# Patient Record
Sex: Male | Born: 1950 | Hispanic: No | Marital: Married | Smoking: Former smoker
Health system: Southern US, Community
[De-identification: ages and names within clinical notes are randomized; demographics above are authoritative.]

## PROBLEM LIST (undated history)

## (undated) DIAGNOSIS — F419 Anxiety disorder, unspecified: Secondary | ICD-10-CM

## (undated) DIAGNOSIS — I10 Essential (primary) hypertension: Secondary | ICD-10-CM

## (undated) DIAGNOSIS — E785 Hyperlipidemia, unspecified: Secondary | ICD-10-CM

## (undated) DIAGNOSIS — I639 Cerebral infarction, unspecified: Secondary | ICD-10-CM

## (undated) DIAGNOSIS — J449 Chronic obstructive pulmonary disease, unspecified: Secondary | ICD-10-CM

## (undated) DIAGNOSIS — T7840XA Allergy, unspecified, initial encounter: Secondary | ICD-10-CM

## (undated) DIAGNOSIS — M1990 Unspecified osteoarthritis, unspecified site: Secondary | ICD-10-CM

## (undated) HISTORY — DX: Cerebral infarction, unspecified: I63.9

## (undated) HISTORY — DX: Hyperlipidemia, unspecified: E78.5

## (undated) HISTORY — DX: Allergy, unspecified, initial encounter: T78.40XA

## (undated) HISTORY — DX: Unspecified osteoarthritis, unspecified site: M19.90

## (undated) HISTORY — DX: Anxiety disorder, unspecified: F41.9

## (undated) HISTORY — PX: FOOT SURGERY: SHX648

## (undated) HISTORY — DX: Essential (primary) hypertension: I10

## (undated) HISTORY — DX: Chronic obstructive pulmonary disease, unspecified: J44.9

---

## 1999-01-02 ENCOUNTER — Emergency Department (HOSPITAL_COMMUNITY): Admission: EM | Admit: 1999-01-02 | Discharge: 1999-01-02 | Payer: Self-pay | Admitting: Emergency Medicine

## 1999-01-22 ENCOUNTER — Emergency Department (HOSPITAL_COMMUNITY): Admission: EM | Admit: 1999-01-22 | Discharge: 1999-01-22 | Payer: Self-pay | Admitting: Emergency Medicine

## 1999-09-11 ENCOUNTER — Encounter: Payer: Self-pay | Admitting: Pulmonary Disease

## 1999-09-11 ENCOUNTER — Ambulatory Visit (HOSPITAL_COMMUNITY): Admission: RE | Admit: 1999-09-11 | Discharge: 1999-09-11 | Payer: Self-pay | Admitting: Pulmonary Disease

## 1999-10-02 ENCOUNTER — Ambulatory Visit (HOSPITAL_COMMUNITY): Admission: RE | Admit: 1999-10-02 | Discharge: 1999-10-02 | Payer: Self-pay | Admitting: Pulmonary Disease

## 1999-10-02 ENCOUNTER — Encounter: Payer: Self-pay | Admitting: Pulmonary Disease

## 1999-10-20 ENCOUNTER — Encounter (INDEPENDENT_AMBULATORY_CARE_PROVIDER_SITE_OTHER): Payer: Self-pay | Admitting: *Deleted

## 1999-10-20 ENCOUNTER — Ambulatory Visit (HOSPITAL_BASED_OUTPATIENT_CLINIC_OR_DEPARTMENT_OTHER): Admission: RE | Admit: 1999-10-20 | Discharge: 1999-10-20 | Payer: Self-pay | Admitting: Urology

## 2000-05-28 ENCOUNTER — Encounter: Payer: Self-pay | Admitting: *Deleted

## 2000-05-28 ENCOUNTER — Emergency Department (HOSPITAL_COMMUNITY): Admission: EM | Admit: 2000-05-28 | Discharge: 2000-05-29 | Payer: Self-pay | Admitting: *Deleted

## 2000-06-25 ENCOUNTER — Encounter: Payer: Self-pay | Admitting: Pulmonary Disease

## 2000-06-25 ENCOUNTER — Ambulatory Visit (HOSPITAL_COMMUNITY): Admission: RE | Admit: 2000-06-25 | Discharge: 2000-06-25 | Payer: Self-pay | Admitting: Pulmonary Disease

## 2000-10-13 ENCOUNTER — Encounter: Admission: RE | Admit: 2000-10-13 | Discharge: 2000-11-24 | Payer: Self-pay | Admitting: Pulmonary Disease

## 2001-01-11 ENCOUNTER — Encounter: Admission: RE | Admit: 2001-01-11 | Discharge: 2001-01-12 | Payer: Self-pay | Admitting: Pulmonary Disease

## 2001-01-13 ENCOUNTER — Encounter: Admission: RE | Admit: 2001-01-13 | Discharge: 2001-01-28 | Payer: Self-pay | Admitting: Pulmonary Disease

## 2002-06-13 ENCOUNTER — Ambulatory Visit (HOSPITAL_COMMUNITY): Admission: RE | Admit: 2002-06-13 | Discharge: 2002-06-13 | Payer: Self-pay | Admitting: Pulmonary Disease

## 2002-06-13 ENCOUNTER — Encounter: Payer: Self-pay | Admitting: Pulmonary Disease

## 2005-04-22 ENCOUNTER — Ambulatory Visit: Payer: Self-pay

## 2007-01-03 ENCOUNTER — Emergency Department (HOSPITAL_COMMUNITY): Admission: EM | Admit: 2007-01-03 | Discharge: 2007-01-03 | Payer: Self-pay | Admitting: Emergency Medicine

## 2009-07-29 ENCOUNTER — Encounter: Admission: RE | Admit: 2009-07-29 | Discharge: 2009-07-29 | Payer: Self-pay | Admitting: Internal Medicine

## 2009-09-13 ENCOUNTER — Encounter: Admission: RE | Admit: 2009-09-13 | Discharge: 2009-09-13 | Payer: Self-pay | Admitting: Diagnostic Neuroimaging

## 2010-10-31 NOTE — Op Note (Signed)
[place]  Patient:    Travis Barajas                       MRN: [id number] Proc. Date: 10/20/99 Adm. Date:  [id number] Disc. Date: [id number] Attending:  Lindaann Slough                           Operative Report  PREOPERATIVE DIAGNOSIS:  Microhematuria.  POSTOPERATIVE DIAGNOSIS:  Microhematuria.  PROCEDURE PERFORMED:  Cystoscopy.  SURGEON:  Lindaann Slough, M.D.  ANESTHESIA:  General.  INDICATIONS:  The patient is a 60 year old male who was found on urinalysis to have microhematuria.  IVP and CT scan of the kidneys showed a diverticulum in the mid pole of the left kidney, but otherwise normal.  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  The patient is scheduled today for cystoscopy under anesthesia.  DESCRIPTION OF PROCEDURE:  Under general anesthesia, the patient was prepped and draped, and placed in the dorsal lithotomy position.  A #20 Wappler cystoscope was inserted in the bladder.  The urethra is normal.  There is no stone or tumor in the bladder.  The ureteral orifices are in normal position and shape with clear efflux.  There was no evidence of submucosal hemorrhage. The bladder was them emptied and irrigated with normal saline and bladder washings were sent for pathology.  The patient tolerated the procedure well and left the OR in satisfactory condition to the postanesthesia care unit. DD:  10/20/99 TD:  10/21/99 Job: [id number]

## 2011-03-14 ENCOUNTER — Ambulatory Visit (HOSPITAL_COMMUNITY)
Admission: RE | Admit: 2011-03-14 | Discharge: 2011-03-14 | Disposition: A | Discharge status: Home / Self Care | Payer: 59 | Source: Ambulatory Visit | Attending: Emergency Medicine | Admitting: Emergency Medicine

## 2011-03-14 ENCOUNTER — Other Ambulatory Visit: Payer: Self-pay | Admitting: Emergency Medicine

## 2011-03-14 DIAGNOSIS — R519 Headache, unspecified: Secondary | ICD-10-CM

## 2011-03-14 DIAGNOSIS — I672 Cerebral atherosclerosis: Secondary | ICD-10-CM | POA: Insufficient documentation

## 2011-03-14 DIAGNOSIS — R51 Headache: Secondary | ICD-10-CM | POA: Insufficient documentation

## 2011-03-16 ENCOUNTER — Other Ambulatory Visit: Payer: Self-pay | Admitting: Orthopedic Surgery

## 2011-03-16 DIAGNOSIS — M542 Cervicalgia: Secondary | ICD-10-CM

## 2011-03-17 ENCOUNTER — Ambulatory Visit
Admission: RE | Admit: 2011-03-17 | Discharge: 2011-03-17 | Disposition: A | Discharge status: Home / Self Care | Payer: 59 | Source: Ambulatory Visit | Attending: Orthopedic Surgery | Admitting: Orthopedic Surgery

## 2011-03-17 DIAGNOSIS — M542 Cervicalgia: Secondary | ICD-10-CM

## 2011-03-30 LAB — ETHANOL: Alcohol, Ethyl (B): 181 — ABNORMAL HIGH

## 2011-04-10 LAB — PSA: PSA: 1.56

## 2011-04-14 ENCOUNTER — Other Ambulatory Visit (HOSPITAL_COMMUNITY): Payer: Self-pay | Admitting: Gastroenterology

## 2011-05-06 ENCOUNTER — Encounter (HOSPITAL_COMMUNITY)
Admission: RE | Admit: 2011-05-06 | Discharge: 2011-05-06 | Disposition: A | Discharge status: Home / Self Care | Payer: 59 | Source: Ambulatory Visit | Attending: Gastroenterology | Admitting: Gastroenterology

## 2011-05-06 DIAGNOSIS — R109 Unspecified abdominal pain: Secondary | ICD-10-CM | POA: Insufficient documentation

## 2011-05-06 MED ORDER — TECHNETIUM TC 99M SULFUR COLLOID
2.1000 | Freq: Once | INTRAVENOUS | Status: AC | PRN
  Administered 2011-05-06: 2.1 via INTRAVENOUS

## 2011-05-21 ENCOUNTER — Ambulatory Visit (INDEPENDENT_AMBULATORY_CARE_PROVIDER_SITE_OTHER): Payer: 59

## 2011-05-21 DIAGNOSIS — L02519 Cutaneous abscess of unspecified hand: Secondary | ICD-10-CM

## 2011-05-21 DIAGNOSIS — M25449 Effusion, unspecified hand: Secondary | ICD-10-CM

## 2011-05-26 ENCOUNTER — Ambulatory Visit (INDEPENDENT_AMBULATORY_CARE_PROVIDER_SITE_OTHER): Payer: 59 | Admitting: Emergency Medicine

## 2011-05-26 DIAGNOSIS — J159 Unspecified bacterial pneumonia: Secondary | ICD-10-CM

## 2011-05-26 DIAGNOSIS — R1084 Generalized abdominal pain: Secondary | ICD-10-CM

## 2011-06-05 ENCOUNTER — Ambulatory Visit (INDEPENDENT_AMBULATORY_CARE_PROVIDER_SITE_OTHER): Payer: 59

## 2011-06-05 DIAGNOSIS — R05 Cough: Secondary | ICD-10-CM

## 2011-06-05 DIAGNOSIS — J9801 Acute bronchospasm: Secondary | ICD-10-CM

## 2011-06-05 DIAGNOSIS — R059 Cough, unspecified: Secondary | ICD-10-CM

## 2011-06-05 DIAGNOSIS — M546 Pain in thoracic spine: Secondary | ICD-10-CM

## 2011-07-23 ENCOUNTER — Ambulatory Visit (INDEPENDENT_AMBULATORY_CARE_PROVIDER_SITE_OTHER): Payer: 59 | Admitting: Family Medicine

## 2011-07-23 ENCOUNTER — Encounter: Payer: Self-pay | Admitting: Family Medicine

## 2011-07-23 VITALS — BP 174/97 | HR 67 | Temp 97.2°F | Resp 16 | Ht 65.0 in | Wt 149.0 lb

## 2011-07-23 DIAGNOSIS — K219 Gastro-esophageal reflux disease without esophagitis: Secondary | ICD-10-CM

## 2011-07-23 DIAGNOSIS — J309 Allergic rhinitis, unspecified: Secondary | ICD-10-CM | POA: Insufficient documentation

## 2011-07-23 DIAGNOSIS — M2550 Pain in unspecified joint: Secondary | ICD-10-CM

## 2011-07-23 DIAGNOSIS — I10 Essential (primary) hypertension: Secondary | ICD-10-CM

## 2011-07-23 NOTE — Progress Notes (Addendum)
  Subjective:    Patient ID: Travis Barajas, male    DOB: [birth year], 61 y.o.   MRN: [id number]  HPI    This 61 y.o Travis Barajas male has malignant Hypertension and had a recent change of medications  by Dr. Jacinto Halim . The pt now has the correct medications as of yesterday. He was having headaches in frontal   area with a little lightheadedness. He is on several chronic meds (a number of them for GERD).  He notes that he has some work-related stress and   Review of Systems  Constitutional: Negative.   Cardiovascular: Negative.   Neurological: Positive for dizziness, light-headedness and headaches. Negative for weakness.       Objective:   Physical Exam  Vitals reviewed. Constitutional: He is oriented to person, place, and time. He appears well-developed and well-nourished.  HENT:  Head: Normocephalic and atraumatic.  Cardiovascular: Normal rate.   Pulmonary/Chest: Effort normal.  Neurological: He is alert and oriented to person, place, and time. No cranial nerve deficit. Coordination normal.  Psychiatric: He has a normal mood and affect. His behavior is normal. Judgment normal.    Blood Pressure Readings at Work:   07/22/2011    R arm:  192/116     p 98                                                                                   L arm :  180/106 BP last  PM:     146/88    Assessment & Plan:  Hypertension- Malignant : continue current medications. Pt is given an excuse from work , today                                           through Tuesday, 07/28/2011, at which time he will return for BP                                           re-check. He will be compliant with all his meds and reduce stress                                            with adequate rest over the next several days.

## 2011-07-28 ENCOUNTER — Encounter: Payer: Self-pay | Admitting: Family Medicine

## 2011-07-28 ENCOUNTER — Ambulatory Visit (INDEPENDENT_AMBULATORY_CARE_PROVIDER_SITE_OTHER): Payer: 59 | Admitting: Family Medicine

## 2011-07-28 VITALS — BP 161/90 | HR 71 | Temp 97.5°F | Resp 16 | Ht 65.0 in | Wt 149.2 lb

## 2011-07-28 DIAGNOSIS — I10 Essential (primary) hypertension: Secondary | ICD-10-CM

## 2011-07-28 MED ORDER — LISINOPRIL-HYDROCHLOROTHIAZIDE 20-25 MG PO TABS: 1.0000 | ORAL_TABLET | Freq: Every day | ORAL | Status: DC

## 2011-07-28 NOTE — Patient Instructions (Signed)
Today, we discussed some changes in your nutritional habits to help reduce your Blood Pressure; namely, caffeine reduction and tobacco use.   Lisinopril- HCTZ 20-25 mg is your new medication dose. Take one tablet daily along with your other medications.

## 2011-07-28 NOTE — Progress Notes (Signed)
  Subjective:    Patient ID: Travis Barajas, male    DOB: 10/08/1950, 61 y.o.   MRN: [id number]  HPI   This 61 y.o.man returns for BP recheck. He feels better since having a few days off and has been  compliant with medications. He does not observe the healthiest diet and consumes too much caffeine  and continues to smoke 1/2 pack daily. He has smoked for 40 years.    Review of Systems  Constitutional: Negative.   Respiratory: Negative.   Cardiovascular: Negative.   Neurological: Negative.        Objective:   Physical Exam  Constitutional: He is oriented to person, place, and time. He appears well-developed and well-nourished. No distress.  HENT:  Head: Normocephalic and atraumatic.  Cardiovascular: Normal rate.   Pulmonary/Chest: Effort normal.  Neurological: He is alert and oriented to person, place, and time. No cranial nerve deficit.  Psychiatric: He has a normal mood and affect.          Assessment & Plan:   1. HTN (hypertension), malignant  lisinopril-hydrochlorothiazide (PRINZIDE,ZESTORETIC) 20-25 MG per tablet - this is your new medication dose. Continune all other meds . RTC on 07/30/2010 for BP recheck.

## 2011-07-31 ENCOUNTER — Encounter: Payer: Self-pay | Admitting: Family Medicine

## 2011-07-31 ENCOUNTER — Ambulatory Visit (INDEPENDENT_AMBULATORY_CARE_PROVIDER_SITE_OTHER): Payer: 59 | Admitting: Family Medicine

## 2011-07-31 VITALS — BP 185/103 | HR 92 | Temp 98.5°F | Resp 20 | Ht 65.0 in | Wt 149.6 lb

## 2011-07-31 DIAGNOSIS — I10 Essential (primary) hypertension: Secondary | ICD-10-CM

## 2011-07-31 DIAGNOSIS — Z720 Tobacco use: Secondary | ICD-10-CM

## 2011-07-31 NOTE — Progress Notes (Signed)
  Subjective:    Patient ID: Travis Barajas, male    DOB: September 17, 1950, 61 y.o.   MRN: [id number]  HPI   The pt returns for BP check. He is taking his medication but has chronic neck pain for which  he takes HC/APAP prescribed by Dr. Buford Dresser, a Spine Specialist. He has been receiving injections  and is due to return to him on Monday- 08/03/11. His took 2 Lisinopril/HCTZ 20-25 tabs this morning  at his wife's insistence. He admits that he snacks on foods that may contain salt; he will try to get  some fruit for snacking instead. He is a smoker but will work on reduction.    Review of Systems   Persistent neck pain; otherwise negative.     Objective:   Physical Exam  Vitals reviewed. Constitutional: He is oriented to person, place, and time. He appears well-developed and well-nourished. No distress.  HENT:  Head: Normocephalic and atraumatic.  Cardiovascular: Normal rate.   Pulmonary/Chest: Effort normal.  Neurological: He is alert and oriented to person, place, and time. No cranial nerve deficit.  Skin: Skin is warm and dry.  Psychiatric: He has a normal mood and affect. His behavior is normal.          Assessment & Plan:  HTN- Increase Metoprolol 50 mg to 1 1/2 tablets twice a day.            RTC 10 days for re-check.  Continue to monitor BP at home.           Continue all other meds as prescribed.

## 2011-07-31 NOTE — Patient Instructions (Addendum)
Your BP medication - Metoprolol 50 mg has been increased to  1 1/2 tablets twice a day. Continue all your other medications. RTC in 10 days for re-check.  Continue to reduce tobacco use. Try E-cigarettes.

## 2011-08-05 ENCOUNTER — Telehealth: Payer: Self-pay

## 2011-08-05 DIAGNOSIS — Z0271 Encounter for disability determination: Secondary | ICD-10-CM

## 2011-08-05 NOTE — Telephone Encounter (Signed)
Requests hand specialist referral. Alt [phone]

## 2011-08-06 NOTE — Telephone Encounter (Signed)
Pt has a follow-up with me to re-evaluate his BP. I can address this at that visit and will have better documentation to share with the specialist.

## 2011-08-06 NOTE — Telephone Encounter (Signed)
Left message stating that when he comes back to f/u with [facility] specialist would be further discussed so there would be more documentation. To CB if he has any questions til then.

## 2011-08-11 ENCOUNTER — Encounter: Payer: Self-pay | Admitting: Family Medicine

## 2011-08-11 ENCOUNTER — Ambulatory Visit (INDEPENDENT_AMBULATORY_CARE_PROVIDER_SITE_OTHER): Payer: 59 | Admitting: Family Medicine

## 2011-08-11 VITALS — BP 147/83 | HR 95 | Temp 97.2°F | Resp 16 | Ht 65.0 in | Wt 153.2 lb

## 2011-08-11 DIAGNOSIS — M1990 Unspecified osteoarthritis, unspecified site: Secondary | ICD-10-CM

## 2011-08-11 DIAGNOSIS — I10 Essential (primary) hypertension: Secondary | ICD-10-CM

## 2011-08-11 NOTE — Patient Instructions (Signed)
Osteoarthritis Osteoarthritis is the most common form of arthritis. It is redness, soreness, and swelling (inflammation) affecting the cartilage. Cartilage acts as a cushion, covering the ends of bones where they meet to form a joint. CAUSES  Over time, the cartilage begins to wear away. This causes bone to rub on bone. This produces pain and stiffness in the affected joints. Factors that contribute to this problem are:  Excessive body weight.   Age.   Overuse of joints.  SYMPTOMS   People with osteoarthritis usually experience joint pain, swelling, or stiffness.   Over time, the joint may lose its normal shape.   Small deposits of bone (osteophytes) may grow on the edges of the joint.   Bits of bone or cartilage can break off and float inside the joint space. This may cause more pain and damage.   Osteoarthritis can lead to depression, anxiety, feelings of helplessness, and limitations on daily activities.  The most commonly affected joints are in the:  Ends of the fingers.   Thumbs.   Neck.   Lower back.   Knees.   Hips.  DIAGNOSIS  Diagnosis is mostly based on your symptoms and exam. Tests may be helpful, including:  X-rays of the affected joint.   A computerized magnetic scan (MRI).   Blood tests to rule out other types of arthritis.   Joint fluid tests. This involves using a needle to draw fluid from the joint and examining the fluid under a microscope.  TREATMENT  Goals of treatment are to control pain, improve joint function, maintain a normal body weight, and maintain a healthy lifestyle. Treatment approaches may include:  A prescribed exercise program with rest and joint relief.   Weight control with nutritional education.   Pain relief techniques such as:   Properly applied heat and cold.   Electric pulses delivered to nerve endings under the skin (transcutaneous electrical nerve stimulation, TENS).   Massage.   Certain supplements. Ask your  caregiver before using any supplements, especially in combination with prescribed drugs.   Medicines to control pain, such as:   Acetaminophen.   Nonsteroidal anti-inflammatory drugs (NSAIDs), such as naproxen.   Narcotic or central-acting agents, such as tramadol. This drug carries a risk of addiction and is generally prescribed for short-term use.   Corticosteroids. These can be given orally or as injection. This is a short-term treatment, not recommended for routine use.   Surgery to reposition the bones and relieve pain (osteotomy) or to remove loose pieces of bone and cartilage. Joint replacement may be needed in advanced states of osteoarthritis.  HOME CARE INSTRUCTIONS  Your caregiver can recommend specific types of exercise. These may include:  Strengthening exercises. These are done to strengthen the muscles that support joints affected by arthritis. They can be performed with weights or with exercise bands to add resistance.   Aerobic activities. These are exercises, such as brisk walking or low-impact aerobics, that get your heart pumping. They can help keep your lungs and circulatory system in shape.   Range-of-motion activities. These keep your joints limber.   Balance and agility exercises. These help you maintain daily living skills.  Learning about your condition and being actively involved in your care will help improve the course of your osteoarthritis. SEEK MEDICAL CARE IF:   You feel hot or your skin turns red.   You develop a rash in addition to your joint pain.   You have an oral temperature above 102 F (38.9 C).  FOR   MORE INFORMATION  National Institute of Arthritis and Musculoskeletal and Skin Diseases: www.niams.nih.gov National Institute on Aging: www.nia.nih.gov American College of Rheumatology: www.rheumatology.org Document Released: 06/01/2005 Document Revised: 02/11/2011 Document Reviewed: 09/12/2009 Exit[employer] Patient Information 2012 [employer]. 

## 2011-08-11 NOTE — Progress Notes (Signed)
  Subjective:    Patient ID: Travis Barajas, male    DOB: [birth year], 61 y.o.   MRN: [id number]  HPI   The pt returns for BP re-check and feels better- no headaches nor dizziness.  He c/o hand pain, bilat and equal, for at least 3 years. 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 Pt's work involves  working with [employer] for 20+ years. He has some swelling in thumb joints and DIP of middle and ring fingers.  He has been taking medication for neck pain and this does relieve hand pain. The pain has not interfered  with his performance at work.    Review of Systems  Constitutional: Negative.   Cardiovascular: Negative.   Musculoskeletal: Positive for joint swelling.  Neurological: Negative.        Objective:   Physical Exam  Vitals reviewed. Constitutional: He is oriented to person, place, and time. He appears well-developed and well-nourished. No distress.  HENT:  Head: Normocephalic and atraumatic.  Cardiovascular: Normal rate.   Pulmonary/Chest: Effort normal. No respiratory distress.  Musculoskeletal: Normal range of motion. He exhibits no edema.       Hands/ Wrists: NL ROM; no swelling or redness noted in any joints. Squeeze test of both hands- negative.  Tinel's test: negative. Grip bilat: Normal  Neurological: He is alert and oriented to person, place, and time. No cranial nerve deficit.          Assessment & Plan:   1. HTN (hypertension), malignant      BP control much improved. Continue current medications.  2. Osteoarthritis                                      Referral to Specialist not necessary; pt to continue NSAIDs prn

## 2011-09-30 ENCOUNTER — Ambulatory Visit (INDEPENDENT_AMBULATORY_CARE_PROVIDER_SITE_OTHER): Payer: 59 | Admitting: Family Medicine

## 2011-09-30 ENCOUNTER — Encounter: Payer: Self-pay | Admitting: Family Medicine

## 2011-09-30 VITALS — BP 199/100 | HR 64 | Temp 98.3°F | Resp 20 | Ht 65.0 in | Wt 153.8 lb

## 2011-09-30 DIAGNOSIS — F419 Anxiety disorder, unspecified: Secondary | ICD-10-CM

## 2011-09-30 DIAGNOSIS — F411 Generalized anxiety disorder: Secondary | ICD-10-CM

## 2011-09-30 DIAGNOSIS — R5381 Other malaise: Secondary | ICD-10-CM

## 2011-09-30 DIAGNOSIS — I10 Essential (primary) hypertension: Secondary | ICD-10-CM

## 2011-09-30 DIAGNOSIS — R5383 Other fatigue: Secondary | ICD-10-CM

## 2011-09-30 LAB — TESTOSTERONE: Testosterone: 533.07 ng/dL (ref 300–890)

## 2011-09-30 MED ORDER — ALPRAZOLAM 0.5 MG PO TABS: 0.5000 mg | ORAL_TABLET | Freq: Three times a day (TID) | ORAL | Status: DC | PRN

## 2011-09-30 NOTE — Patient Instructions (Signed)
Take your BP meds at the same time of day as often as you can. Work on reducing tobacco use and anxiety to help lower your blood pressure.

## 2011-10-01 ENCOUNTER — Encounter: Payer: Self-pay | Admitting: Family Medicine

## 2011-10-01 DIAGNOSIS — I10 Essential (primary) hypertension: Secondary | ICD-10-CM | POA: Insufficient documentation

## 2011-10-01 LAB — COMPREHENSIVE METABOLIC PANEL
ALT: 11 U/L (ref 0–53)
Albumin: 4.1 g/dL (ref 3.5–5.2)
Alkaline Phosphatase: 76 U/L (ref 39–117)
CO2: 27 mEq/L (ref 19–32)
Calcium: 9.3 mg/dL (ref 8.4–10.5)
Chloride: 101 mEq/L (ref 96–112)
Sodium: 140 mEq/L (ref 135–145)

## 2011-10-01 LAB — CBC
Hemoglobin: 15.4 g/dL (ref 13.0–17.0)
MCH: 27.2 pg (ref 26.0–34.0)
MCHC: 33.8 g/dL (ref 30.0–36.0)
MCV: 80.4 fL (ref 78.0–100.0)
Platelets: 127 10*3/uL — ABNORMAL LOW (ref 150–400)
WBC: 9.4 10*3/uL (ref 4.0–10.5)

## 2011-10-01 NOTE — Progress Notes (Signed)
  Subjective:    Patient ID: Travis Barajas, male    DOB: 02-Dec-1950, 61 y.o.   MRN: [id number]  HPI This 61 y.o male with difficult to control Essential Benign HTN returns for BP check. He states  compliance with meds but has not taken them today. Because of his work schedule, medication  administration times vary. He gets lower readings when he checks BP away from M.D.'s office.  He denies HA, CP, dizziness,  Lightheadedness, syncope  or weakness. He does have some  stressors in his personal life and notes that his only son has joined the [employer]. He does not enjoy  a close relationship with his son.    Review of Systems As per HPI; c/o Fatigue    Objective:   Physical Exam  Vitals reviewed. Constitutional: He is oriented to person, place, and time. He appears well-developed and well-nourished. No distress.  HENT:  Head: Normocephalic and atraumatic.  Eyes: EOM are normal. No scleral icterus.  Cardiovascular: Normal rate, regular rhythm and normal heart sounds.   Pulmonary/Chest: Effort normal and breath sounds normal. No respiratory distress.  Abdominal: Soft. Bowel sounds are normal. He exhibits no distension and no mass. There is no tenderness. There is no guarding.  Musculoskeletal: He exhibits no edema.  Neurological: He is alert and oriented to person, place, and time. No cranial nerve deficit.  Skin: Skin is warm and dry.  Psychiatric: He has a normal mood and affect. His behavior is normal. Thought content normal.          Assessment & Plan:   1. HTN (hypertension)  Comprehensive metabolic panel  Continue chronic meds  2. Anxiety  RF Alprazolam  3. Fatigue  CBC, Testosterone, Vitamin D, 25-hydroxy

## 2011-10-04 ENCOUNTER — Other Ambulatory Visit: Payer: Self-pay | Admitting: Family Medicine

## 2011-10-04 DIAGNOSIS — D696 Thrombocytopenia, unspecified: Secondary | ICD-10-CM

## 2011-10-04 NOTE — Progress Notes (Signed)
Quick Note:  Please call pt and advise that the following labs are abnormal...   Blood chemistries ( electrolytes, blood sugar,kidney and liver tests) are all normal.  Complete blood counts show a below normal platelet count ( platelets help the blood to clot). I do not have a previous value for comparison so I would like to recheck this in 1 month. Future order has been placed; if pt does not come in to have lab drawn next month, it will be done at follow-up visit in 2 months.   Copy to pt. ______

## 2011-10-06 ENCOUNTER — Other Ambulatory Visit (INDEPENDENT_AMBULATORY_CARE_PROVIDER_SITE_OTHER): Payer: 59 | Admitting: *Deleted

## 2011-10-06 DIAGNOSIS — D696 Thrombocytopenia, unspecified: Secondary | ICD-10-CM

## 2011-10-07 LAB — CBC
HCT: 44.2 % (ref 39.0–52.0)
MCH: 27.7 pg (ref 26.0–34.0)
RDW: 14.7 % (ref 11.5–15.5)
WBC: 8.5 10*3/uL (ref 4.0–10.5)

## 2011-10-09 NOTE — Progress Notes (Signed)
Quick Note:  Please call pt and advise that the following labs are abnormal...  Platelet count is still low. This should be checked again in 2 months at next visit. Causes of this can include some medications and chronic alcohol use. If pt notices any abnormal bleeding, he should schedule an appointment to be seen immediately. ______

## 2011-10-10 ENCOUNTER — Telehealth: Payer: Self-pay

## 2011-10-10 MED ORDER — LEVOCETIRIZINE DIHYDROCHLORIDE 5 MG PO TABS: 5.0000 mg | ORAL_TABLET | Freq: Every day | ORAL | Status: DC

## 2011-10-10 NOTE — Telephone Encounter (Signed)
Pt needs a RF of Xyzal 5mg  qd sent to [facility] in [place] on [address]

## 2011-10-10 NOTE — Telephone Encounter (Signed)
Pt notified. But Dr. Audria Nine I spoke with wife and she said that pt's energy level is very low and wants to know if there is anything he can do to increase it. Esp at work because it is sometimes affecting his job. Pt says it's ok to [facility]

## 2011-10-10 NOTE — Telephone Encounter (Signed)
Pt is on a lot of medications, some of which can cause drowsiness. The medication for allergies (generic Xyzal) could be causing it as well as Phenergan, Reglan (metochlopramide), Vesicare (the med for bladder problems). I would suggest He stop Phenergan and Reglan (he is probably taking these for reflux) if he can as well as the allergy medication and see if his energy improves.

## 2011-10-10 NOTE — Telephone Encounter (Signed)
Done

## 2011-10-10 NOTE — Telephone Encounter (Signed)
Pt notified. Pt  

## 2011-10-10 NOTE — Telephone Encounter (Signed)
LMOM notifying patient below. 

## 2011-12-03 ENCOUNTER — Ambulatory Visit: Payer: 59 | Admitting: Family Medicine

## 2011-12-04 ENCOUNTER — Encounter: Payer: Self-pay | Admitting: Family Medicine

## 2011-12-04 ENCOUNTER — Ambulatory Visit (INDEPENDENT_AMBULATORY_CARE_PROVIDER_SITE_OTHER): Payer: 59 | Admitting: Family Medicine

## 2011-12-04 VITALS — BP 180/91 | HR 66 | Temp 97.0°F | Resp 16 | Ht 64.5 in | Wt 150.0 lb

## 2011-12-04 DIAGNOSIS — Z720 Tobacco use: Secondary | ICD-10-CM

## 2011-12-04 DIAGNOSIS — B354 Tinea corporis: Secondary | ICD-10-CM

## 2011-12-04 DIAGNOSIS — F17200 Nicotine dependence, unspecified, uncomplicated: Secondary | ICD-10-CM

## 2011-12-04 DIAGNOSIS — I10 Essential (primary) hypertension: Secondary | ICD-10-CM

## 2011-12-04 MED ORDER — FLUCONAZOLE 150 MG PO TABS: ORAL_TABLET | ORAL | Status: DC

## 2011-12-04 MED ORDER — DOXAZOSIN MESYLATE 2 MG PO TABS: ORAL_TABLET | ORAL | Status: DC

## 2011-12-04 MED ORDER — LEVOCETIRIZINE DIHYDROCHLORIDE 5 MG PO TABS: 5.0000 mg | ORAL_TABLET | Freq: Every day | ORAL | Status: DC

## 2011-12-04 MED ORDER — LISINOPRIL-HYDROCHLOROTHIAZIDE 20-25 MG PO TABS: 1.0000 | ORAL_TABLET | Freq: Every day | ORAL | Status: DC

## 2011-12-05 ENCOUNTER — Encounter: Payer: Self-pay | Admitting: Family Medicine

## 2011-12-05 DIAGNOSIS — Z720 Tobacco use: Secondary | ICD-10-CM | POA: Insufficient documentation

## 2011-12-05 NOTE — Progress Notes (Signed)
  Subjective:    Patient ID: Travis Barajas, male    DOB: 1951-01-05, 61 y.o.   MRN: [id number]  HPI  Pt here for HTN follow-up. He has not had all BP meds today; does not check BP at home or  when not at medical facility. He remains unconcerned about inadequate BP control. He denies HA,CP,  SOB, palpitations, dizziness or edema. He continues to smoke daily- not interested in quitting.   Review of medications indicates that updating and discontinuing some medications is needed.   He has a rash on trunk, gluteal area and axillae which is itching. Started in groin area and he treated it   with OTC anti-fungal- not resolved. Rash seemed to spread to buttock area then to armpits. No change  in diet, hygiene products, or new clothing (that he can recall). Also tried OTC Benadryl though he takes  generic Xyzal most days of the week for allergies.    Review of Systems  Constitutional: Negative for activity change, appetite change and fatigue.  Eyes: Negative for visual disturbance.  Respiratory: Negative for cough, chest tightness and shortness of breath.   Cardiovascular: Negative for chest pain, palpitations and leg swelling.  Gastrointestinal: Negative for abdominal pain.  Neurological: Negative for dizziness, syncope, weakness, light-headedness, numbness and headaches.  Psychiatric/Behavioral: Positive for disturbed wake/sleep cycle. Negative for confusion and dysphoric mood.      Objective:   Physical Exam  Nursing note and vitals reviewed. Constitutional: He is oriented to person, place, and time. He appears well-developed and well-nourished. No distress.  HENT:  Head: Normocephalic and atraumatic.  Right Ear: External ear normal.  Left Ear: External ear normal.  Eyes: Conjunctivae and EOM are normal.       Muddy sclerae  Neck: No JVD present. No thyromegaly present.  Cardiovascular: Normal rate, regular rhythm and normal heart sounds.   Pulmonary/Chest: Effort normal. No  respiratory distress.  Musculoskeletal: Normal range of motion. He exhibits no edema.  Neurological: He is alert and oriented to person, place, and time. No cranial nerve deficit. Coordination normal.  Skin: Skin is warm and dry.       Groin area/ between upper buttocks/ axillae: hyperpigmented well-circumscribed lesions  Psychiatric: He has a normal mood and affect. His behavior is normal. Thought content normal.          Assessment & Plan:   1. HTN (hypertension), malignant  lisinopril-hydrochlorothiazide (PRINZIDE,ZESTORETIC) 20-25 MG per tablet RF x 1 year RX: Doxazosin 2 mg tablet   1st week: 1/2 tab hs then        Increase to 1 tab hs   2. Tinea corporis  RX: Diflucan 150 mg  1 tab daily; continue to use topical sparingly   # 14 with 1 RF  3. Tobacco user  Advised to seriously consider cessation

## 2011-12-29 ENCOUNTER — Ambulatory Visit: Payer: 59 | Admitting: Family Medicine

## 2012-01-28 ENCOUNTER — Ambulatory Visit (INDEPENDENT_AMBULATORY_CARE_PROVIDER_SITE_OTHER): Payer: 59 | Admitting: Emergency Medicine

## 2012-01-28 VITALS — BP 180/92 | HR 80 | Temp 98.3°F | Resp 18 | Ht 65.0 in | Wt 154.0 lb

## 2012-01-28 DIAGNOSIS — I10 Essential (primary) hypertension: Secondary | ICD-10-CM

## 2012-01-28 DIAGNOSIS — M25549 Pain in joints of unspecified hand: Secondary | ICD-10-CM

## 2012-01-28 DIAGNOSIS — M2550 Pain in unspecified joint: Secondary | ICD-10-CM

## 2012-01-28 LAB — COMPREHENSIVE METABOLIC PANEL
AST: 16 U/L (ref 0–37)
Albumin: 3.8 g/dL (ref 3.5–5.2)
CO2: 32 mEq/L (ref 19–32)
Calcium: 8.9 mg/dL (ref 8.4–10.5)
Creat: 0.82 mg/dL (ref 0.50–1.35)
Glucose, Bld: 78 mg/dL (ref 70–99)
Potassium: 3.9 mEq/L (ref 3.5–5.3)
Sodium: 142 mEq/L (ref 135–145)
Total Protein: 6.4 g/dL (ref 6.0–8.3)

## 2012-01-28 LAB — CBC WITH DIFFERENTIAL/PLATELET
Basophils Absolute: 0 10*3/uL (ref 0.0–0.1)
Basophils Relative: 0 % (ref 0–1)
Eosinophils Absolute: 0.3 10*3/uL (ref 0.0–0.7)
Lymphs Abs: 2.7 10*3/uL (ref 0.7–4.0)
MCH: 27.2 pg (ref 26.0–34.0)
Neutrophils Relative %: 63 % (ref 43–77)
RBC: 5.15 MIL/uL (ref 4.22–5.81)
RDW: 15.3 % (ref 11.5–15.5)
WBC: 9.7 10*3/uL (ref 4.0–10.5)

## 2012-01-28 LAB — POCT SEDIMENTATION RATE: POCT SED RATE: 5 mm/hr (ref 0–22)

## 2012-01-28 LAB — RHEUMATOID FACTOR: Rhuematoid fact SerPl-aCnc: <10 IU/mL (ref <=14)

## 2012-01-28 LAB — TSH: TSH: 1.86 u[IU]/mL (ref 0.350–4.500)

## 2012-01-28 MED ORDER — NAPROXEN 500 MG PO TABS: 500.0000 mg | ORAL_TABLET | Freq: Two times a day (BID) | ORAL | Status: DC

## 2012-01-28 MED ORDER — HYDROCODONE-ACETAMINOPHEN 5-325 MG PO TABS: 1.0000 | ORAL_TABLET | Freq: Three times a day (TID) | ORAL | Status: DC | PRN

## 2012-01-28 NOTE — Progress Notes (Signed)
Subjective:    Patient ID: Travis Barajas, male    DOB: 1950-10-16, 61 y.o.   MRN: [id number]  HPIThis 61 y.o. male presents for evaluation of joint pains chronic and need for medication refills. +Chronic pain in elbows, shoulders, knees.  Ran out of medication; was taking Naproxen and Hydrocodone.  Last rx prescribed 10/2011 by ortho for both; last filled 10/2011.  S/p xrays of all joints; s/p ortho consultation Dr. Charlett Blake and diagnosed with OA.  Cervical DDD s/p steroid injections x 2; some improvement in neck pain.  Taking Naproxen once daily usually; rarely will need bid.  Using hydrocodone once daily on average.  Autoimmune labs negative in past 2011 except for ANA slightly positive.  Intermittent joint pain; has worsened and needs refills.  +swelling in R hand.  Will have intermittent knee pain.  +popping in elbows.  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  Chronic B hand pain; s/p ortho consult in 10/2011; ortho recommended hand surgeon evaluation/referral.  Pt non-compliant with hand surgery referral.  Has worn wrist splints at work without improvement.  Has been working long shifts; thus, hand, elbow, and wrist pains have worsened.  Has missed two days of work; needs work note.  No previous rheumatology consultation.  Last hand xray 10/2011 by ortho.  +stiffness in am.  Hypertension:  Uncontrolled chronically; taking Metoprolol once daily instead of bid.  Forgets medications 2-3 days per week on average.  Works 12 hour shifts five days per week; makes medications; often forgets medications.  Denies chest pain, palpitations, shortness of breath, leg swelling.  Non-compliant with follow-up with Aud[facility] last month.  Needs to make appointment.      Review of Systems  Constitutional: Negative for fever, chills and fatigue.  Respiratory: Negative for cough, chest tightness and shortness of breath.   Cardiovascular: Negative  for chest pain, palpitations and leg swelling.  Musculoskeletal: Positive for joint swelling and arthralgias. Negative for myalgias.  Skin: Negative for color change and rash.  Neurological: Negative for dizziness, facial asymmetry, weakness, light-headedness, numbness and headaches.    No past medical history on file.  No past surgical history on file.  Prior to Admission medications   Medication Sig Start Date End Date Taking? Authorizing Provider  albuterol (PROVENTIL HFA;VENTOLIN HFA) 108 (90 BASE) MCG/ACT inhaler Inhale 2 puffs into the lungs every 6 (six) hours as needed.   Yes Historical Provider, MD  ALPRAZolam Prudy Feeler) 0.5 MG tablet Take 1 tablet (0.5 mg total) by mouth 3 (three) times daily as needed for anxiety. 09/30/11  Yes Maurice March, MD  budesonide-formoterol [facility]) 160-4.5 MCG/ACT inhaler Inhale 2 puffs into the lungs 2 (two) times daily.   Yes Historical Provider, MD  cholecalciferol (VITAMIN D) 1000 UNITS tablet Take 1,000 Units by mouth daily.   Yes Historical Provider, MD  HYDROcodone-acetaminophen (NORCO/VICODIN) 5-325 MG per tablet Take 1 tablet by mouth every 8 (eight) hours as needed. 01/28/12  Yes Ethelda Chick, MD  levocetirizine (XYZAL) 5 MG tablet Take 1 tablet (5 mg total) by mouth daily. 12/04/11  Yes Maurice March, MD  lisinopril-hydrochlorothiazide (PRINZIDE,ZESTORETIC) 20-25 MG per tablet Take 1 tablet by mouth daily. 12/04/11 12/03/12 Yes Maurice March, MD  metoprolol (LOPRESSOR) 50 MG tablet Take 50 mg by mouth 2 (two) times daily.   Yes Historical Provider, MD  naproxen (NAPROSYN) 500 MG tablet Take 1 tablet (500 mg total) by mouth 2 (two) times daily with  a meal. 01/28/12  Yes Ethelda Chick, MD  cilostazol (PLETAL) 100 MG tablet Take 100 mg by mouth 2 (two) times daily.    Historical Provider, MD  doxazosin (CARDURA) 2 MG tablet Take 1/2 tablet at bedtime for 1 week then increase to 1 tablet at bedtime. 12/04/11   Maurice March, MD    fluconazole (DIFLUCAN) 150 MG tablet Take 1 tablet  by mouth once a day. 12/04/11   Maurice March, MD  fluticasone [facility]) 50 MCG/ACT nasal spray Place 2 sprays into the nose daily.    Historical Provider, MD  metoCLOPramide (REGLAN) 5 MG tablet Take 5 mg by mouth 4 (four) times daily.    Historical Provider, MD  mometasone (NASONEX) 50 MCG/ACT nasal spray Place 2 sprays into the nose daily.    Historical Provider, MD  pantoprazole (PROTONIX) 40 MG tablet Take 40 mg by mouth daily.    Historical Provider, MD  promethazine (PHENERGAN) 25 MG tablet Take 25 mg by mouth every 6 (six) hours as needed.    Historical Provider, MD    No Known Allergies  History   Social History  . Marital Status: Married    Spouse Name: N/A    Number of Children: N/A  . Years of Education: N/A   Occupational History  . Not on file.   Social History Main Topics  . Smoking status: Current Everyday Smoker -- 0.5 packs/day for 40 years    Types: Cigarettes  . Smokeless tobacco: Not on file  . Alcohol Use: Yes     occs  . Drug Use: No  . Sexually Active: Not on file   Other Topics Concern  . Not on file   Social History Narrative  . No narrative on file    No family history on file.     Objective:   Physical Exam  Nursing note and vitals reviewed. Constitutional: He is oriented to person, place, and time. He appears well-developed and well-nourished. No distress.  HENT:  Head: Normocephalic and atraumatic.  Eyes: EOM are normal. Pupils are equal, round, and reactive to light.  Neck: Normal range of motion. Neck supple.  Cardiovascular: Normal rate, regular rhythm, normal heart sounds and intact distal pulses.  Exam reveals no gallop and no friction rub.   No murmur heard.      TRACE PITTING EDEMA.  Pulmonary/Chest: Effort normal and breath sounds normal.  Musculoskeletal:       Right shoulder: He exhibits normal range of motion, no tenderness, no bony tenderness, no swelling, no  effusion, no crepitus and no deformity.       Left shoulder: He exhibits normal range of motion, no tenderness, no bony tenderness, no swelling, no crepitus, no deformity and normal strength.       Right elbow: He exhibits decreased range of motion. He exhibits no swelling, no effusion and no deformity. tenderness found. Lateral epicondyle tenderness noted.       Left elbow: He exhibits decreased range of motion. He exhibits no swelling and no deformity. tenderness found. Lateral epicondyle tenderness noted.       Right wrist: He exhibits decreased range of motion. He exhibits no tenderness, no swelling, no effusion and no deformity.       Left wrist: He exhibits decreased range of motion. He exhibits no tenderness, no bony tenderness and no swelling.       Right knee: He exhibits normal range of motion, no swelling and no effusion.  Left knee: He exhibits normal range of motion, no swelling and no effusion. no tenderness found.       R HAND:  MILD SWELLING OF HAND; +TTP DISTAL METACARPAL 3RD DIGIT; +SWELLING 3RD DIGIT PIP, DIP JOINTS WITH DECREASED EXTENSION.  GRIP 5/5 BUE.    Neurological: He is alert and oriented to person, place, and time.  Skin: Skin is warm and dry. No rash noted. He is not diaphoretic. No erythema.  Psychiatric: He has a normal mood and affect. His behavior is normal. Judgment and thought content normal.          Assessment & Plan:   1. Arthralgia of multiple sites, bilateral  CBC with Differential, POCT SEDIMENTATION RATE, Rheumatoid factor, Uric acid, CK, ANA  2. Hand joint pain    3. Hypertension  Comprehensive metabolic panel, TSH      1.  R hand pain: persistent/recurrent.  Repetitive hand/wrist movements at work worsening chronic wrist/hand pain.  S/p ortho consult 10/2011 with xrays; referral to hand surgery made but patient non-compliant with referral.  Refills of Naproxen and Hydrocodone provided; work excuse for next four days provided.  Offered  referral to hand surgery today but patient declined. 2.  Arthralgias: chronic.  B shoulders, elbows, knees.  S/p ortho consult multiple times in past two years.  S/p autoimmune labs with only slightly positive ANA; no previous rheumatology consultation.  Recommended rheumatology referral to rule out autoimmune process but pt declined; agreeable to undergoing repeat autoimmune labs.  Refill of medication provided. 3.  HTN:  Uncontrolled; pt non-compliant with daily medications; emphasized importance of compliance with medications; advised to increase Metoprolol 50mg  to bid as previously prescribed.

## 2012-01-28 NOTE — Patient Instructions (Addendum)
1. Arthralgia of multiple sites, bilateral  CBC with Differential, POCT SEDIMENTATION RATE, Rheumatoid factor, Uric acid, CK, ANA  2. Hand joint pain    3. Hypertension  Comprehensive metabolic panel, TSH   I have refilled prescriptions for Naproxen and Hydrocodone.   You will be called with lab results. Recommend evaluation by hand surgeon and rheumatologist in future.  Recommend increasing Metoprolol to one tablet twice daily.

## 2012-01-29 LAB — ANA: Anti Nuclear Antibody(ANA): POSITIVE — AB

## 2012-02-04 ENCOUNTER — Encounter: Payer: Self-pay | Admitting: Family Medicine

## 2012-02-04 ENCOUNTER — Ambulatory Visit (INDEPENDENT_AMBULATORY_CARE_PROVIDER_SITE_OTHER): Payer: 59 | Admitting: Family Medicine

## 2012-02-04 VITALS — BP 199/100 | HR 91 | Temp 98.4°F | Resp 17 | Ht 65.0 in | Wt 151.0 lb

## 2012-02-04 DIAGNOSIS — Z9114 Patient's other noncompliance with medication regimen: Secondary | ICD-10-CM

## 2012-02-04 DIAGNOSIS — I10 Essential (primary) hypertension: Secondary | ICD-10-CM

## 2012-02-04 DIAGNOSIS — Z9119 Patient's noncompliance with other medical treatment and regimen: Secondary | ICD-10-CM

## 2012-02-04 DIAGNOSIS — Z91148 Patient's other noncompliance with medication regimen for other reason: Secondary | ICD-10-CM

## 2012-02-04 NOTE — Patient Instructions (Signed)
Please take your medications daily as prescribed. Also try some of the nonmedicinal anti-inflammatory strategies like regular meals based on whole foods and using some spices to help reduce your pain. Consider Tai- Chi to help lower your Blood Pressure and reduce pain.

## 2012-02-04 NOTE — Progress Notes (Signed)
  Subjective:    Patient ID: Travis Barajas, male    DOB: 02-19-1951, 61 y.o.   MRN: [id number]  HPI This 61 y.o. Male has HTN and long history of non-compliance. He says he is not taking his  BP meds because of focusing on chronic pain. He also has stress in his personal life.  He continues to smoke.   Pt is not taking bedtime dose of Cardura . He has not had any BP meds today. He is taking pain  medication and sleeping a lot. He skips breakfast everyday and sometimes only eats once a day.   He states he has not been back to work after being seen at [facility] on 01/28/12 by Dr. Katrinka Blazing.    Review of Systems  Constitutional: Positive for appetite change and fatigue. Negative for fever.  Eyes: Negative for visual disturbance.  Respiratory: Negative for chest tightness and shortness of breath.   Cardiovascular: Negative for chest pain and palpitations.  Musculoskeletal: Positive for back pain and arthralgias.  Neurological: Negative for dizziness, syncope, weakness, light-headedness, numbness and headaches.  Psychiatric/Behavioral: Positive for disturbed wake/sleep cycle and decreased concentration.       Objective:   Physical Exam  Vitals reviewed. Constitutional: He is oriented to person, place, and time. He appears well-developed and well-nourished. No distress.  HENT:  Head: Normocephalic and atraumatic.  Eyes: Conjunctivae are normal. No scleral icterus.  Cardiovascular: Normal rate, regular rhythm and normal heart sounds.   Pulmonary/Chest: Effort normal. No respiratory distress.  Musculoskeletal: Normal range of motion.  Neurological: He is alert and oriented to person, place, and time. No cranial nerve deficit.  Skin: Skin is warm and dry.  Psychiatric: He has a normal mood and affect. His behavior is normal.       Thought content: noncompliance suggests lack of insight or mild to moderate depression due to chronic pain.          Assessment & Plan:   1. HTN  (hypertension), malignant - strongly encouraged pt to take medications as prescribed; Discussed with him the consequences of continued noncompliance (stroke, MI, etc.)  2. Noncompliance with medications- reviewed medications and dosing.   With regards to chronic joint pain: pt has medication; I have advised an anti-inflammatory nutrition, focusing on whole foods and using spices (i.e. Tumeric and ginger) to combat and reduce inflammation. Also suggested Arnica gel which can be applied topically to painful areas.  RTC in 1 week; pt needs note for work absence. I advised him that I can only cover dates pertaining  to my evaluation (not covering absence since 01/28/12 visit). A letter was sent by Dr. Kevin Fenton at last visit.

## 2012-02-09 ENCOUNTER — Telehealth: Payer: Self-pay

## 2012-02-09 NOTE — Telephone Encounter (Signed)
I have gotten him a note that he can work his full duty job. I have advised him I can not provide him a note to excuse him from any work.
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
## 2012-02-09 NOTE — Telephone Encounter (Signed)
His note in computer indicates he was to return to work on August 19th, but patient indicating he wants note to return tomorrow. Please advise.

## 2012-03-11 ENCOUNTER — Telehealth: Payer: Self-pay

## 2012-03-11 NOTE — Telephone Encounter (Signed)
PATIENTS SPOUSE HAS CALLED ASKING IF WE COULD FAX OVER HER HUSBANDS MR RECORDS TO A SPECIALIST AT FAX: [phone]). PATIENTS WIFE STATES THAT A DR. AT [facility] RECOMMENDED THEM TO SEE A SPECIALIST. VERIFIED WITH PATIENT IF THIS WAS A REFERRAL THAT WAS BEING DONE AND THEY STATED NO? THEY ARE NOT SURE. PLEASE VERIFY WITH PATIENT AND FAX RECORDS. THANK YOU!

## 2012-03-11 NOTE — Telephone Encounter (Signed)
If patient calls back this is why we can not do this request.  We can not release records if we did not refer the patient to the specialist.  I do not see in Epic where we did a referral for this, so patient will need to come back in to sign a release.

## 2012-06-15 DIAGNOSIS — Z0271 Encounter for disability determination: Secondary | ICD-10-CM

## 2012-08-24 ENCOUNTER — Ambulatory Visit (INDEPENDENT_AMBULATORY_CARE_PROVIDER_SITE_OTHER): Payer: 59 | Admitting: Family Medicine

## 2012-08-24 VITALS — BP 170/98 | HR 77 | Temp 98.0°F | Resp 16 | Ht 65.5 in | Wt 163.0 lb

## 2012-08-24 DIAGNOSIS — G9001 Carotid sinus syncope: Secondary | ICD-10-CM

## 2012-08-24 DIAGNOSIS — M2550 Pain in unspecified joint: Secondary | ICD-10-CM

## 2012-08-24 DIAGNOSIS — I10 Essential (primary) hypertension: Secondary | ICD-10-CM

## 2012-08-24 DIAGNOSIS — J309 Allergic rhinitis, unspecified: Secondary | ICD-10-CM

## 2012-08-24 DIAGNOSIS — R413 Other amnesia: Secondary | ICD-10-CM

## 2012-08-24 LAB — COMPREHENSIVE METABOLIC PANEL
ALT: 14 U/L (ref 0–53)
CO2: 27 mEq/L (ref 19–32)
Calcium: 9.2 mg/dL (ref 8.4–10.5)
Chloride: 105 mEq/L (ref 96–112)
Creat: 0.79 mg/dL (ref 0.50–1.35)
Glucose, Bld: 88 mg/dL (ref 70–99)
Sodium: 140 mEq/L (ref 135–145)
Total Protein: 7.2 g/dL (ref 6.0–8.3)

## 2012-08-24 LAB — POCT CBC
Hemoglobin: 14.3 g/dL (ref 14.1–18.1)
Lymph, poc: 1.6 (ref 0.6–3.4)
MCH, POC: 27 pg (ref 27–31.2)
MCHC: 32.1 g/dL (ref 31.8–35.4)
MID (cbc): 0.4 (ref 0–0.9)
MPV: 14 fL (ref 0–99.8)
POC Granulocyte: 5 (ref 2–6.9)
POC LYMPH PERCENT: 23.2 %L (ref 10–50)
POC MID %: 5.9 %M (ref 0–12)
Platelet Count, POC: 147 10*3/uL (ref 142–424)
RDW, POC: 15.7 %
WBC: 7.1 10*3/uL (ref 4.6–10.2)

## 2012-08-24 LAB — RHEUMATOID FACTOR: Rhuematoid fact SerPl-aCnc: <10 IU/mL (ref <=14)

## 2012-08-24 LAB — POCT SEDIMENTATION RATE: POCT SED RATE: 22 mm/hr (ref 0–22)

## 2012-08-24 MED ORDER — DOXAZOSIN MESYLATE 2 MG PO TABS: ORAL_TABLET | ORAL | Status: DC

## 2012-08-24 MED ORDER — METOPROLOL TARTRATE 50 MG PO TABS: 50.0000 mg | ORAL_TABLET | Freq: Two times a day (BID) | ORAL | Status: DC

## 2012-08-24 MED ORDER — DICLOFENAC SODIUM 75 MG PO TBEC: 75.0000 mg | DELAYED_RELEASE_TABLET | Freq: Two times a day (BID) | ORAL | Status: DC

## 2012-08-24 MED ORDER — LEVOCETIRIZINE DIHYDROCHLORIDE 5 MG PO TABS: 5.0000 mg | ORAL_TABLET | Freq: Every day | ORAL | Status: DC

## 2012-08-24 NOTE — Patient Instructions (Addendum)
Followup with either me here at the [facility] office, or see one of the others on a scheduled appointment at [facility] in about 4-6 months to followup on the memory issue  Rheumatology referral will be made  Carotid ultrasound will be ordered  Take the diclofenac one twice daily with food for the pain and inflammation in the neck. It should help your other joints also. If it upsets your stomach please discontinue it.  Continue your other medicines as previously ordered  I will let you know the results of your other labs in a few days.
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
## 2012-08-25 LAB — ANA: Anti Nuclear Antibody(ANA): POSITIVE — AB

## 2012-08-26 ENCOUNTER — Encounter: Payer: Self-pay | Admitting: Family Medicine

## 2012-08-26 ENCOUNTER — Other Ambulatory Visit: Payer: Self-pay | Admitting: Emergency Medicine

## 2012-08-26 NOTE — Telephone Encounter (Signed)
Called refill for Alprazolam to pt's pharmacy.

## 2012-09-28 ENCOUNTER — Ambulatory Visit (INDEPENDENT_AMBULATORY_CARE_PROVIDER_SITE_OTHER): Payer: 59 | Admitting: Family Medicine

## 2012-09-28 VITALS — BP 195/95 | HR 79 | Temp 97.1°F | Resp 16 | Ht 65.0 in | Wt 161.0 lb

## 2012-09-28 DIAGNOSIS — I10 Essential (primary) hypertension: Secondary | ICD-10-CM

## 2012-09-28 DIAGNOSIS — M2550 Pain in unspecified joint: Secondary | ICD-10-CM

## 2012-09-28 DIAGNOSIS — M249 Joint derangement, unspecified: Secondary | ICD-10-CM

## 2012-09-28 DIAGNOSIS — M1990 Unspecified osteoarthritis, unspecified site: Secondary | ICD-10-CM

## 2012-09-28 MED ORDER — TRAMADOL HCL 50 MG PO TABS: 50.0000 mg | ORAL_TABLET | Freq: Three times a day (TID) | ORAL | Status: DC | PRN

## 2012-09-28 MED ORDER — METOPROLOL TARTRATE 100 MG PO TABS: 100.0000 mg | ORAL_TABLET | Freq: Two times a day (BID) | ORAL | Status: DC

## 2012-09-28 NOTE — Patient Instructions (Addendum)
Continue your current blood pressure medicines except increase the metoprolol to 100 mg (2x50) twice daily. The new prescription will be for the 100 mg pills so you will only need to take one twice daily.  Continue taking the diclofenac twice daily as needed for joint and muscle pains.  Take tramadol one twice daily only when needed for bad pain. Take it with a little food. Actually if you take one tramadol plus one Tylenol at the same time the combination makes it a stronger pain reliever.  Return one more time in 1 month for me to followup again on the blood pressure until we get it to a good comfortable level. We will see how you joints at that time.

## 2012-09-28 NOTE — Progress Notes (Signed)
Subjective: Patient to see the rheumatologist since his last year. His pain in his neck subsided, and he canceled his carotid ultrasound. He saw the rheumatologist, who did some additional tests, said they couldn't find anything, and did not make any medicine changes. Assessment post viral for that but I don't see that. We will try to treat it.  He's worried about his blood pressure because it continues to stay high. He thinks that taking the medications for his arthritis keeps his blood pressure high.  He is not not able to work at this time. He was terminated from his previous job. He had missed a lot of work because of his joints.  Objective: Blood pressure is noted. He has pain and decreased strength in both of his elbows due to the prescription of the right hand is not as good as the left. Knees are fair right now.  Assessment: Hypertension unsatisfactory control Polyarthritis  Plan: We'll try to get the notes from the rheumatologist. Notes were received and reviewed. Will increase his metoprolol to 100 mg twice a day  We will see how tramadol does for additional pain relief for his joints. He did take a Tylenol with the tramadol to make it more potent pain reliever. Return in one month for recheck.

## 2012-10-18 ENCOUNTER — Ambulatory Visit (INDEPENDENT_AMBULATORY_CARE_PROVIDER_SITE_OTHER): Payer: 59 | Admitting: Family Medicine

## 2012-10-18 ENCOUNTER — Ambulatory Visit: Payer: 59

## 2012-10-18 VITALS — BP 189/94 | HR 67 | Temp 98.0°F | Resp 16 | Ht 65.0 in | Wt 165.0 lb

## 2012-10-18 DIAGNOSIS — J441 Chronic obstructive pulmonary disease with (acute) exacerbation: Secondary | ICD-10-CM

## 2012-10-18 DIAGNOSIS — R0602 Shortness of breath: Secondary | ICD-10-CM | POA: Insufficient documentation

## 2012-10-18 DIAGNOSIS — R0609 Other forms of dyspnea: Secondary | ICD-10-CM

## 2012-10-18 DIAGNOSIS — R0989 Other specified symptoms and signs involving the circulatory and respiratory systems: Secondary | ICD-10-CM

## 2012-10-18 DIAGNOSIS — R0600 Dyspnea, unspecified: Secondary | ICD-10-CM

## 2012-10-18 MED ORDER — TIOTROPIUM BROMIDE MONOHYDRATE 18 MCG IN CAPS: 18.0000 ug | ORAL_CAPSULE | Freq: Every day | RESPIRATORY_TRACT | Status: DC

## 2012-10-18 MED ORDER — PREDNISONE 20 MG PO TABS: ORAL_TABLET | ORAL | Status: DC

## 2012-10-18 NOTE — Progress Notes (Signed)
Subjective: 62 year old man who has been having increased problems with shortness of breath, especially over the last week or so. He can't do anything without a shortness of breath, breathing hard, chest tightness. He recently had the cardiac scan done which was negative. Is not really describing chest pain as much is just a tightness of breathing hard. He does smoke about 3 cigarettes a day. He's been using the albuterol inhaler very frequently. He also is on Symbicort. His wife says that he gets up in the night because of his difficulty breathing. Cough is not productive  Objective: Patient does not appear in any acute distress at this time. When he is sitting still he seems to be breathing comfortably. His throat was clear. Neck supple without significant nodes. Chest has poor air movement but no wheezes. Heart regular without murmurs gallops or arrhythmias. Peak flow meter testing was done, and he had a peak flow of 390 with a predicted value of about 490.  [facility] reading (PRIMARY) by  Dr. Alwyn Ren Increased bronchopulmonary markings but no infiltrate or mass is noted.  EKG completely normal appearance  Assessment: Shortness of breath/dyspnea History of long-term tobacco abuse Probable COPD  Plan: Continue the Symbicort. Add Spiriva Tapered dose prednisone Use the albuterol only every 4-6 hours as needed. Get rid of the cigarettes I am sure that the allergy season as part of his problem today. Refer for pulmonary evaluation

## 2012-10-18 NOTE — Patient Instructions (Signed)
Will contact you with regard to your appointment with the pulmonologist  Use the albuterol only every 4-6 hours  Take prednisone 3 tablets daily for 2 days, then 2 tablets daily for 2 days, then one tablet daily for 2 days, then one half tablet daily for 4 days.  Use the Spiriva daily as directed

## 2012-11-15 ENCOUNTER — Other Ambulatory Visit: Payer: Self-pay | Admitting: Family Medicine

## 2012-11-28 ENCOUNTER — Ambulatory Visit (INDEPENDENT_AMBULATORY_CARE_PROVIDER_SITE_OTHER): Payer: 59 | Admitting: Internal Medicine

## 2012-11-28 ENCOUNTER — Encounter: Payer: Self-pay | Admitting: Internal Medicine

## 2012-11-28 VITALS — BP 200/100 | HR 73 | Temp 98.5°F | Ht 63.5 in | Wt 157.6 lb

## 2012-11-28 DIAGNOSIS — Z720 Tobacco use: Secondary | ICD-10-CM

## 2012-11-28 DIAGNOSIS — R0600 Dyspnea, unspecified: Secondary | ICD-10-CM

## 2012-11-28 DIAGNOSIS — F17200 Nicotine dependence, unspecified, uncomplicated: Secondary | ICD-10-CM

## 2012-11-28 DIAGNOSIS — R0989 Other specified symptoms and signs involving the circulatory and respiratory systems: Secondary | ICD-10-CM

## 2012-11-28 DIAGNOSIS — R0609 Other forms of dyspnea: Secondary | ICD-10-CM

## 2012-11-28 NOTE — Progress Notes (Signed)
Subjective:    Patient ID: Travis Barajas, male    DOB: 06-30-1950, 62 y.o.   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 . Trying to get disability of musculoskeletal pain basis. He has a history of smoking  He has a 20 pack-year smoking history. Half pack a day for 40 years. He is now interested in quit smoking. At baseline he does not have shortness of breath but late April early May 2014 started developing insidious onset of shortness of breath that progressively got worse and was associated with cough, chest tightness and low sputum. He said the dyspnea was extremely bad and he could not take his usual breath. He also had orthopnea and paroxysmal nocturnal dyspnea this time. He saw primary care physician on 10/18/2012. Chest x-ray on this date was clear. Peak flow meter showed peak flow 390 against a predicted value of 490. Physical exam showed poor air movement of the chest without any wheezing. He had a normal EKG. On basis of this a diagnosis of obstructive lung disease most likely COPD exacerbation was made. We'll start her on tapered dose prednisone. According to the physician primary care notes he was already on Symbicort and Spiriva was added to this regimen but patient tells me that he was only started on Symbicort.  After the above interventions he significantly improved and almost his baseline at this point. Current COPD cat score is 12 and reflects low symptom burden. Pertinent data is as below  CXR 10/18/12 - clear Walk test 185 feet x 3 laps 11/28/2012 - did not desaturate Currently CAT score is 12 and is much better. Says would have beeen 40 prior to 10/18/12 Spirometry in office 11/28/2012 - NORMAL  -       CAT COPD Symptom & Quality of Life Score (GSK trademark) 0 is no burden. 5 is highest burden 11/28/2012   Never Cough -> Cough all the time 3  No phlegm in chest -> Chest is full of phlegm 1   No chest tightness -> Chest feels very tight 0  No dyspnea for 1 flight stairs/hill -> Very dyspneic for 1 flight of stairs 2  No limitations for ADL at home -> Very limited with ADL at home 1  Confident leaving home -> Not at all confident leaving home 1  Sleep soundly -> Do not sleep soundly because of lung condition 0  Lots of Energy -> No energy at all 4  TOTAL Score (max 40)  12       has a past medical history of Anxiety; Arthritis; Hypertension; and Allergy.   reports that he has been smoking Cigarettes.  He has a 20 pack-year smoking history. He has never used smokeless tobacco.   Estimated body mass index is 27.48 kg/(m^2) as calculated from the following:   Height as of this encounter: 5' 3.5" (1.613 m).   Weight as of this encounter: 157 lb 9.6 oz (71.487 kg).   Past Medical History  Diagnosis Date  . Anxiety   . Arthritis   . Hypertension   . Allergy      Family History  Problem Relation Age of Onset  . Diabetes Mother   . Hypertension Mother   . Alzheimer's disease Mother   . Diabetes Father   . Hypertension Father      History   Social History  . Marital Status: Married  Spouse Name: N/A    Number of Children: 7  . Years of Education: N/A   Occupational History  . Not on file.   Social History Main Topics  . Smoking status: Current Every Day Smoker -- 0.50 packs/day for 40 years    Types: Cigarettes  . Smokeless tobacco: Never Used  . Alcohol Use: Yes     Comment: occs  . Drug Use: No  . Sexually Active: Yes   Other Topics Concern  . Not on file   Social History Narrative  . No narrative on file     No Known Allergies   Outpatient Prescriptions Prior to Visit  Medication Sig Dispense Refill  . albuterol (PROVENTIL HFA;VENTOLIN HFA) 108 (90 BASE) MCG/ACT inhaler Inhale 2 puffs into the lungs every 6 (six) hours as needed.      . ALPRAZolam (XANAX) 0.5 MG tablet TAKE 1 TABLET BY MOUTH EVERY 6 HOURS AS NEEDED FOR STRESS  30 tablet   0  . budesonide-formoterol (SYMBICORT) 160-4.5 MCG/ACT inhaler Inhale 2 puffs into the lungs 2 (two) times daily.      . cholecalciferol (VITAMIN D) 1000 UNITS tablet Take 1,000 Units by mouth daily.      . cilostazol (PLETAL) 100 MG tablet Take 100 mg by mouth 2 (two) times daily.      . diclofenac (VOLTAREN) 75 MG EC tablet Take 1 tablet (75 mg total) by mouth 2 (two) times daily.  30 tablet  1  . doxazosin (CARDURA) 2 MG tablet Take 1/2 tablet at bedtime for 1 week then increase to 1 tablet at bedtime.  30 tablet  5  . fluticasone (FLONASE) 50 MCG/ACT nasal spray Place 2 sprays into the nose daily.      Marland Kitchen HYDROcodone-acetaminophen (NORCO/VICODIN) 5-325 MG per tablet Take 1 tablet by mouth every 8 (eight) hours as needed.  40 tablet  1  . levocetirizine (XYZAL) 5 MG tablet Take 1 tablet (5 mg total) by mouth daily.  30 tablet  5  . lisinopril-hydrochlorothiazide (PRINZIDE,ZESTORETIC) 20-25 MG per tablet Take 1 tablet by mouth daily.  90 tablet  3  . metoCLOPramide (REGLAN) 5 MG tablet Take 5 mg by mouth 4 (four) times daily.      . metoprolol (LOPRESSOR) 100 MG tablet Take 1 tablet (100 mg total) by mouth 2 (two) times daily.  60 tablet  6  . mometasone (NASONEX) 50 MCG/ACT nasal spray Place 2 sprays into the nose daily.      . naproxen (NAPROSYN) 500 MG tablet TAKE 1 TABLET BY MOUTH TWICE DAILY WITH MEALS  60 tablet  4  . pantoprazole (PROTONIX) 40 MG tablet Take 40 mg by mouth daily.      Marland Kitchen tiotropium (SPIRIVA HANDIHALER) 18 MCG inhalation capsule Place 1 capsule (18 mcg total) into inhaler and inhale daily.  30 capsule  3  . traMADol (ULTRAM) 50 MG tablet Take 1 tablet (50 mg total) by mouth every 8 (eight) hours as needed for pain.  40 tablet  1  . fluconazole (DIFLUCAN) 150 MG tablet Take 1 tablet  by mouth once a day.  14 tablet  1  . predniSONE (DELTASONE) 20 MG tablet Take 3 pills daily for 2 days, then 2 daily for 2 days, then one daily for 2 days, then one half daily for 4 days.  14  tablet  0  . promethazine (PHENERGAN) 25 MG tablet Take 25 mg by mouth every 6 (six) hours as needed.  No facility-administered medications prior to visit.      Review of Systems  Constitutional: Negative for fever and unexpected weight change.  HENT: Positive for sore throat and postnasal drip. Negative for ear pain, nosebleeds, congestion, rhinorrhea, sneezing, trouble swallowing, dental problem and sinus pressure.   Eyes: Negative for redness and itching.  Respiratory: Positive for cough, shortness of breath and wheezing. Negative for chest tightness.   Cardiovascular: Negative for palpitations and leg swelling.  Gastrointestinal: Negative for nausea and vomiting.  Genitourinary: Negative for dysuria.  Musculoskeletal: Negative for joint swelling.  Skin: Negative for rash.  Neurological: Negative for headaches.  Hematological: Does not bruise/bleed easily.  Psychiatric/Behavioral: Negative for dysphoric mood. The patient is not nervous/anxious.        Objective:   Physical Exam  Nursing note and vitals reviewed. Constitutional: He is oriented to person, place, and time. He appears well-developed and well-nourished. No distress.  HENT:  Head: Normocephalic and atraumatic.  Right Ear: External ear normal.  Left Ear: External ear normal.  Mouth/Throat: Oropharynx is clear and moist. No oropharyngeal exudate.  Eyes: Conjunctivae and EOM are normal. Pupils are equal, round, and reactive to light. Right eye exhibits no discharge. Left eye exhibits no discharge. No scleral icterus.  Neck: Normal range of motion. Neck supple. No JVD present. No tracheal deviation present. No thyromegaly present.  Cardiovascular: Normal rate, regular rhythm and intact distal pulses.  Exam reveals no gallop and no friction rub.   No murmur heard. Pulmonary/Chest: Effort normal and breath sounds normal. No respiratory distress. He has no wheezes. He has no rales. He exhibits no tenderness.   Abdominal: Soft. Bowel sounds are normal. He exhibits no distension and no mass. There is no tenderness. There is no rebound and no guarding.  Musculoskeletal: Normal range of motion. He exhibits no edema and no tenderness.  Lymphadenopathy:    He has no cervical adenopathy.  Neurological: He is alert and oriented to person, place, and time. He has normal reflexes. No cranial nerve deficit. Coordination normal.  Skin: Skin is warm and dry. No rash noted. He is not diaphoretic. No erythema. No pallor.  Psychiatric: He has a normal mood and affect. His behavior is normal. Judgment and thought content normal.          Assessment & Plan:

## 2012-11-28 NOTE — Patient Instructions (Addendum)
#  Shortness of breath  - possible and very likely you have copd or asthma; breathing test is normal and can reflect drug treatment so that is preventing me from capturing the diagnosis - continue symbicort as advised  #Disability application on musculoskeletal basis   - [facility]   #FOllowup 6 months with full PFT   

## 2012-11-29 ENCOUNTER — Telehealth: Payer: Self-pay

## 2012-11-29 NOTE — Assessment & Plan Note (Signed)
#  Shortness of breath  - possible and very likely you have copd or asthma; breathing test is normal and can reflect drug treatment so that is preventing me from capturing the diagnosis - continue symbicort as advised  #Disability application on musculoskeletal basis   - [facility]   #FOllowup 6 months with full PFT

## 2012-11-29 NOTE — Assessment & Plan Note (Signed)
Advised to quit but he is reluctant to do so

## 2012-11-29 NOTE — Telephone Encounter (Signed)
PATIENTS WIFE WAS  CALLING TO LET DR HOPPER KNOW ABOUT ISAAC'S BLOOD PRESSURE PLEASE CALL HER AT [phone]

## 2012-11-30 NOTE — Telephone Encounter (Signed)
Called wife, she states he went to the breathing doctor, his BP was 200/100. She is concerned that his BP doesn't go down enough. She is concerned that the two meds he is on for his BP is not working. She wants to know does he need to go into the hospital to try to get this down or does he need another medicine? She is afraid he is going to have a stroke. Please advise.

## 2012-12-03 NOTE — Telephone Encounter (Signed)
Have him come back in Monday or Wednesday for one more check

## 2012-12-05 NOTE — Telephone Encounter (Signed)
Called her to advise. Left message for her. She is advised to bring him in and to call me back.

## 2012-12-05 NOTE — Telephone Encounter (Signed)
Called wife to advise.  

## 2012-12-17 ENCOUNTER — Ambulatory Visit (INDEPENDENT_AMBULATORY_CARE_PROVIDER_SITE_OTHER): Payer: 59 | Admitting: Family Medicine

## 2012-12-17 VITALS — BP 206/96 | HR 65 | Temp 98.2°F | Resp 18 | Ht 66.0 in | Wt 163.0 lb

## 2012-12-17 DIAGNOSIS — M25549 Pain in joints of unspecified hand: Secondary | ICD-10-CM

## 2012-12-17 DIAGNOSIS — I10 Essential (primary) hypertension: Secondary | ICD-10-CM

## 2012-12-17 DIAGNOSIS — I739 Peripheral vascular disease, unspecified: Secondary | ICD-10-CM

## 2012-12-17 DIAGNOSIS — M79642 Pain in left hand: Secondary | ICD-10-CM

## 2012-12-17 DIAGNOSIS — J441 Chronic obstructive pulmonary disease with (acute) exacerbation: Secondary | ICD-10-CM

## 2012-12-17 DIAGNOSIS — M2550 Pain in unspecified joint: Secondary | ICD-10-CM

## 2012-12-17 MED ORDER — METOPROLOL TARTRATE 100 MG PO TABS: 100.0000 mg | ORAL_TABLET | Freq: Two times a day (BID) | ORAL | Status: DC

## 2012-12-17 MED ORDER — LISINOPRIL-HYDROCHLOROTHIAZIDE 20-25 MG PO TABS: 1.0000 | ORAL_TABLET | Freq: Two times a day (BID) | ORAL | Status: DC

## 2012-12-17 MED ORDER — HYDROCODONE-ACETAMINOPHEN 5-325 MG PO TABS: 1.0000 | ORAL_TABLET | Freq: Three times a day (TID) | ORAL | Status: DC | PRN

## 2012-12-17 MED ORDER — TIOTROPIUM BROMIDE MONOHYDRATE 18 MCG IN CAPS: 18.0000 ug | ORAL_CAPSULE | Freq: Every day | RESPIRATORY_TRACT | Status: DC

## 2012-12-17 MED ORDER — DOXAZOSIN MESYLATE 4 MG PO TABS: ORAL_TABLET | ORAL | Status: DC

## 2012-12-17 MED ORDER — CILOSTAZOL 100 MG PO TABS: 100.0000 mg | ORAL_TABLET | Freq: Two times a day (BID) | ORAL | Status: DC

## 2012-12-17 NOTE — Patient Instructions (Addendum)
Drink plenty of fluids  Work hard on eating away from the cigarettes.  Congratulations on decreasing the caffeine intake  Increase the lisinopril/hydrochlorothiazide to twice daily breakfast and supper  Increase the Cardura (doxazosin) to 4 mg daily  Stop the diclofenac is admitted with raise her blood pressure  Take acetaminophen (Tylenol) 500 mg 2 tablets 3 times daily. When you take a hydrocodone/APAP you should take one less acetaminophen tablet. You do not want to cause liver damage.  Return in September for recheck, sooner if your blood pressures are continuing to run greater than 160/95

## 2012-12-17 NOTE — Progress Notes (Signed)
Subjective

## 2012-12-18 LAB — COMPREHENSIVE METABOLIC PANEL
Albumin: 3.9 g/dL (ref 3.5–5.2)
Alkaline Phosphatase: 76 U/L (ref 39–117)
BUN: 12 mg/dL (ref 6–23)
CO2: 30 mEq/L (ref 19–32)
Calcium: 9.2 mg/dL (ref 8.4–10.5)
Chloride: 105 mEq/L (ref 96–112)
Glucose, Bld: 75 mg/dL (ref 70–99)
Potassium: 4.1 mEq/L (ref 3.5–5.3)
Sodium: 139 mEq/L (ref 135–145)
Total Protein: 6.9 g/dL (ref 6.0–8.3)

## 2012-12-19 ENCOUNTER — Encounter: Payer: Self-pay | Admitting: Family Medicine

## 2013-01-24 ENCOUNTER — Telehealth (HOSPITAL_COMMUNITY): Payer: Self-pay | Admitting: Family Medicine

## 2013-01-24 NOTE — Telephone Encounter (Signed)
Sent letter to patient regarding scheduling a test that was ordered by Dr. Alwyn Ren

## 2013-02-06 ENCOUNTER — Telehealth (HOSPITAL_COMMUNITY): Payer: Self-pay | Admitting: Family Medicine

## 2013-02-06 NOTE — Telephone Encounter (Signed)
Sent letter to ordering physician regarding testing not being scheduled due to patient not responding to phone calls or letters. 

## 2013-02-19 ENCOUNTER — Encounter: Payer: Self-pay | Admitting: Family Medicine

## 2013-04-05 ENCOUNTER — Telehealth: Payer: Self-pay

## 2013-04-05 DIAGNOSIS — M2550 Pain in unspecified joint: Secondary | ICD-10-CM

## 2013-04-05 DIAGNOSIS — M1990 Unspecified osteoarthritis, unspecified site: Secondary | ICD-10-CM

## 2013-04-05 NOTE — Telephone Encounter (Signed)
Pharm requests RF of tramadol 50 mg. It looks like pt was supposed to f/up in Sept. Do you want to give 1 mos RF w/note to RTC for more (I have pended this), or deny?

## 2013-04-07 MED ORDER — TRAMADOL HCL 50 MG PO TABS: 50.0000 mg | ORAL_TABLET | Freq: Three times a day (TID) | ORAL | Status: DC | PRN

## 2013-04-07 NOTE — Telephone Encounter (Signed)
faxed

## 2013-04-07 NOTE — Telephone Encounter (Signed)
Rx printed and signed at TL desk

## 2013-05-05 ENCOUNTER — Ambulatory Visit (INDEPENDENT_AMBULATORY_CARE_PROVIDER_SITE_OTHER): Payer: 59 | Admitting: Emergency Medicine

## 2013-05-05 VITALS — BP 200/102 | HR 74 | Temp 98.2°F | Resp 18 | Ht 65.25 in | Wt 162.4 lb

## 2013-05-05 DIAGNOSIS — Z9114 Patient's other noncompliance with medication regimen: Secondary | ICD-10-CM

## 2013-05-05 DIAGNOSIS — J441 Chronic obstructive pulmonary disease with (acute) exacerbation: Secondary | ICD-10-CM

## 2013-05-05 DIAGNOSIS — Z9119 Patient's noncompliance with other medical treatment and regimen: Secondary | ICD-10-CM

## 2013-05-05 DIAGNOSIS — J209 Acute bronchitis, unspecified: Secondary | ICD-10-CM

## 2013-05-05 DIAGNOSIS — I10 Essential (primary) hypertension: Secondary | ICD-10-CM

## 2013-05-05 DIAGNOSIS — Z91148 Patient's other noncompliance with medication regimen for other reason: Secondary | ICD-10-CM

## 2013-05-05 LAB — COMPREHENSIVE METABOLIC PANEL
ALT: 13 U/L (ref 0–53)
AST: 17 U/L (ref 0–37)
Albumin: 4.1 g/dL (ref 3.5–5.2)
Alkaline Phosphatase: 102 U/L (ref 39–117)
BUN: 12 mg/dL (ref 6–23)
Calcium: 9.4 mg/dL (ref 8.4–10.5)
Potassium: 3.4 mEq/L — ABNORMAL LOW (ref 3.5–5.3)
Sodium: 139 mEq/L (ref 135–145)
Total Bilirubin: 0.2 mg/dL — ABNORMAL LOW (ref 0.3–1.2)
Total Protein: 7.4 g/dL (ref 6.0–8.3)

## 2013-05-05 MED ORDER — AMLODIPINE BESYLATE 5 MG PO TABS: 5.0000 mg | ORAL_TABLET | Freq: Every day | ORAL | Status: DC

## 2013-05-05 MED ORDER — ALPRAZOLAM 0.5 MG PO TABS: 0.5000 mg | ORAL_TABLET | Freq: Three times a day (TID) | ORAL | Status: DC | PRN

## 2013-05-05 MED ORDER — AZITHROMYCIN 250 MG PO TABS: ORAL_TABLET | ORAL | Status: DC

## 2013-05-05 MED ORDER — HYDROCOD POLST-CHLORPHEN POLST 10-8 MG/5ML PO LQCR: 5.0000 mL | Freq: Two times a day (BID) | ORAL | Status: DC | PRN

## 2013-05-05 NOTE — Addendum Note (Signed)
Addended by: Carmelina Dane on: 05/05/2013 09:55 AM   Modules accepted: Orders

## 2013-05-05 NOTE — Patient Instructions (Signed)

## 2013-05-05 NOTE — Progress Notes (Signed)
[facility] [address], [place] [address] [phone]  Date:  05/05/2013   Name:  Travis Barajas   DOB:  [birth year]   MRN:  [id number]  PCP:  Janace Hoard, MD    Chief Complaint: Hoarse and chest congestion   History of Present Illness:  Travis Barajas is a 62 y.o. very pleasant male patient who presents with the following:  Ill this week with progressive symptoms of nasal congestion and mucoid scant drainage.  Has post nasal drainage and a sore throat.  No fever or chills.  Cough productive of mucopurulent sputum.  No wheezing or shortness of breath.  No nausea or vomiting.  Has decreased cigs from 1/2 PPD to 3 cigs daily.  Claims to be taking his meds regularly but sometimes forgets.  Taking OTC decongestants and says he is taking his antihypertensives as ordered.  No improvement with over the counter medications or other home remedies. Denies other complaint or health concern today.   Patient Active Problem List   Diagnosis Date Noted  . Dyspnea 10/18/2012  . COPD exacerbation 10/18/2012  . Tobacco user 12/05/2011  . HTN (hypertension), malignant 10/01/2011  . GERD (gastroesophageal reflux disease) 07/23/2011  . Allergic rhinitis 07/23/2011  . Joint pain 07/23/2011    Past Medical History  Diagnosis Date  . Anxiety   . Arthritis   . Hypertension   . Allergy     History reviewed. No pertinent past surgical history.  History  Substance Use Topics  . Smoking status: Current Every Day Smoker -- 0.50 packs/day for 40 years    Types: Cigarettes  . Smokeless tobacco: Never Used  . Alcohol Use: Yes     Comment: occs    Family History  Problem Relation Age of Onset  . Diabetes Mother   . Hypertension Mother   . Alzheimer's disease Mother   . Diabetes Father   . Hypertension Father     No Known Allergies  Medication list has been reviewed and updated.  Current Outpatient Prescriptions on File Prior to Visit  Medication Sig  Dispense Refill  . albuterol (PROVENTIL HFA;VENTOLIN HFA) 108 (90 BASE) MCG/ACT inhaler Inhale 2 puffs into the lungs every 6 (six) hours as needed.      . ALPRAZolam (XANAX) 0.5 MG tablet TAKE 1 TABLET BY MOUTH EVERY 6 HOURS AS NEEDED FOR STRESS  30 tablet  0  . budesonide-formoterol (SYMBICORT) 160-4.5 MCG/ACT inhaler Inhale 2 puffs into the lungs 2 (two) times daily.      . cholecalciferol (VITAMIN D) 1000 UNITS tablet Take 1,000 Units by mouth daily.      . cilostazol (PLETAL) 100 MG tablet Take 1 tablet (100 mg total) by mouth 2 (two) times daily.  180 tablet  3  . fluticasone (FLONASE) 50 MCG/ACT nasal spray Place 2 sprays into the nose daily.      Marland Kitchen HYDROcodone-acetaminophen (NORCO/VICODIN) 5-325 MG per tablet Take 1 tablet by mouth every 8 (eight) hours as needed.  40 tablet  1  . lisinopril-hydrochlorothiazide (PRINZIDE,ZESTORETIC) 20-25 MG per tablet Take 1 tablet by mouth 2 (two) times daily.  180 tablet  3  . metoprolol (LOPRESSOR) 100 MG tablet Take 1 tablet (100 mg total) by mouth 2 (two) times daily.  180 tablet  3  . mometasone (NASONEX) 50 MCG/ACT nasal spray Place 2 sprays into the nose daily.      . naproxen (NAPROSYN) 500 MG tablet TAKE 1 TABLET BY MOUTH TWICE DAILY WITH  MEALS  60 tablet  4  . diclofenac (VOLTAREN) 75 MG EC tablet Take 1 tablet (75 mg total) by mouth 2 (two) times daily.  30 tablet  1  . doxazosin (CARDURA) 4 MG tablet Take one at bedtime for blood pressure  90 tablet  3  . levocetirizine (XYZAL) 5 MG tablet Take 1 tablet (5 mg total) by mouth daily.  30 tablet  5  . pantoprazole (PROTONIX) 40 MG tablet Take 40 mg by mouth daily.      Marland Kitchen tiotropium (SPIRIVA HANDIHALER) 18 MCG inhalation capsule Place 1 capsule (18 mcg total) into inhaler and inhale daily.  90 capsule  3  . traMADol (ULTRAM) 50 MG tablet Take 1 tablet (50 mg total) by mouth every 8 (eight) hours as needed for pain. PATIENT NEEDS OFFICE VISIT FOR ADDITIONAL REFILLS  40 tablet  0   No current  facility-administered medications on file prior to visit.    Review of Systems:  As per HPI, otherwise negative.    Physical Examination: Filed Vitals:   05/05/13 0850  BP: 200/102  Pulse: 74  Temp: 98.2 F (36.8 C)  Resp: 18   Filed Vitals:   05/05/13 0850  Height: 5' 5.25" (1.657 m)  Weight: 162 lb 6.4 oz (73.664 kg)   Body mass index is 26.83 kg/(m^2). Ideal Body Weight: Weight in (lb) to have BMI = 25: 151.1  GEN: WDWN, NAD, Non-toxic, A & O x 3 HEENT: Atraumatic, Normocephalic. Neck supple. No masses, No LAD. Ears and Nose: No external deformity. CV: RRR, No M/G/R. No JVD. No thrill. No extra heart sounds. PULM: CTA B, no wheezes, crackles, rhonchi. No retractions. No resp. distress. No accessory muscle use. ABD: S, NT, ND, +BS. No rebound. No HSM. EXTR: No c/c/e NEURO Normal gait.  PSYCH: Normally interactive. Conversant. Not depressed or anxious appearing.  Calm demeanor.    Assessment and Plan: Hypertension uncontrolled Non compliant Bronchitis  Signed,  Phillips Odor, MD

## 2013-05-08 ENCOUNTER — Telehealth (HOSPITAL_COMMUNITY): Payer: Self-pay | Admitting: *Deleted

## 2013-05-29 ENCOUNTER — Telehealth (HOSPITAL_COMMUNITY): Payer: Self-pay | Admitting: *Deleted

## 2013-06-05 ENCOUNTER — Other Ambulatory Visit: Payer: Self-pay | Admitting: Family Medicine

## 2013-06-07 ENCOUNTER — Other Ambulatory Visit: Payer: Self-pay | Admitting: *Deleted

## 2013-06-07 MED ORDER — LEVOCETIRIZINE DIHYDROCHLORIDE 5 MG PO TABS: ORAL_TABLET | ORAL | Status: DC

## 2013-06-20 ENCOUNTER — Telehealth: Payer: Self-pay | Admitting: Internal Medicine

## 2013-06-20 NOTE — Telephone Encounter (Signed)
°  Called patient to schedule a follow up apt from the recall list. Left messages x3. Sent letter 06/20/12 °

## 2013-10-30 ENCOUNTER — Other Ambulatory Visit: Payer: Self-pay | Admitting: Emergency Medicine

## 2013-10-30 NOTE — Telephone Encounter (Signed)
Called in alprazolam

## 2013-10-30 NOTE — Telephone Encounter (Signed)
Pt is due for f/up this month. I put note on Rx OV needed for more if you want to refill it. Please review.

## 2013-11-01 ENCOUNTER — Ambulatory Visit (INDEPENDENT_AMBULATORY_CARE_PROVIDER_SITE_OTHER): Payer: 59 | Admitting: Family Medicine

## 2013-11-01 ENCOUNTER — Encounter: Payer: Self-pay | Admitting: Family Medicine

## 2013-11-01 VITALS — BP 197/101 | HR 98 | Ht 65.0 in | Wt 164.0 lb

## 2013-11-01 DIAGNOSIS — Z13220 Encounter for screening for lipoid disorders: Secondary | ICD-10-CM

## 2013-11-01 DIAGNOSIS — N400 Enlarged prostate without lower urinary tract symptoms: Secondary | ICD-10-CM

## 2013-11-01 DIAGNOSIS — I10 Essential (primary) hypertension: Secondary | ICD-10-CM

## 2013-11-01 MED ORDER — OLMESARTAN MEDOXOMIL-HCTZ 40-25 MG PO TABS: 1.0000 | ORAL_TABLET | Freq: Every day | ORAL | Status: DC

## 2013-11-01 MED ORDER — TAMSULOSIN HCL 0.4 MG PO CAPS: 0.4000 mg | ORAL_CAPSULE | Freq: Every day | ORAL | Status: DC

## 2013-11-01 NOTE — Progress Notes (Signed)
CC: Travis Barajas is a 63 y.o. male is here for Establish Care, Hypertension and Medication Management   Subjective: HPI:  Pleasant 63 year old here to establish care  Reports a history of hypertension currently on doxazosin, metoprolol, amlodipine, lisinopril, hydrochlorothiazide. No outside blood pressures to report however on chart review has a history of extremely poorly controlled blood pressure.  He tells me within the last 5 years he has had normal carotid ultrasound, normal cardiac stress test. He reports he focuses on trying to limit his sodium intake. Occasionally drinks alcohol, occasionally smokes tobacco. He denies any recent chest pain, shortness of breath, motor sensory disturbances. Denies irregular heartbeat nor limb claudication.  Nothing particularly makes blood pressure better or worsen his eyes.  Reports a history of BPH he currently has established with a urologist in [facility]. He describes a procedure that sounds like aTURP that was performed likely 3 years ago which significantly improved lower urinary tract symptoms and erectile dysfunction. He states that symptoms have slowly returned over the last 3 years now moderate in severity. Defined symptoms as dribbling after urinating, urinary urgency, awakening one to 2 times a night to urinate, urinary hesitancy. Denies any abnormal penile discharge nor dysuria.  He tells me he's used medications like Viagra and Cialis before however it caused intolerable abdominal pain and hallucinations.  He is uncertain whether he has ever had a lipid panel in the last year.   Review of Systems - General ROS: negative for - chills, fever, night sweats, weight gain or weight loss Ophthalmic ROS: negative for - decreased vision Psychological ROS: negative for - anxiety or depression ENT ROS: negative for - hearing change, nasal congestion, tinnitus or allergies Hematological and Lymphatic ROS: negative for - bleeding problems, bruising  or swollen lymph nodes Breast ROS: negative Respiratory ROS: no cough, shortness of breath, or wheezing Cardiovascular ROS: no chest pain or dyspnea on exertion Gastrointestinal ROS: no abdominal pain, change in bowel habits, or black or bloody stools Genito-Urinary ROS: negative for - genital discharge, genital ulcers, incontinence or abnormal bleeding from genitals Musculoskeletal ROS: negative for - joint pain or muscle pain Neurological ROS: negative for - headaches or memory loss Dermatological ROS: negative for lumps, mole changes, rash and skin lesion changes  Past Medical History  Diagnosis Date  . Anxiety   . Arthritis   . Hypertension   . Allergy     No past surgical history on file. Family History  Problem Relation Age of Onset  . Diabetes Mother   . Hypertension Mother   . Alzheimer's disease Mother   . Diabetes Father   . Hypertension Father     History   Social History  . Marital Status: Married    Spouse Name: N/A    Number of Children: 7  . Years of Education: N/A   Occupational History  . Not on file.   Social History Main Topics  . Smoking status: Current Every Day Smoker -- 0.50 packs/day for 40 years    Types: Cigarettes  . Smokeless tobacco: Never Used  . Alcohol Use: Yes     Comment: occs  . Drug Use: No  . Sexual Activity: Yes   Other Topics Concern  . Not on file   Social History Narrative  . No narrative on file     Objective: BP 197/101  Pulse 98  Ht 5\' 5"  (1.651 m)  Wt 164 lb (74.39 kg)  BMI 27.29 kg/m2  General: Alert and Oriented, No  Acute Distress HEENT: Pupils equal, round, reactive to light. Conjunctivae clear.  Moist membranes pharynx unremarkable Lungs: Clear to auscultation bilaterally, no wheezing/ronchi/rales.  Comfortable work of breathing. Good air movement. Cardiac: Regular rate and rhythm. Normal S1/S2.  No murmurs, rubs, nor gallops.  No carotid bruits Abdomen: Soft nontender. Extremities: No peripheral edema.   Strong peripheral pulses.  Mental Status: No depression, anxiety, nor agitation. Skin: Warm and dry.  Assessment & Plan: Travis Barajas was seen today for establish care, hypertension and medication management.  Diagnoses and associated orders for this visit:  HTN (hypertension), malignant - BASIC METABOLIC PANEL WITH GFR  Screening, lipid - Lipid panel  BPH (benign prostatic hyperplasia) - tamsulosin (FLOMAX) 0.4 MG CAPS capsule; Take 1 capsule (0.4 mg total) by mouth daily. For prostate.  Other Orders - olmesartan-hydrochlorothiazide (BENICAR HCT) 40-25 MG per tablet; Take 1 tablet by mouth daily.    Essential hypertension: Chronic uncontrolled condition checking renal function. I like him to stop lisinopril-hydrochlorothiazide instead start Benicar HCT at maximum dosage.  We'll consider sleep study if blood pressure remains uncontrolled. BPH: Uncontrolled, I'm requesting records from his urologist have asked him to followup with his urologist but start Flomax instead of Cardura pending his followup visit He is overdue for dyslipidemia screening   Return in about 2 weeks (around 11/15/2013).

## 2013-11-07 ENCOUNTER — Encounter: Payer: Self-pay | Admitting: Family Medicine

## 2013-11-07 DIAGNOSIS — R319 Hematuria, unspecified: Secondary | ICD-10-CM | POA: Insufficient documentation

## 2013-11-08 ENCOUNTER — Encounter: Payer: Self-pay | Admitting: *Deleted

## 2013-11-17 ENCOUNTER — Telehealth: Payer: Self-pay | Admitting: Family Medicine

## 2013-11-17 DIAGNOSIS — E785 Hyperlipidemia, unspecified: Secondary | ICD-10-CM

## 2013-11-17 LAB — BASIC METABOLIC PANEL WITH GFR
BUN: 18 mg/dL (ref 6–23)
CO2: 31 meq/L (ref 19–32)
Calcium: 9.2 mg/dL (ref 8.4–10.5)
Chloride: 102 mEq/L (ref 96–112)
Creat: 0.87 mg/dL (ref 0.50–1.35)
GFR, Est African American: >89 mL/min
GFR, Est Non African American: >89 mL/min
Glucose, Bld: 74 mg/dL (ref 70–99)
POTASSIUM: 3.9 meq/L (ref 3.5–5.3)
SODIUM: 142 meq/L (ref 135–145)

## 2013-11-17 LAB — LIPID PANEL
Cholesterol: 231 mg/dL — ABNORMAL HIGH (ref 0–200)
HDL: 33 mg/dL — ABNORMAL LOW (ref >39)
LDL CALC: 151 mg/dL — AB (ref 0–99)
Total CHOL/HDL Ratio: 7 Ratio
Triglycerides: 234 mg/dL — ABNORMAL HIGH (ref <150)
VLDL: 47 mg/dL — AB (ref 0–40)

## 2013-11-17 MED ORDER — ROSUVASTATIN CALCIUM 20 MG PO TABS: 20.0000 mg | ORAL_TABLET | Freq: Every day | ORAL | Status: DC

## 2013-11-17 NOTE — Telephone Encounter (Signed)
Pt.notified

## 2013-11-17 NOTE — Telephone Encounter (Signed)
Travis Barajas, Will you please let patient know that his kidney function and fasting blood sugar were normal.  His cholesterol is significant elevated and I'd encourage him to start crestor AKA rosuvastatin which I've sent to his pharmacy. We'll want to recheck this in about three months.

## 2013-11-21 ENCOUNTER — Ambulatory Visit (INDEPENDENT_AMBULATORY_CARE_PROVIDER_SITE_OTHER): Payer: 59 | Admitting: Family Medicine

## 2013-11-21 ENCOUNTER — Encounter: Payer: Self-pay | Admitting: Family Medicine

## 2013-11-21 VITALS — BP 164/83 | HR 80 | Wt 170.0 lb

## 2013-11-21 DIAGNOSIS — G4719 Other hypersomnia: Secondary | ICD-10-CM

## 2013-11-21 DIAGNOSIS — N529 Male erectile dysfunction, unspecified: Secondary | ICD-10-CM

## 2013-11-21 DIAGNOSIS — I10 Essential (primary) hypertension: Secondary | ICD-10-CM

## 2013-11-21 DIAGNOSIS — N400 Enlarged prostate without lower urinary tract symptoms: Secondary | ICD-10-CM | POA: Insufficient documentation

## 2013-11-21 DIAGNOSIS — G4710 Hypersomnia, unspecified: Secondary | ICD-10-CM

## 2013-11-21 DIAGNOSIS — E785 Hyperlipidemia, unspecified: Secondary | ICD-10-CM

## 2013-11-21 MED ORDER — OLMESARTAN MEDOXOMIL-HCTZ 40-25 MG PO TABS: 1.0000 | ORAL_TABLET | Freq: Every day | ORAL | Status: DC

## 2013-11-21 NOTE — Progress Notes (Signed)
CC: Travis Barajas is a 63 y.o. male is here for Hypertension   Subjective: HPI:  Followup hyperlipidemia: Since starting Crestor he denies right upper quadrant pain nor myalgias.  Followup hypertension: He states that over the past week he has seen blood pressures below 140/90 since starting Benicar 4 weeks ago. He denies hypotensive episodes. He apologizes for missing his blood pressure medication dosing today and yesterday.  Denies chest pain, shortness of breath, orthopnea, peripheral edema.  Patient complains of difficulty initiating erection that has been present for the past year. He has tried Viagra and Cialis however had intolerable side effects. Nothing particularly makes the symptoms better or worse. There are moderate in severity and occur on a daily basis. He states he has libido but cannot act on it. He denies any other genitourinary complaints.  Complains of daytime sleepiness that is described as severe in severity and present for matter of months. Present on a daily basis. Accompanied by snoring, witnessed apneic episodes, the need for napping during the day. Nothing particularly makes symptoms better or worse and this has not been evaluated yet. Also accompanied by short-term memory loss  Review Of Systems Outlined In HPI  Past Medical History  Diagnosis Date  . Anxiety   . Arthritis   . Hypertension   . Allergy     No past surgical history on file. Family History  Problem Relation Age of Onset  . Diabetes Mother   . Hypertension Mother   . Alzheimer's disease Mother   . Diabetes Father   . Hypertension Father     History   Social History  . Marital Status: Married    Spouse Name: N/A    Number of Children: 7  . Years of Education: N/A   Occupational History  . Not on file.   Social History Main Topics  . Smoking status: Current Every Day Smoker -- 0.50 packs/day for 40 years    Types: Cigarettes  . Smokeless tobacco: Never Used  . Alcohol Use:  Yes     Comment: occs  . Drug Use: No  . Sexual Activity: Yes   Other Topics Concern  . Not on file   Social History Narrative  . No narrative on file     Objective: BP 164/83  Pulse 80  Wt 170 lb (77.111 kg)  General: Alert and Oriented, No Acute Distress HEENT: Pupils equal, round, reactive to light. Conjunctivae clear.  Moist membranes pharynx unremarkable Lungs: Clear to auscultation bilaterally, no wheezing/ronchi/rales.  Comfortable work of breathing. Good air movement. Cardiac: Regular rate and rhythm. Normal S1/S2.  No murmurs, rubs, nor gallops.   Extremities: No peripheral edema.  Strong peripheral pulses.  Mental Status: No depression, anxiety, nor agitation. Skin: Warm and dry.  Assessment & Plan: Travis Barajas was seen today for hypertension.  Diagnoses and associated orders for this visit:  Hyperlipidemia  HTN (hypertension), malignant - olmesartan-hydrochlorothiazide (BENICAR HCT) 40-25 MG per tablet; Take 1 tablet by mouth daily.  Excessive daytime sleepiness - Split night study; Future  Erectile dysfunction - Testosterone, free, total    Hyperlipidemia: Controlled he has started Crestor we will recheck cholesterol in 3 months Hypertension: Improving, I've asked him to provide me with a list of blood pressures over the next week once he gets back on his medications, continue new Benicar HCT Excessive daytime sleepiness: Ordering a sleep study I encouraged him to take his alprazolam with him when he goes to have this performed Erectile dysfunction: Checking testosterone  level    Return in about 3 months (around 02/21/2014) for BP.

## 2013-11-27 LAB — TESTOSTERONE, FREE, TOTAL, SHBG
SEX HORMONE BINDING: 63 nmol/L (ref 13–71)
TESTOSTERONE FREE: 77.4 pg/mL (ref 47.0–244.0)
Testosterone-% Free: 1.4 % — ABNORMAL LOW (ref 1.6–2.9)
Testosterone: 566 ng/dL (ref 300–890)

## 2013-12-15 ENCOUNTER — Other Ambulatory Visit: Payer: Self-pay | Admitting: Physician Assistant

## 2013-12-18 ENCOUNTER — Telehealth: Payer: Self-pay

## 2013-12-18 ENCOUNTER — Other Ambulatory Visit: Payer: Self-pay

## 2013-12-18 DIAGNOSIS — I10 Essential (primary) hypertension: Secondary | ICD-10-CM

## 2013-12-18 MED ORDER — ROSUVASTATIN CALCIUM 20 MG PO TABS: 20.0000 mg | ORAL_TABLET | Freq: Every day | ORAL | Status: DC

## 2013-12-18 MED ORDER — ALPRAZOLAM 0.5 MG PO TABS: 0.5000 mg | ORAL_TABLET | Freq: Three times a day (TID) | ORAL | Status: DC | PRN

## 2013-12-18 MED ORDER — NAPROXEN 500 MG PO TABS: 500.0000 mg | ORAL_TABLET | Freq: Two times a day (BID) | ORAL | Status: DC

## 2013-12-18 MED ORDER — OLMESARTAN MEDOXOMIL-HCTZ 40-25 MG PO TABS: 1.0000 | ORAL_TABLET | Freq: Every day | ORAL | Status: DC

## 2013-12-18 NOTE — Telephone Encounter (Signed)
Patient called left message and stated that he needed refills on medication but didn't say which meds. I returned call but no answer./ Josiah Lobo, CMA

## 2013-12-19 ENCOUNTER — Ambulatory Visit (HOSPITAL_BASED_OUTPATIENT_CLINIC_OR_DEPARTMENT_OTHER): Payer: 59 | Attending: Family Medicine

## 2013-12-19 VITALS — Ht 65.0 in | Wt 168.0 lb

## 2013-12-19 DIAGNOSIS — G4719 Other hypersomnia: Secondary | ICD-10-CM

## 2013-12-19 DIAGNOSIS — G4733 Obstructive sleep apnea (adult) (pediatric): Secondary | ICD-10-CM | POA: Insufficient documentation

## 2013-12-23 DIAGNOSIS — G4733 Obstructive sleep apnea (adult) (pediatric): Secondary | ICD-10-CM

## 2013-12-23 DIAGNOSIS — G4710 Hypersomnia, unspecified: Secondary | ICD-10-CM

## 2013-12-23 NOTE — Sleep Study (Signed)
   NAME: Travis Barajas DATE OF BIRTH:  1951-04-25 MEDICAL RECORD NUMBER [id number]  LOCATION: [facility]  PHYSICIAN: Levii Hairfield D  DATE OF STUDY: 12/19/2013  SLEEP STUDY TYPE: Nocturnal Polysomnogram               REFERRING PHYSICIAN: Marcial Pacas, DO  INDICATION FOR STUDY: Hypersomnia with sleep apnea  EPWORTH SLEEPINESS SCORE:   3/24 HEIGHT: 5\' 5"  (165.1 cm)  WEIGHT: 168 lb (76.204 kg)    Body mass index is 27.96 kg/(m^2).  NECK SIZE: 15.5 in.  MEDICATIONS: Charted for review  SLEEP ARCHITECTURE: Total sleep time 250.5 minutes with sleep efficiency 64.7%. Stage I was 4.4%, stage II 77.6%, stage III absent, REM 18% of total sleep time. Sleep latency 10 minutes, REM latency 87.5 minutes, awake after sleep onset 86.5 minutes, arousal index 4.3, bedtime medication: None  RESPIRATORY DATA: Apnea hypopneas index (AHI) 8.6 per hour. 36 total events scored including 7 obstructive apneas and 29 hypopneas. All events were while sleeping supine. There were not enough events to permit application of split protocol CPAP titration.  OXYGEN DATA: Moderate snoring with oxygen desaturation to a nadir of 74% and a mean saturation through the study of 93.3% on room air.  CARDIAC DATA: Sinus rhythm with PACs and PVCs  MOVEMENT/PARASOMNIA: No significant movement disturbance, bathroom x1  IMPRESSION/ RECOMMENDATION:   1) Mild obstructive sleep apnea/hypopneas syndrome, AHI 8.6 per hour with supine events. Random AHI 41.3 per hour. Moderate snoring with oxygen desaturation to a nadir of 74% and a mean saturation through the study of 93.3% on room air. 2) There were not enough early events on the study to permit application of split protocol CPAP titration. Scores in this range may respond to conservative measures including weight loss or management of soft tissue nasopharyngeal obstruction if appropriate. On an individual basis, CPAP or an oral appliance might be  considered.   Signed Baird Lyons M.D. Deneise Lever Diplomate, American Board of Sleep Medicine  ELECTRONICALLY SIGNED ON:  12/23/2013, 10:33 AM [place] PH: [phone]   FX: [phone] [place]

## 2014-01-01 ENCOUNTER — Telehealth: Payer: Self-pay | Admitting: Family Medicine

## 2014-01-01 NOTE — Telephone Encounter (Signed)
Travis Barajas please let patient know that his sleep study confirmed sleep apnea however only to a very mild degree.  At his convenience I encouraged him to followup with me to discuss further management and to see how aggressive he wants to be about treating this.

## 2014-01-02 NOTE — Telephone Encounter (Signed)
Pt.notified

## 2014-01-03 ENCOUNTER — Other Ambulatory Visit: Payer: Self-pay | Admitting: Family Medicine

## 2014-01-06 ENCOUNTER — Other Ambulatory Visit: Payer: Self-pay | Admitting: Family Medicine

## 2014-02-08 ENCOUNTER — Other Ambulatory Visit: Payer: Self-pay | Admitting: Family Medicine

## 2014-02-21 ENCOUNTER — Ambulatory Visit (INDEPENDENT_AMBULATORY_CARE_PROVIDER_SITE_OTHER): Payer: 59 | Admitting: Family Medicine

## 2014-02-21 ENCOUNTER — Encounter: Payer: Self-pay | Admitting: Family Medicine

## 2014-02-21 VITALS — BP 226/88 | HR 61 | Wt 175.0 lb

## 2014-02-21 DIAGNOSIS — I10 Essential (primary) hypertension: Secondary | ICD-10-CM

## 2014-02-21 DIAGNOSIS — N400 Enlarged prostate without lower urinary tract symptoms: Secondary | ICD-10-CM

## 2014-02-21 DIAGNOSIS — E785 Hyperlipidemia, unspecified: Secondary | ICD-10-CM

## 2014-02-21 MED ORDER — AMLODIPINE BESYLATE 5 MG PO TABS: 5.0000 mg | ORAL_TABLET | Freq: Every day | ORAL | Status: DC

## 2014-02-21 MED ORDER — METOPROLOL TARTRATE 100 MG PO TABS: 100.0000 mg | ORAL_TABLET | Freq: Two times a day (BID) | ORAL | Status: DC

## 2014-02-21 MED ORDER — OLMESARTAN MEDOXOMIL-HCTZ 40-25 MG PO TABS: 1.0000 | ORAL_TABLET | Freq: Every day | ORAL | Status: DC

## 2014-02-21 MED ORDER — DOXAZOSIN MESYLATE 2 MG PO TABS: 2.0000 mg | ORAL_TABLET | Freq: Every day | ORAL | Status: DC

## 2014-02-21 NOTE — Progress Notes (Signed)
CC: Travis Barajas is a 63 y.o. male is here for Hypertension   Subjective: HPI:  Followup hypertension: No outside blood pressures to report since I saw him last. He has run out of Benicar HCT and did not get this refilled due to forgetting about needing to take on a daily basis.  He claims he continues to take Norvasc however it appears refills ran out many months ago, additionally from Toprol he tells me he is still taking this however refills would have run out a few weeks ago. Denies chest pain shortness of breath orthopnea nor peripheral edema. Denies motor or sensory disturbances other than being able to hear his heart rate as a whooshing sound when he is quiet and trying to fall asleep. Symptoms have been present for about a month now without any headache.  Followup hyperlipidemia: Is been 3 months now since he started Crestor and he denies any right upper quadrant pain, myalgias, nor any other known intolerance to medication. No formal exercise routine but he tries to ride his bike a few times a week.  He informs me that he has run out of Pletal for the past one or 2 months. He was under the impression that this was a blood pressure medication and is requesting refills. He denies any limb claudication or peripheral vascular disease recently or remotely.  Followup BPH: Patient reports since starting on Flomax he continues to have a weak stream and urinary hesitancy. He does not awaken at night to urinate. He denies any other genitourinary complaints   Review Of Systems Outlined In HPI  Past Medical History  Diagnosis Date  . Anxiety   . Arthritis   . Hypertension   . Allergy     No past surgical history on file. Family History  Problem Relation Age of Onset  . Diabetes Mother   . Hypertension Mother   . Alzheimer's disease Mother   . Diabetes Father   . Hypertension Father     History   Social History  . Marital Status: Married    Spouse Name: N/A    Number of  Children: 7  . Years of Education: N/A   Occupational History  . Not on file.   Social History Main Topics  . Smoking status: Current Every Day Smoker -- 0.50 packs/day for 40 years    Types: Cigarettes  . Smokeless tobacco: Never Used  . Alcohol Use: Yes     Comment: occs  . Drug Use: No  . Sexual Activity: Yes   Other Topics Concern  . Not on file   Social History Narrative  . No narrative on file     Objective: BP 226/88  Pulse 61  Wt 175 lb (79.379 kg)  General: Alert and Oriented, No Acute Distress HEENT: Pupils equal, round, reactive to light. Conjunctivae clear.  External ears unremarkable, canals clear with intact TMs with appropriate landmarks.  Middle ear appears open without effusion. Pink inferior turbinates.  Moist mucous membranes, pharynx without inflammation nor lesions.  Neck supple without palpable lymphadenopathy nor abnormal masses. Lungs: Clear to auscultation bilaterally, no wheezing/ronchi/rales.  Comfortable work of breathing. Good air movement. Cardiac: Regular rate and rhythm. Normal S1/S2.  No murmurs, rubs, nor gallops. No carotid bruits  Extremities: No peripheral edema.  Strong peripheral pulses.  Mental Status: No depression, anxiety, nor agitation. Skin: Warm and dry.  Assessment & Plan: Travis Barajas was seen today for hypertension.  Diagnoses and associated orders for this visit:  HTN (  hypertension), malignant - olmesartan-hydrochlorothiazide (BENICAR HCT) 40-25 MG per tablet; Take 1 tablet by mouth daily. - amLODipine (NORVASC) 5 MG tablet; Take 1 tablet (5 mg total) by mouth daily. - metoprolol (LOPRESSOR) 100 MG tablet; Take 1 tablet (100 mg total) by mouth 2 (two) times daily.  Hyperlipidemia - Lipid panel  BPH (benign prostatic hyperplasia) - doxazosin (CARDURA) 2 MG tablet; Take 1 tablet (2 mg total) by mouth daily. For prostate.    Essential hypertension: Uncontrolled restart all blood pressure medications with followup in  2-4 weeks Hyperlipidemia: Due for lipid panel today BPH: Uncontrolled stop Flomax switching to doxazosin Discussed that there is no indication to restart Pletal unless limb claudication or rest pain in the extremities occurs.  Return in about 4 weeks (around 03/21/2014) for Blood Pressure Followup.

## 2014-02-22 ENCOUNTER — Telehealth: Payer: Self-pay | Admitting: Family Medicine

## 2014-02-22 LAB — LIPID PANEL
Cholesterol: 147 mg/dL (ref 0–200)
HDL: 36 mg/dL — AB (ref >39)
LDL Cholesterol: 85 mg/dL (ref 0–99)
TRIGLYCERIDES: 129 mg/dL (ref <150)
Total CHOL/HDL Ratio: 4.1 Ratio
VLDL: 26 mg/dL (ref 0–40)

## 2014-02-22 MED ORDER — ROSUVASTATIN CALCIUM 20 MG PO TABS: 20.0000 mg | ORAL_TABLET | Freq: Every day | ORAL | Status: DC

## 2014-02-22 NOTE — Telephone Encounter (Signed)
Travis Barajas, Will you please let patient know that his cholesterol has made a drastic improvement and his LDL cholesterol is now well controlled on rosuvastatin aka Crestor.  I've sent in refills for this and would encourage him to continue using it on a daily basis.

## 2014-02-22 NOTE — Telephone Encounter (Signed)
Pt.notified

## 2014-03-16 ENCOUNTER — Other Ambulatory Visit: Payer: Self-pay | Admitting: Family Medicine

## 2014-03-21 ENCOUNTER — Ambulatory Visit (INDEPENDENT_AMBULATORY_CARE_PROVIDER_SITE_OTHER): Payer: 59 | Admitting: Family Medicine

## 2014-03-21 ENCOUNTER — Encounter: Payer: Self-pay | Admitting: Family Medicine

## 2014-03-21 VITALS — BP 189/91 | HR 85 | Wt 174.0 lb

## 2014-03-21 DIAGNOSIS — N400 Enlarged prostate without lower urinary tract symptoms: Secondary | ICD-10-CM

## 2014-03-21 DIAGNOSIS — F411 Generalized anxiety disorder: Secondary | ICD-10-CM

## 2014-03-21 DIAGNOSIS — I10 Essential (primary) hypertension: Secondary | ICD-10-CM

## 2014-03-21 DIAGNOSIS — J449 Chronic obstructive pulmonary disease, unspecified: Secondary | ICD-10-CM

## 2014-03-21 DIAGNOSIS — M2550 Pain in unspecified joint: Secondary | ICD-10-CM

## 2014-03-21 DIAGNOSIS — N529 Male erectile dysfunction, unspecified: Secondary | ICD-10-CM

## 2014-03-21 MED ORDER — TIOTROPIUM BROMIDE MONOHYDRATE 18 MCG IN CAPS: 18.0000 ug | ORAL_CAPSULE | Freq: Every day | RESPIRATORY_TRACT | Status: DC

## 2014-03-21 MED ORDER — VITAMIN D 1000 UNITS PO TABS: 1000.0000 [IU] | ORAL_TABLET | Freq: Every day | ORAL | Status: DC

## 2014-03-21 MED ORDER — ALPRAZOLAM 0.5 MG PO TABS: 0.5000 mg | ORAL_TABLET | Freq: Three times a day (TID) | ORAL | Status: DC | PRN

## 2014-03-21 MED ORDER — AVANAFIL 100 MG PO TABS: 100.0000 mg | ORAL_TABLET | Freq: Every day | ORAL | Status: DC

## 2014-03-21 MED ORDER — NAPROXEN 500 MG PO TABS: ORAL_TABLET | ORAL | Status: DC

## 2014-03-21 MED ORDER — ROSUVASTATIN CALCIUM 20 MG PO TABS: 20.0000 mg | ORAL_TABLET | Freq: Every day | ORAL | Status: DC

## 2014-03-21 NOTE — Progress Notes (Signed)
CC: Travis Barajas is a 63 y.o. male is here for Hypertension   Subjective: HPI:  Followup polyarthralgia: Requesting refills on naproxen which he takes one to 2 times a day for chronic bilateral knee pain. Over the past 3 months he denies any worsening of the pain nor any new motor or sensory disturbances. Symptoms are described as pain deep within her joints nonradiating and improves with as needed naproxen  Followup hypertension: I saw him last I ensured that he had doxazosin, metoprolol, and Benicar HCT. He tells me that he took these medications on a consistent schedule basis for 2 weeks after he saw me and all blood pressures were between systolic 660-630 and diastolics in the 16W. Since then he admits that his dosing pattern has been more irregular due to general forgetfulness and has not had any blood pressures since deviating from taking these medications as prescribed. Denies chest pain shortness of breath orthopnea nor peripheral edema  Followup BPH: Continues to takes doxazosin on a daily basis. Denies urinary hesitancy, urgency nor awakening more than once to urinate at night  Followup COPD: He is requesting refills on Spiriva. He's been out of his medications for 2 days and has not had any return of shortness of breath but does have an occasional cough but has returned. He's been using this medication for months to years and has been successful for treating shortness of breath and cough. Denies fevers, wheezing, chills  Followup anxiety: Is requesting a refill on Xanax. He reports he takes his only for anxiousness and irritability less thanof the week and typically no more than one to 2 doses a day when needed. Denies any new mental disturbance in states the quality of life from an anxiety standpoint satisfactory with his current regimen  Followup erectile dysfunction: He is requesting guidance on medications to help with erectile dysfunction. Cialis was ineffective, Viagra caused  an intolerable headache and blurred vision. He denies exertional chest pain nor taking any nitrates  Review Of Systems Outlined In HPI  Past Medical History  Diagnosis Date  . Anxiety   . Arthritis   . Hypertension   . Allergy     No past surgical history on file. Family History  Problem Relation Age of Onset  . Diabetes Mother   . Hypertension Mother   . Alzheimer's disease Mother   . Diabetes Father   . Hypertension Father     History   Social History  . Marital Status: Married    Spouse Name: N/A    Number of Children: 7  . Years of Education: N/A   Occupational History  . Not on file.   Social History Main Topics  . Smoking status: Current Every Day Smoker -- 0.50 packs/day for 40 years    Types: Cigarettes  . Smokeless tobacco: Never Used  . Alcohol Use: Yes     Comment: occs  . Drug Use: No  . Sexual Activity: Yes   Other Topics Concern  . Not on file   Social History Narrative  . No narrative on file     Objective: BP 189/91  Pulse 85  Wt 174 lb (78.926 kg)  General: Alert and Oriented, No Acute Distress HEENT: Pupils equal, round, reactive to light. Conjunctivae clear.  Moist membranes pharynx unremarkable Lungs: Clear to auscultation bilaterally, no wheezing/ronchi/rales.  Comfortable work of breathing. Good air movement. Cardiac: Regular rate and rhythm. Normal S1/S2.  No murmurs, rubs, nor gallops.   Extremities: No peripheral edema.  Strong peripheral pulses.  Mental Status: No depression, anxiety, nor agitation. Skin: Warm and dry.  Assessment & Plan: Travis Barajas was seen today for hypertension.  Diagnoses and associated orders for this visit:  Polyarthralgia - naproxen (NAPROSYN) 500 MG tablet; TAKE 1 TABLET BY MOUTH TWICE DAILY, WITH MEALS PRN Joint Pain  HTN (hypertension), malignant  BPH (benign prostatic hyperplasia)  COPD, moderate - tiotropium (SPIRIVA HANDIHALER) 18 MCG inhalation capsule; Place 1 capsule (18 mcg total)  into inhaler and inhale daily.  Anxiety state - ALPRAZolam (XANAX) 0.5 MG tablet; Take 1 tablet (0.5 mg total) by mouth 3 (three) times daily as needed.  Erectile dysfunction, unspecified erectile dysfunction type  Other Orders - rosuvastatin (CRESTOR) 20 MG tablet; Take 1 tablet (20 mg total) by mouth daily. - cholecalciferol (VITAMIN D) 1000 UNITS tablet; Take 1 tablet (1,000 Units total) by mouth daily. - Avanafil 100 MG TABS; Take 100 mg by mouth daily. PRN sex.    Polyarthralgia: Refills on naproxen, controlled Hypertension: Uncontrolled and not taking medications however it appears he is well controlled when taking medications as prescribed, encouraged him to focus hard on taking them as prescribed, he expresses optimism that this is achievable COPD: Controlled continue Spiriva Anxiety: Controlled continue his Xanax Erectile dysfunction: Uncontrolled trying Avanafil with a savings voucher provided BPH: Controlled continue doxazosin  40 minutes spent face-to-face during visit today of which at least 50% was counseling or coordinating care regarding: 1. Polyarthralgia   2. HTN (hypertension), malignant   3. BPH (benign prostatic hyperplasia)   4. COPD, moderate   5. Anxiety state   6. Erectile dysfunction, unspecified erectile dysfunction type        Return in about 3 months (around 06/21/2014).

## 2014-04-04 ENCOUNTER — Ambulatory Visit (INDEPENDENT_AMBULATORY_CARE_PROVIDER_SITE_OTHER): Payer: 59 | Admitting: Family Medicine

## 2014-04-04 ENCOUNTER — Encounter: Payer: Self-pay | Admitting: Family Medicine

## 2014-04-04 VITALS — BP 188/94 | HR 72 | Temp 97.8°F | Wt 171.0 lb

## 2014-04-04 DIAGNOSIS — J329 Chronic sinusitis, unspecified: Secondary | ICD-10-CM

## 2014-04-04 DIAGNOSIS — A499 Bacterial infection, unspecified: Secondary | ICD-10-CM

## 2014-04-04 DIAGNOSIS — B9689 Other specified bacterial agents as the cause of diseases classified elsewhere: Secondary | ICD-10-CM

## 2014-04-04 MED ORDER — AMOXICILLIN-POT CLAVULANATE 500-125 MG PO TABS: ORAL_TABLET | ORAL | Status: AC

## 2014-04-04 NOTE — Progress Notes (Signed)
CC: Travis Barajas is a 63 y.o. male is here for Nasal Congestion   Subjective: HPI:  Nasal congestion with facial pressure in the forehead and beneath the eyes that has been present for the past week and a half on a daily basis. Was slowly worsening up until Monday when he began using his wife's Augmentin, he's currently taking this 3 times a day. Over the past 24 hours he's had a moderate improvement of his symptoms. No other interventions as of yet. Nothing else seems to make better or worse. Accompanied by nonproductive cough and originally with fevers but this has not resolved. Denies chest pain shortness of breath wheezing nor motor or sensory disturbances   Review Of Systems Outlined In HPI  Past Medical History  Diagnosis Date  . Anxiety   . Arthritis   . Hypertension   . Allergy     No past surgical history on file. Family History  Problem Relation Age of Onset  . Diabetes Mother   . Hypertension Mother   . Alzheimer's disease Mother   . Diabetes Father   . Hypertension Father     History   Social History  . Marital Status: Married    Spouse Name: N/A    Number of Children: 7  . Years of Education: N/A   Occupational History  . Not on file.   Social History Main Topics  . Smoking status: Current Every Day Smoker -- 0.50 packs/day for 40 years    Types: Cigarettes  . Smokeless tobacco: Never Used  . Alcohol Use: Yes     Comment: occs  . Drug Use: No  . Sexual Activity: Yes   Other Topics Concern  . Not on file   Social History Narrative  . No narrative on file     Objective: BP 188/94  Pulse 72  Temp(Src) 97.8 F (36.6 C) (Oral)  Wt 171 lb (77.565 kg)  General: Alert and Oriented, No Acute Distress HEENT: Pupils equal, round, reactive to light. Conjunctivae clear.  External ears unremarkable, canals clear with intact TMs with appropriate landmarks.  Middle ear appears open without effusion. Boggy erythematous inferior turbinates with moderate  mucoid discharge.  Moist mucous membranes, pharynx without inflammation nor lesions however moderate cobblestoning.  Neck supple without palpable lymphadenopathy nor abnormal masses. Lungs: Clear to auscultation bilaterally, no wheezing/ronchi/rales.  Comfortable work of breathing. Good air movement. Extremities: No peripheral edema.  Strong peripheral pulses.  Mental Status: No depression, anxiety, nor agitation. Skin: Warm and dry.  Assessment & Plan: Toney Difatta was seen today for nasal congestion.  Diagnoses and associated orders for this visit:  Bacterial sinusitis - amoxicillin-clavulanate (AUGMENTIN) 500-125 MG per tablet; Take one by mouth every 8 hours for ten total days.    Bacterial sinusitis: He was given a prescription of Augmentin for his end use, encouraged him not to use other individuals prescription medications. Supplement therapy with nasal saline washes.  Return if symptoms worsen or fail to improve.

## 2014-04-11 ENCOUNTER — Telehealth: Payer: Self-pay

## 2014-04-11 NOTE — Telephone Encounter (Signed)
Since he has not been seen for a problem associated with getting rx for tramadol he would need a rx. Advised pt of this and said if his pain was especially bad then he could go to [facility]

## 2014-04-11 NOTE — Telephone Encounter (Signed)
Travis Barajas called and states Dr Ileene Rubens was going to give him a refill on tramadol at his last office visit. I do not see any mention of a refill of tramadol. I will send message to [name].

## 2014-04-17 ENCOUNTER — Ambulatory Visit (INDEPENDENT_AMBULATORY_CARE_PROVIDER_SITE_OTHER): Payer: 59 | Admitting: Family Medicine

## 2014-04-17 ENCOUNTER — Encounter: Payer: Self-pay | Admitting: Family Medicine

## 2014-04-17 VITALS — BP 188/93 | HR 86 | Wt 178.0 lb

## 2014-04-17 DIAGNOSIS — J449 Chronic obstructive pulmonary disease, unspecified: Secondary | ICD-10-CM

## 2014-04-17 DIAGNOSIS — M2550 Pain in unspecified joint: Secondary | ICD-10-CM

## 2014-04-17 DIAGNOSIS — I10 Essential (primary) hypertension: Secondary | ICD-10-CM

## 2014-04-17 DIAGNOSIS — N400 Enlarged prostate without lower urinary tract symptoms: Secondary | ICD-10-CM

## 2014-04-17 MED ORDER — BUDESONIDE-FORMOTEROL FUMARATE 160-4.5 MCG/ACT IN AERO: 2.0000 | INHALATION_SPRAY | Freq: Two times a day (BID) | RESPIRATORY_TRACT | Status: DC

## 2014-04-17 MED ORDER — DOXAZOSIN MESYLATE 4 MG PO TABS: 4.0000 mg | ORAL_TABLET | Freq: Every day | ORAL | Status: DC

## 2014-04-17 MED ORDER — TRAMADOL HCL 50 MG PO TABS: 50.0000 mg | ORAL_TABLET | Freq: Four times a day (QID) | ORAL | Status: DC | PRN

## 2014-04-17 NOTE — Progress Notes (Signed)
CC: Travis Barajas is a 63 y.o. male is here for pain in arms and legs   Subjective: HPI:  Follow-up hypertension: Continues to take amlodipine, metoprolol, Benicar HCT on a daily basis but only taking 2 mg of doxazosin on a daily basis. No outside blood pressures to report. Denies chest pain shortness breath orthopnea nor peripheral edema  Follow-up RJJ:[id number] to takes doxazosin 2 mg daily. Only waking once at night to urinate. Denies urinary urgency hesitancy nor frequency.  Follow-up COPD: He reports that he is using albuterol at least once a day for the past month. He denies any nocturnal symptoms. It's accompanied by occasional wheezing without cough or shortness of breath. His last cigarette was on Friday.  Follow-up joint pain: He reports chronic knee pain, elbow pain, wrist pain that has been present on a daily basis for the past 2-3 years. It is moderately improved with Naprosyn and in the past has been completely relieved with taking 50 mg of Ultram when symptoms are moderate to severe in severity which occur 2-3 times a week. Symptoms are worse with change in barometric pressure. Nothing else seems to make better or worse. Prior interventions include a unremarkable rheumatology panel.  Review Of Systems Outlined In HPI  Past Medical History  Diagnosis Date  . Anxiety   . Arthritis   . Hypertension   . Allergy     No past surgical history on file. Family History  Problem Relation Age of Onset  . Diabetes Mother   . Hypertension Mother   . Alzheimer's disease Mother   . Diabetes Father   . Hypertension Father     History   Social History  . Marital Status: Married    Spouse Name: N/A    Number of Children: 7  . Years of Education: N/A   Occupational History  . Not on file.   Social History Main Topics  . Smoking status: Current Every Day Smoker -- 0.50 packs/day for 40 years    Types: Cigarettes  . Smokeless tobacco: Never Used  . Alcohol Use: Yes   Comment: occs  . Drug Use: No  . Sexual Activity: Yes   Other Topics Concern  . Not on file   Social History Narrative     Objective: BP 188/93 mmHg  Pulse 86  Wt 178 lb (80.74 kg)  General: Alert and Oriented, No Acute Distress HEENT: Pupils equal, round, reactive to light. Conjunctivae clear.  Moistness membranes pharynx unremarkable Lungs: Clear to auscultation bilaterally, no wheezing/ronchi/rales.  Comfortable work of breathing. Good air movement. Cardiac: Regular rate and rhythm. Normal S1/S2.  No murmurs, rubs, nor gallops.   Extremities: No peripheral edema.  Strong peripheral pulses. Full range of motion of both upper and lower extremities without restriction nor swelling of the joints Mental Status: No depression, anxiety, nor agitation. Skin: Warm and dry.  Assessment & Plan: Travis Barajas was seen today for pain in arms and legs.  Diagnoses and associated orders for this visit:  HTN (hypertension), malignant - doxazosin (CARDURA) 4 MG tablet; Take 1 tablet (4 mg total) by mouth daily. For prostate.  BPH (benign prostatic hyperplasia) - doxazosin (CARDURA) 4 MG tablet; Take 1 tablet (4 mg total) by mouth daily. For prostate.  COPD, moderate - budesonide-formoterol (SYMBICORT) 160-4.5 MCG/ACT inhaler; Inhale 2 puffs into the lungs 2 (two) times daily.  Joint pain - traMADol (ULTRAM) 50 MG tablet; Take 1-2 tablets (50-100 mg total) by mouth every 6 (six) hours as needed.  Hypertension: Uncontrolled increase doxazosin BPH: Continue doxazosin, controlled COPD: Uncontrolled start Symbicort Joint pain: Controlled with naproxen and as needed tramadol  Return in about 3 months (around 07/18/2014) for HTN.

## 2014-05-30 ENCOUNTER — Other Ambulatory Visit: Payer: Self-pay | Admitting: Family Medicine

## 2014-06-12 ENCOUNTER — Encounter: Payer: Self-pay | Admitting: Family Medicine

## 2014-06-12 ENCOUNTER — Ambulatory Visit (INDEPENDENT_AMBULATORY_CARE_PROVIDER_SITE_OTHER): Payer: 59 | Admitting: Family Medicine

## 2014-06-12 VITALS — BP 186/95 | HR 93 | Temp 98.4°F | Wt 173.0 lb

## 2014-06-12 DIAGNOSIS — I10 Essential (primary) hypertension: Secondary | ICD-10-CM

## 2014-06-12 DIAGNOSIS — J449 Chronic obstructive pulmonary disease, unspecified: Secondary | ICD-10-CM

## 2014-06-12 DIAGNOSIS — N529 Male erectile dysfunction, unspecified: Secondary | ICD-10-CM

## 2014-06-12 DIAGNOSIS — N400 Enlarged prostate without lower urinary tract symptoms: Secondary | ICD-10-CM

## 2014-06-12 MED ORDER — FLUTICASONE FUROATE-VILANTEROL 100-25 MCG/INH IN AEPB: INHALATION_SPRAY | RESPIRATORY_TRACT | Status: DC

## 2014-06-12 NOTE — Progress Notes (Signed)
CC: Travis Barajas is a 63 y.o. male is here for cough and congestion   Subjective: HPI:  Complains of cough, shortness of breath and wheezing that has been present for matter of years however seems to be worsening over the past 2 weeks. His wife has noticed these symptoms are more affecting his quality of life and she is the one that made this appointment today for him. He tells me he uses Symbicort and Spiriva only when he feels like his breathing is getting worse, he uses albuterol only for fast acting relief of the above symptoms. The albuterol does help but only temporarily. For the past week he's been doing Symbicort and Spiriva on a daily basis. He describes his cough as dry. Shortness of breath is only present with exertion. He denies orthopnea chest pain nor peripheral edema. Denies fevers, chills, nasal congestion or facial pain.  Follow-up BPH: Despite increasing doxazosin at his last visit states that there's been no benefit to his weak stream and postvoid dribbling. He also mentions that avanafil was completely ineffective in helping with initiation or maintaining his erections.  Follow-up hypertension: He tells my medical assistant that he is taking all of his antihypertensive medications however has told me that he takes them sporadically.   Review Of Systems Outlined In HPI  Past Medical History  Diagnosis Date  . Anxiety   . Arthritis   . Hypertension   . Allergy     No past surgical history on file. Family History  Problem Relation Age of Onset  . Diabetes Mother   . Hypertension Mother   . Alzheimer's disease Mother   . Diabetes Father   . Hypertension Father     History   Social History  . Marital Status: Married    Spouse Name: N/A    Number of Children: 7  . Years of Education: N/A   Occupational History  . Not on file.   Social History Main Topics  . Smoking status: Current Every Day Smoker -- 0.50 packs/day for 40 years    Types: Cigarettes  .  Smokeless tobacco: Never Used  . Alcohol Use: Yes     Comment: occs  . Drug Use: No  . Sexual Activity: Yes   Other Topics Concern  . Not on file   Social History Narrative     Objective: BP 186/95 mmHg  Pulse 93  Temp(Src) 98.4 F (36.9 C) (Oral)  Wt 173 lb (78.472 kg)  SpO2 100%  General: Alert and Oriented, No Acute Distress HEENT: Pupils equal, round, reactive to light. Conjunctivae clear.  External ears unremarkable, canals clear with intact TMs with appropriate landmarks.  Middle ear appears open without effusion. Pink inferior turbinates.  Moist mucous membranes, pharynx without inflammation nor lesions.  Neck supple without palpable lymphadenopathy nor abnormal masses. Lungs: Clear to auscultation bilaterally, no wheezing/ronchi/rales.  Comfortable work of breathing. Good air movement. Cardiac: Regular rate and rhythm. Normal S1/S2.  No murmurs, rubs, nor gallops.   Extremities: No peripheral edema.  Strong peripheral pulses.  Mental Status: No depression, anxiety, nor agitation. Skin: Warm and dry.  Assessment & Plan: Travis Barajas was seen today for cough and congestion.  Diagnoses and associated orders for this visit:  HTN (hypertension), malignant  COPD, moderate - Fluticasone Furoate-Vilanterol (BREO ELLIPTA) 100-25 MCG/INH AEPB; One inhalation daily.  BPH (benign prostatic hyperplasia) - Ambulatory referral to Urology  Erectile dysfunction, unspecified erectile dysfunction type - Ambulatory referral to Urology    Essential hypertension:  Uncontrolled chronic condition and advised him to take all medications as prescribed, he was provided with a after visit summary listing every medication that I recommended he be on for optimal blood pressure control. COPD: Uncontrolled chronic condition, stopping Symbicort, change to Breo to help with compliance and financially this should be free for a year for him. Advised to stop Symbicort. Advised to take Spiriva on a  daily basis regardless of how he feels his breathing is doing. Continue as needed albuterol BPH and erectile dysfunction: Joint decision for referral to urology for further management.   Return in about 4 weeks (around 07/10/2014) for COPD check.

## 2014-06-16 ENCOUNTER — Other Ambulatory Visit: Payer: Self-pay | Admitting: Physician Assistant

## 2014-06-18 ENCOUNTER — Telehealth: Payer: Self-pay

## 2014-06-18 MED ORDER — METHYLPREDNISOLONE (PAK) 4 MG PO TABS: ORAL_TABLET | ORAL | Status: DC

## 2014-06-18 NOTE — Telephone Encounter (Signed)
Continue using the new inhaler and begin short course of methylprednisolone I'v'e sent to [facility].

## 2014-06-18 NOTE — Telephone Encounter (Signed)
Patient states his is still coughing, sneezing and has a runny nose. He has been taking the inhaler twice daily as directed. He was advised to call back if no improvement. Please advise.

## 2014-06-19 NOTE — Telephone Encounter (Signed)
Pt.notified

## 2014-06-21 ENCOUNTER — Other Ambulatory Visit: Payer: Self-pay | Admitting: Family Medicine

## 2014-06-22 ENCOUNTER — Telehealth: Payer: Self-pay

## 2014-06-22 MED ORDER — BENZONATATE 200 MG PO CAPS: 200.0000 mg | ORAL_CAPSULE | Freq: Two times a day (BID) | ORAL | Status: DC | PRN

## 2014-06-22 NOTE — Telephone Encounter (Signed)
Dr Ileene Rubens, Patient left a message stating he would like cough medication. Is cough medication a prescription you will prescribe on a telephone note or should I advise patient to schedule an appointment?

## 2014-06-22 NOTE — Telephone Encounter (Signed)
I've sent an Rx of tessalon pearls to [facility] since I saw him for coughing recently there's no need for an appointment.

## 2014-06-22 NOTE — Telephone Encounter (Signed)
Left detailed message.   

## 2014-07-16 ENCOUNTER — Other Ambulatory Visit: Payer: Self-pay | Admitting: Family Medicine

## 2014-07-17 ENCOUNTER — Ambulatory Visit (INDEPENDENT_AMBULATORY_CARE_PROVIDER_SITE_OTHER): Payer: Self-pay | Admitting: Family Medicine

## 2014-07-17 ENCOUNTER — Encounter: Payer: Self-pay | Admitting: Family Medicine

## 2014-07-17 ENCOUNTER — Telehealth: Payer: Self-pay | Admitting: Family Medicine

## 2014-07-17 VITALS — BP 178/81 | HR 77 | Temp 97.5°F | Wt 177.0 lb

## 2014-07-17 DIAGNOSIS — I10 Essential (primary) hypertension: Secondary | ICD-10-CM

## 2014-07-17 DIAGNOSIS — J329 Chronic sinusitis, unspecified: Secondary | ICD-10-CM

## 2014-07-17 DIAGNOSIS — A499 Bacterial infection, unspecified: Secondary | ICD-10-CM

## 2014-07-17 DIAGNOSIS — B9689 Other specified bacterial agents as the cause of diseases classified elsewhere: Secondary | ICD-10-CM

## 2014-07-17 MED ORDER — AMOXICILLIN-POT CLAVULANATE 500-125 MG PO TABS: ORAL_TABLET | ORAL | Status: AC

## 2014-07-17 MED ORDER — OXYMETAZOLINE HCL 0.05 % NA SOLN: 1.0000 | Freq: Two times a day (BID) | NASAL | Status: DC

## 2014-07-17 NOTE — Progress Notes (Signed)
CC: Travis Barajas is a 64 y.o. male is here for Sinusitis   Subjective: HPI:  Facial pressure localized in the forehead centrally and to the left that has been present for the past week comes and goes throughout the day. Mild to moderate in severity. Accompanied by thick nasal congestion difficulty breathing out of the nose and nonproductive cough. Accompanied by wheezing to a mild degree. Interventions have included Mucinex and hot beverages that are only mildly helping the symptoms above. Symptoms can occur any hours of the day. Nothing seems to make them worse. Denies fevers, chills, blood in sputum, chest pain, shortness of breath nor back pain.  I pointed out his blood is elevated again today. He admits to sporadically taking blood pressure medication due to forgetfulness. He would like to know if he can see a physician that specializes in blood pressure to better manage his blood pressure.     Review Of Systems Outlined In HPI  Past Medical History  Diagnosis Date  . Anxiety   . Arthritis   . Hypertension   . Allergy     No past surgical history on file. Family History  Problem Relation Age of Onset  . Diabetes Mother   . Hypertension Mother   . Alzheimer's disease Mother   . Diabetes Father   . Hypertension Father     History   Social History  . Marital Status: Married    Spouse Name: N/A    Number of Children: 7  . Years of Education: N/A   Occupational History  . Not on file.   Social History Main Topics  . Smoking status: Current Every Day Smoker -- 0.50 packs/day for 40 years    Types: Cigarettes  . Smokeless tobacco: Never Used  . Alcohol Use: Yes     Comment: occs  . Drug Use: No  . Sexual Activity: Yes   Other Topics Concern  . Not on file   Social History Narrative     Objective: BP 178/81 mmHg  Pulse 77  Temp(Src) 97.5 F (36.4 C) (Oral)  Wt 177 lb (80.287 kg)  General: Alert and Oriented, No Acute Distress HEENT: Pupils equal,  round, reactive to light. Conjunctivae clear.  External ears unremarkable, canals clear with intact TMs with appropriate landmarks.  Middle ear appears open without effusionBoggy erythematous and her turbinates with moderate mucoid discharge.  Moist mucous membranes, pharynx without inflammation nor lesionsOther than moderate cobblestoning.  Neck supple without palpable lymphadenopathy nor abnormal masses. Lungs: Clear to auscultation bilaterally, no wheezing/ronchi/rales.  Comfortable work of breathing. Good air movement. Cardiac: Regular rate and rhythm. Normal S1/S2.  No murmurs, rubs, nor gallops.   Extremities: No peripheral edema.  Strong peripheral pulses.  Mental Status: No depression, anxiety, nor agitation. Skin: Warm and dry.  Assessment & Plan: [name] was seen today for sinusitis.  Diagnoses and associated orders for this visit:  HTN (hypertension), malignant - Ambulatory referral to Cardiology  Bacterial sinusitis - amoxicillin-clavulanate (AUGMENTIN) 500-125 MG per tablet; Take one by mouth every 8 hours for ten total days.  Other Orders - oxymetazoline (AFRIN SINUS) 0.05 % nasal spray; Place 1 spray into both nostrils 2 (two) times daily. Only for 3 days.    Bacterial sinusitis: Start Augmentin consider nasal saline washes and Afrin , continue Mucinex products   essential hypertension: Uncontrolled chronic condition heavily influenced by noncompliance, per his request will refer to cardiology for second opinion on blood pressure medication management, stressed that compliance would  help with blood pressure.  Return if symptoms worsen or fail to improve.

## 2014-07-17 NOTE — Telephone Encounter (Signed)
Wonderful, thank you Mardene Celeste.

## 2014-07-17 NOTE — Telephone Encounter (Signed)
Dr Ileene Rubens, I called [facility] to follow up on referral. I spoke with Sharyn Lull and she said they had not gotten referral though the fax number was correct. I have faxed referral to another fax number that she gave to me.  I have updated the patient.

## 2014-08-21 ENCOUNTER — Ambulatory Visit (INDEPENDENT_AMBULATORY_CARE_PROVIDER_SITE_OTHER): Payer: 59 | Admitting: Family Medicine

## 2014-08-21 ENCOUNTER — Encounter: Payer: Self-pay | Admitting: Family Medicine

## 2014-08-21 VITALS — BP 173/91 | HR 117 | Temp 99.4°F | Wt 177.0 lb

## 2014-08-21 DIAGNOSIS — A499 Bacterial infection, unspecified: Secondary | ICD-10-CM

## 2014-08-21 DIAGNOSIS — B9689 Other specified bacterial agents as the cause of diseases classified elsewhere: Secondary | ICD-10-CM

## 2014-08-21 DIAGNOSIS — J449 Chronic obstructive pulmonary disease, unspecified: Secondary | ICD-10-CM

## 2014-08-21 DIAGNOSIS — J329 Chronic sinusitis, unspecified: Secondary | ICD-10-CM

## 2014-08-21 MED ORDER — HYDROCODONE-HOMATROPINE 5-1.5 MG/5ML PO SYRP: 5.0000 mL | ORAL_SOLUTION | Freq: Three times a day (TID) | ORAL | Status: DC | PRN

## 2014-08-21 MED ORDER — CEFDINIR 300 MG PO CAPS: 300.0000 mg | ORAL_CAPSULE | Freq: Two times a day (BID) | ORAL | Status: AC

## 2014-08-21 NOTE — Progress Notes (Signed)
CC: Travis Barajas is a 64 y.o. male is here for URI?   Subjective: HPI:  Facial pressure localized in the forehead that is radiating into the cheeks. Moderate in severity and has been present for a little under a week now. Fluctuates throughout the day. Slight improvement with guaifenesin. Nothing else seems to make better or worse. Acompany by subjective postnasal drip. Nonproductive cough that is more frequent than his baseline cough. Subjective fevers and chills over the past 3 days with mild diffuse myalgias.  Denies shortness of breath, wheezing, nor blood in sputum. No confusion, dysphagia, rash nor night sweats.   Review Of Systems Outlined In HPI  Past Medical History  Diagnosis Date  . Anxiety   . Arthritis   . Hypertension   . Allergy     No past surgical history on file. Family History  Problem Relation Age of Onset  . Diabetes Mother   . Hypertension Mother   . Alzheimer's disease Mother   . Diabetes Father   . Hypertension Father     History   Social History  . Marital Status: Married    Spouse Name: N/A  . Number of Children: 7  . Years of Education: N/A   Occupational History  . Not on file.   Social History Main Topics  . Smoking status: Current Every Day Smoker -- 0.50 packs/day for 40 years    Types: Cigarettes  . Smokeless tobacco: Never Used  . Alcohol Use: Yes     Comment: occs  . Drug Use: No  . Sexual Activity: Yes   Other Topics Concern  . Not on file   Social History Narrative     Objective: BP 173/91 mmHg  Pulse 117  Temp(Src) 99.4 F (37.4 C) (Oral)  Wt 177 lb (80.287 kg)  General: Alert and Oriented, No Acute Distress HEENT: Pupils equal, round, reactive to light. Conjunctivae clear.  External ears unremarkable, canals clear with intact TMs with appropriate landmarks.  Middle ear appearto have a serous effusion bilaterally with bulging tympanic membrane . Pink inferior turbinates.  Moist mucous membranes, pharynx without  inflammation nor lesions.  Neck supple without palpable lymphadenopathy nor abnormal masses. Lungs: Clear to auscultation bilaterally, no wheezing/ronchi/rales.  Comfortable work of breathing. Good air movement. Extremities: No peripheral edema.  Strong peripheral pulses.  Mental Status: No depression, anxiety, nor agitation. Skin: Warm and dry.  Assessment & Plan: Malick Netz was seen today for uri?.  Diagnoses and all orders for this visit:  COPD, moderate  Bacterial sinusitis Orders: -     cefdinir (OMNICEF) 300 MG capsule; Take 1 capsule (300 mg total) by mouth 2 (two) times daily. -     HYDROcodone-homatropine (HYCODAN) 5-1.5 MG/5ML syrup; Take 5 mLs by mouth every 8 (eight) hours as needed for cough.   COPD: Controlled lungs sound much better than I expected today. No change to inhaler regimen   bacterial sinusitis: Start Omnicef, as needed Hycodan especially to help with sleep. I've asked him to call me if he is no better by Thursday or has worsening wheezing or any shortness of breath, next step would be to send in a prednisone burst.  Return if symptoms worsen or fail to improve.

## 2014-08-24 ENCOUNTER — Telehealth: Payer: Self-pay

## 2014-08-24 MED ORDER — PREDNISONE 20 MG PO TABS: ORAL_TABLET | ORAL | Status: AC

## 2014-08-24 NOTE — Telephone Encounter (Signed)
Left message on vm

## 2014-08-24 NOTE — Telephone Encounter (Signed)
Travis Barajas, Prednisone sent to [facility], continue to use antibiotic given earlier this week.

## 2014-08-24 NOTE — Telephone Encounter (Signed)
Travis Barajas reports his symptoms have not improved. He still has a dry cough, congestion and runny nose. He was advised to call back if his symptoms persist or worsen.

## 2014-09-10 ENCOUNTER — Other Ambulatory Visit: Payer: Self-pay | Admitting: Family Medicine

## 2014-09-26 ENCOUNTER — Encounter: Payer: Self-pay | Admitting: Cardiology

## 2014-09-26 ENCOUNTER — Ambulatory Visit (INDEPENDENT_AMBULATORY_CARE_PROVIDER_SITE_OTHER): Payer: 59 | Admitting: Cardiology

## 2014-09-26 ENCOUNTER — Encounter: Payer: Self-pay | Admitting: *Deleted

## 2014-09-26 VITALS — BP 166/102 | HR 80 | Ht 65.0 in | Wt 113.0 lb

## 2014-09-26 DIAGNOSIS — I739 Peripheral vascular disease, unspecified: Secondary | ICD-10-CM | POA: Diagnosis not present

## 2014-09-26 DIAGNOSIS — R0600 Dyspnea, unspecified: Secondary | ICD-10-CM | POA: Diagnosis not present

## 2014-09-26 DIAGNOSIS — I10 Essential (primary) hypertension: Secondary | ICD-10-CM

## 2014-09-26 DIAGNOSIS — E785 Hyperlipidemia, unspecified: Secondary | ICD-10-CM | POA: Diagnosis not present

## 2014-09-26 MED ORDER — AMLODIPINE BESYLATE 10 MG PO TABS: 10.0000 mg | ORAL_TABLET | Freq: Every day | ORAL | Status: DC

## 2014-09-26 MED ORDER — ASPIRIN EC 81 MG PO TBEC: 81.0000 mg | DELAYED_RELEASE_TABLET | Freq: Every day | ORAL | Status: DC

## 2014-09-26 NOTE — Assessment & Plan Note (Signed)
Patient counseled on discontinuing. 

## 2014-09-26 NOTE — Assessment & Plan Note (Signed)
Patient has significant hypertension. I discussed lifestyle modification including low sodium diet and discontinuing tobacco use. He also discussed exercise and weight loss. He is not taking his medications approximately 25% of the time. I have explained the importance of taking his medications daily. Check potassium, renal function, hemoglobin and TSH. Increase amlodipine to 10 mg daily. Check blood pressure at home and keep records. He will bring his cuff for correlation with ours at next office visit.

## 2014-09-26 NOTE — Patient Instructions (Signed)
Your physician recommends that you schedule a follow-up appointment in: [place]  START ASPIRIN [address]  Your physician has requested that you have a lower extremity arteriaL duplex. During this test, ultrasound are used to evaluate arterial blood flow in the legs. Allow one hour for this exam. There are no restrictions or special instructions.    Your physician has requested that you have an echocardiogram. Echocardiography is a painless test that uses sound waves to create images of your heart. It provides your doctor with information about the size and shape of your heart and how well your heart's chambers and valves are working. This procedure takes approximately one hour. There are no restrictions for this procedure.   Your physician recommends that you HAVE LAB WORK TODAY  INCREASE AMLODIPINE TO 10 MG ONCE DAILY= 2 OF THE 5 MG TABLETS ONCE DAILY

## 2014-09-26 NOTE — Progress Notes (Signed)
HPI: 64 year old male for evaluation of hypertension. Nuclear study November 2006 normal. Patient does have dyspnea on exertion. No orthopnea, PND, pedal edema, chest pain or recent syncope. Patient does have claudication bilaterally after ambulating to the back of [employer].  Current Outpatient Prescriptions  Medication Sig Dispense Refill  . albuterol (PROVENTIL HFA;VENTOLIN HFA) 108 (90 BASE) MCG/ACT inhaler Inhale 2 puffs into the lungs every 6 (six) hours as needed.    . ALPRAZolam (XANAX) 0.5 MG tablet Take 1 tablet (0.5 mg total) by mouth 3 (three) times daily as needed. 60 tablet 2  . amLODipine (NORVASC) 5 MG tablet Take 1 tablet (5 mg total) by mouth daily. 90 tablet 1  . Avanafil 100 MG TABS Take 100 mg by mouth daily. PRN sex. 10 tablet 2  . doxazosin (CARDURA) 4 MG tablet Take 1 tablet (4 mg total) by mouth daily. For prostate. 90 tablet 1  . fluticasone (FLONASE) 50 MCG/ACT nasal spray Place 2 sprays into the nose daily.    . Fluticasone Furoate-Vilanterol (BREO ELLIPTA) 100-25 MCG/INH AEPB One inhalation daily. 60 each 11  . levocetirizine (XYZAL) 5 MG tablet TAKE 1 TABLET BY MOUTH EVERY DAY 30 tablet 0  . metoprolol (LOPRESSOR) 100 MG tablet Take 1 tablet (100 mg total) by mouth 2 (two) times daily. 180 tablet 1  . mometasone (NASONEX) 50 MCG/ACT nasal spray Place 2 sprays into the nose daily.    . naproxen (NAPROSYN) 500 MG tablet TAKE 1 TABLET BY MOUTH TWICE DAILY, WITH MEALS PRN Joint Pain 60 tablet 4  . olmesartan-hydrochlorothiazide (BENICAR HCT) 40-25 MG per tablet Take 1 tablet by mouth daily. 90 tablet 1  . oxymetazoline (AFRIN SINUS) 0.05 % nasal spray Place 1 spray into both nostrils 2 (two) times daily. Only for 3 days. 30 mL 0  . pantoprazole (PROTONIX) 40 MG tablet Take 40 mg by mouth daily.    . pentoxifylline (TRENTAL) 400 MG CR tablet Take 400 mg by mouth 3 (three) times daily with meals.   5  . rosuvastatin (CRESTOR) 20 MG tablet Take 1 tablet (20 mg total)  by mouth daily. 90 tablet 3  . tamsulosin (FLOMAX) 0.4 MG CAPS capsule Take 0.4 mg by mouth.    . tiotropium (SPIRIVA HANDIHALER) 18 MCG inhalation capsule Place 1 capsule (18 mcg total) into inhaler and inhale daily. 90 capsule 3  . traMADol (ULTRAM) 50 MG tablet Take 1-2 tablets (50-100 mg total) by mouth every 6 (six) hours as needed. 60 tablet 2   No current facility-administered medications for this visit.    Allergies  Allergen Reactions  . Cialis [Tadalafil]     Hallucinations   . Niacin And Related Itching     Past Medical History  Diagnosis Date  . Anxiety   . Arthritis   . Hypertension   . Allergy   . Hyperlipidemia   . COPD (chronic obstructive pulmonary disease)     Past Surgical History  Procedure Laterality Date  . Foot surgery      History   Social History  . Marital Status: Married    Spouse Name: N/A  . Number of Children: 7  . Years of Education: N/A   Occupational History  .      Retired   Social History Main Topics  . Smoking status: Current Every Day Smoker -- 0.50 packs/day for 40 years    Types: Cigarettes  . Smokeless tobacco: Never Used  . Alcohol Use: Yes  Comment: occs  . Drug Use: No  . Sexual Activity: Yes   Other Topics Concern  . Not on file   Social History Narrative    Family History  Problem Relation Age of Onset  . Diabetes Mother   . Hypertension Mother   . Alzheimer's disease Mother   . Diabetes Father   . Hypertension Father   . CAD Mother     ROS: arthralgias but no fevers or chills, productive cough, hemoptysis, dysphasia, odynophagia, melena, hematochezia, dysuria, hematuria, rash, seizure activity, orthopnea, PND, pedal edema. Remaining systems are negative.  Physical Exam:   Blood pressure 166/102, pulse 80, height 5\' 5"  (1.651 m), weight 113 lb (51.256 kg).  General:  Well developed/well nourished in NAD Skin warm/dry Patient not depressed No peripheral  clubbing Back-normal HEENT-normal/normal eyelids Neck supple/normal carotid upstroke bilaterally; no bruits; no JVD; no thyromegaly chest - CTA/ normal expansion CV - RRR/normal S1 and S2; no murmurs, rubs or gallops;  PMI nondisplaced Abdomen -NT/ND, no HSM, no mass, + bowel sounds, no bruit 2+ femoral pulses, no bruits Ext-no edema, chords; diminished distal pulses Neuro-grossly nonfocal  ECG sinus rhythm at a rate of 94. Left anterior fascicular block. Left atrial enlargement. No ST changes.

## 2014-09-26 NOTE — Assessment & Plan Note (Signed)
Continue statin. 

## 2014-09-26 NOTE — Assessment & Plan Note (Signed)
Add aspirin 81 mg daily. Discussed importance of discontinuing tobacco. Check ABIs with Doppler.

## 2014-09-26 NOTE — Assessment & Plan Note (Signed)
Question from COPD. Schedule echocardiogram to further assess LV function.

## 2014-09-27 ENCOUNTER — Ambulatory Visit (HOSPITAL_BASED_OUTPATIENT_CLINIC_OR_DEPARTMENT_OTHER)
Admission: RE | Admit: 2014-09-27 | Discharge: 2014-09-27 | Disposition: A | Discharge status: Home / Self Care | Payer: 59 | Source: Ambulatory Visit | Attending: Cardiology | Admitting: Cardiology

## 2014-09-27 ENCOUNTER — Other Ambulatory Visit: Payer: Self-pay | Admitting: *Deleted

## 2014-09-27 ENCOUNTER — Ambulatory Visit (HOSPITAL_COMMUNITY)
Admission: RE | Admit: 2014-09-27 | Discharge: 2014-09-27 | Disposition: A | Discharge status: Home / Self Care | Payer: 59 | Source: Ambulatory Visit | Attending: Cardiology | Admitting: Cardiology

## 2014-09-27 DIAGNOSIS — I739 Peripheral vascular disease, unspecified: Secondary | ICD-10-CM | POA: Diagnosis not present

## 2014-09-27 DIAGNOSIS — R0600 Dyspnea, unspecified: Secondary | ICD-10-CM | POA: Insufficient documentation

## 2014-09-27 LAB — CBC
HCT: 47 % (ref 39.0–52.0)
Hemoglobin: 15.1 g/dL (ref 13.0–17.0)
MCH: 26.3 pg (ref 26.0–34.0)
MCHC: 32.1 g/dL (ref 30.0–36.0)
MCV: 81.9 fL (ref 78.0–100.0)
PLATELETS: 120 10*3/uL — AB (ref 150–400)
RBC: 5.74 MIL/uL (ref 4.22–5.81)
RDW: 15.1 % (ref 11.5–15.5)
WBC: 6.8 10*3/uL (ref 4.0–10.5)

## 2014-09-27 LAB — BASIC METABOLIC PANEL WITH GFR
BUN: 11 mg/dL (ref 6–23)
CO2: 35 mEq/L — ABNORMAL HIGH (ref 19–32)
Calcium: 9.4 mg/dL (ref 8.4–10.5)
Chloride: 103 mEq/L (ref 96–112)
Creat: 0.69 mg/dL (ref 0.50–1.35)
GLUCOSE: 90 mg/dL (ref 70–99)
POTASSIUM: 4 meq/L (ref 3.5–5.3)
Sodium: 141 mEq/L (ref 135–145)

## 2014-09-27 LAB — TSH: TSH: 0.853 u[IU]/mL (ref 0.350–4.500)

## 2014-09-27 NOTE — Progress Notes (Signed)
2D Echo Performed 09/27/2014    Josi Roediger, RCS  

## 2014-09-27 NOTE — Progress Notes (Signed)
Arterial Duplex Lower Ext. Completed. Lenetta Piche, BS, RDMS, RVT  

## 2014-10-10 ENCOUNTER — Other Ambulatory Visit: Payer: Self-pay | Admitting: Family Medicine

## 2014-10-30 ENCOUNTER — Encounter (HOSPITAL_COMMUNITY): Payer: 59

## 2014-10-30 ENCOUNTER — Encounter: Payer: 59 | Admitting: Vascular Surgery

## 2014-11-01 ENCOUNTER — Encounter: Payer: Self-pay | Admitting: Vascular Surgery

## 2014-11-02 ENCOUNTER — Ambulatory Visit (INDEPENDENT_AMBULATORY_CARE_PROVIDER_SITE_OTHER): Payer: Medicare Other | Admitting: Vascular Surgery

## 2014-11-02 ENCOUNTER — Encounter: Payer: Self-pay | Admitting: Vascular Surgery

## 2014-11-02 ENCOUNTER — Other Ambulatory Visit: Payer: Self-pay

## 2014-11-02 ENCOUNTER — Encounter (HOSPITAL_COMMUNITY): Payer: 59

## 2014-11-02 VITALS — BP 159/82 | HR 84 | Resp 14 | Ht 64.0 in | Wt 173.0 lb

## 2014-11-02 DIAGNOSIS — I739 Peripheral vascular disease, unspecified: Secondary | ICD-10-CM

## 2014-11-02 NOTE — Progress Notes (Signed)
Patient name: Travis Barajas MRN: [id number] DOB: 11/22/1950 Sex: male   Referred by: Janice Norrie  Reason for referral:  Chief Complaint  Patient presents with  . New Evaluation    bilateral leg pain     HISTORY OF PRESENT ILLNESS: Patient resents today for discussion of lower extremity arterial insufficiency and also erectile dysfunction. He reports that he has limiting claudication in both right and left calf. He reports that this is equal discomfort. He does report with the further walking he can occasionally have discomfort into his thighs as well. He does not have any history of rest pain and does not have any history of arterial insufficiency causing tissue loss. He reports that he is a recurrent erectile dysfunction and this is been present for approximately 1-2 years. He reports that he tried Viagra with no improvement. He does have a long history of cigarette smoking. He reports that he has dropped from a pack a day down to 4-5 cigarettes per day. He denies any cardiac difficulty and denies any history of stroke. Does have a family history cardiovascular disease in his mother.  Past Medical History  Diagnosis Date  . Anxiety   . Arthritis   . Hypertension   . Allergy   . Hyperlipidemia   . COPD (chronic obstructive pulmonary disease)     Past Surgical History  Procedure Laterality Date  . Foot surgery      History   Social History  . Marital Status: Married    Spouse Name: N/A  . Number of Children: 7  . Years of Education: N/A   Occupational History  .      Retired   Social History Main Topics  . Smoking status: Current Every Day Smoker -- 0.50 packs/day for 40 years    Types: Cigarettes  . Smokeless tobacco: Never Used  . Alcohol Use: 0.0 oz/week    0 Standard drinks or equivalent per week     Comment: occs  . Drug Use: No  . Sexual Activity: Yes   Other Topics Concern  . Not on file   Social History Narrative    Family History  Problem  Relation Age of Onset  . Diabetes Mother   . Hypertension Mother   . Alzheimer's disease Mother   . CAD Mother   . Heart disease Mother   . Hyperlipidemia Mother   . Diabetes Father   . Hypertension Father   . Cancer Brother   . Peripheral vascular disease Brother   . Cancer Daughter     Allergies as of 11/02/2014 - Review Complete 11/02/2014  Allergen Reaction Noted  . Cialis [tadalafil]  11/01/2013  . Niacin and related Itching 11/01/2013    Current Outpatient Prescriptions on File Prior to Visit  Medication Sig Dispense Refill  . albuterol (PROVENTIL HFA;VENTOLIN HFA) 108 (90 BASE) MCG/ACT inhaler Inhale 2 puffs into the lungs every 6 (six) hours as needed.    . ALPRAZolam (XANAX) 0.5 MG tablet TAKE ONE TABLET BY MOUTH THREE TIMES DAILY AS NEEDED 60 tablet 0  . amLODipine (NORVASC) 10 MG tablet Take 1 tablet (10 mg total) by mouth daily. 30 tablet 12  . aspirin EC 81 MG tablet Take 1 tablet (81 mg total) by mouth daily. 90 tablet 3  . Avanafil 100 MG TABS Take 100 mg by mouth daily. PRN sex. 10 tablet 2  . doxazosin (CARDURA) 4 MG tablet Take 1 tablet (4 mg total) by mouth daily. For prostate. [place]  tablet 1  . fluticasone (FLONASE) 50 MCG/ACT nasal spray Place 2 sprays into the nose daily.    . Fluticasone Furoate-Vilanterol (BREO ELLIPTA) 100-25 MCG/INH AEPB One inhalation daily. 60 each 11  . levocetirizine (XYZAL) 5 MG tablet TAKE 1 TABLET BY MOUTH EVERY DAY 30 tablet 0  . metoprolol (LOPRESSOR) 100 MG tablet Take 1 tablet (100 mg total) by mouth 2 (two) times daily. 180 tablet 1  . mometasone (NASONEX) 50 MCG/ACT nasal spray Place 2 sprays into the nose daily.    . naproxen (NAPROSYN) 500 MG tablet TAKE 1 TABLET BY MOUTH TWICE DAILY, WITH MEALS PRN Joint Pain 60 tablet 4  . olmesartan-hydrochlorothiazide (BENICAR HCT) 40-25 MG per tablet Take 1 tablet by mouth daily. 90 tablet 1  . oxymetazoline (AFRIN SINUS) 0.05 % nasal spray Place 1 spray into both nostrils 2 (two) times  daily. Only for 3 days. 30 mL 0  . pantoprazole (PROTONIX) 40 MG tablet Take 40 mg by mouth daily.    . pentoxifylline (TRENTAL) 400 MG CR tablet Take 400 mg by mouth 3 (three) times daily with meals.   5  . rosuvastatin (CRESTOR) 20 MG tablet Take 1 tablet (20 mg total) by mouth daily. 90 tablet 3  . tamsulosin (FLOMAX) 0.4 MG CAPS capsule Take 0.4 mg by mouth.    . tiotropium (SPIRIVA HANDIHALER) 18 MCG inhalation capsule Place 1 capsule (18 mcg total) into inhaler and inhale daily. 90 capsule 3  . traMADol (ULTRAM) 50 MG tablet Take 1-2 tablets (50-100 mg total) by mouth every 6 (six) hours as needed. 60 tablet 2   No current facility-administered medications on file prior to visit.     REVIEW OF SYSTEMS:  Positives indicated with an "X"  CARDIOVASCULAR:  [ ]  chest pain   [ ]  chest pressure   [x ] palpitations   [ ]  orthopnea   [ ]  dyspnea on exertion   [x ] claudication   [ ]  rest pain   [ ]  DVT   [ ]  phlebitis PULMONARY:   [ ]  productive cough   [ ]  asthma   [x ] wheezing NEUROLOGIC:   [ ]  weakness  [ ]  paresthesias  [ ]  aphasia  [ ]  amaurosis  [ ]  dizziness HEMATOLOGIC:   [ ]  bleeding problems   [ ]  clotting disorders MUSCULOSKELETAL:  [ ]  joint pain   [ ]  joint swelling GASTROINTESTINAL: [ ]   blood in stool  [ ]   hematemesis GENITOURINARY:  [ ]   dysuria  [ ]   hematuria PSYCHIATRIC:  [ ]  history of major depression INTEGUMENTARY:  [ ]  rashes  [ ]  ulcers CONSTITUTIONAL:  [ ]  fever   [ ]  chills  PHYSICAL EXAMINATION:  General: The patient is a well-nourished male, in no acute distress. Vital signs are BP 159/82 mmHg  Pulse 84  Resp 14  Ht 5\' 4"  (1.626 m)  Wt 173 lb (78.472 kg)  BMI 29.68 kg/m2 Pulmonary: There is a good air exchange bilaterally without wheezing or rales. Abdomen: Soft and non-tender with normal pitch bowel sounds. Musculoskeletal: There are no major deformities.  There is no significant extremity pain. Neurologic: No focal weakness or paresthesias are  detected, Skin: There are no ulcer or rashes noted. Psychiatric: The patient has normal affect. Cardiovascular: There is a regular rate and rhythm without significant murmur appreciated. Carotid arteries without bruits bilaterally Pulse status 2+ radial and palpable femoral pulses bilaterally. Faint left popliteal pulse and absent right popliteal pulse. Absent  pedal pulses bilaterally  Vascular Lab Studies:  From 09/27/2014 and [facility] : [place] The right of 0.66 on the left 0.77. 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  Impression and Plan:  Had long discussion with patient regarding his peripheral vascular occlusive disease. I explained that his calf symptoms were related to arterial insufficiency. Excellent this is not limb threatening. He reports that he is severely limited is not willing to continue observation only. Also explained that there is an outside chance that he could have the iliac lesions that could be correctable causing his erectile dysfunction but this is less likely. Expanded procedure of arteriography and also the possibility of stenting. I explained the outstanding durability of iliac stenting and the risk of recurrence particularly with SFA stenting. We will schedule outpatient arteriogram and possible intervention at his earliest convenience. I did encourage him to continue with his smoking cessation program.    Delorese Sellin, Sherren Mocha [facility] Office: [phone]

## 2014-11-02 NOTE — Progress Notes (Signed)
Filed Vitals:   11/02/14 1501 11/02/14 1503  BP: 181/87 159/82  Pulse: 86 84  Resp: 14   Height: 5\' 4"  (1.626 m)   Weight: 173 lb (78.472 kg)    Body mass index is 29.68 kg/(m^2).

## 2014-11-05 ENCOUNTER — Other Ambulatory Visit: Payer: Self-pay | Admitting: *Deleted

## 2014-11-05 ENCOUNTER — Telehealth: Payer: Self-pay | Admitting: *Deleted

## 2014-11-05 ENCOUNTER — Other Ambulatory Visit: Payer: Self-pay | Admitting: Family Medicine

## 2014-11-05 ENCOUNTER — Telehealth: Payer: Self-pay

## 2014-11-05 DIAGNOSIS — M2550 Pain in unspecified joint: Secondary | ICD-10-CM

## 2014-11-05 DIAGNOSIS — I10 Essential (primary) hypertension: Secondary | ICD-10-CM

## 2014-11-05 DIAGNOSIS — N400 Enlarged prostate without lower urinary tract symptoms: Secondary | ICD-10-CM

## 2014-11-05 MED ORDER — DOXAZOSIN MESYLATE 4 MG PO TABS: 4.0000 mg | ORAL_TABLET | Freq: Every day | ORAL | Status: DC

## 2014-11-05 MED ORDER — LEVOCETIRIZINE DIHYDROCHLORIDE 5 MG PO TABS: 5.0000 mg | ORAL_TABLET | Freq: Every day | ORAL | Status: DC

## 2014-11-05 NOTE — Telephone Encounter (Signed)
Pt left vm stating that the vein dr wants to "open his veins" on Thurs but he's going to call & cancel that because he wants to do accupuncture first.

## 2014-11-05 NOTE — Telephone Encounter (Signed)
Patient requested Rx's be filled through [facility]. Please advise if refills are appropriate and dosage for Flomax.

## 2014-11-05 NOTE — Telephone Encounter (Signed)
Mr. Travis Barajas states that he would like to cancel procedure that is scheduled for this Wednesday, 11/07/14, with Dr. Donnetta Hutching. Patient states, he "needs a little bit longer to get things together." This nurse asked patient if he would like to go ahead and pick a different date and he replied, "no, I need to get with my wife." Instructed patient to call after speaking with his wife to reschedule aortogram. Patient verbalized understanding. Called [facility] and cancelled procedure with Santiago Glad.

## 2014-11-06 NOTE — Telephone Encounter (Signed)
Travis Barajas,  Refills appear appropriate, can you please provide him with a 90 day supply and one refill for all requested  meds?

## 2014-11-07 ENCOUNTER — Ambulatory Visit (HOSPITAL_COMMUNITY): Admission: RE | Admit: 2014-11-07 | Payer: Medicare Other | Source: Ambulatory Visit | Admitting: Vascular Surgery

## 2014-11-07 ENCOUNTER — Encounter (HOSPITAL_COMMUNITY): Admission: RE | Payer: Self-pay | Source: Ambulatory Visit

## 2014-11-07 SURGERY — ABDOMINAL AORTOGRAM
Anesthesia: LOCAL

## 2014-11-07 MED ORDER — TAMSULOSIN HCL 0.4 MG PO CAPS: 0.4000 mg | ORAL_CAPSULE | Freq: Every day | ORAL | Status: DC

## 2014-11-07 MED ORDER — NAPROXEN 500 MG PO TABS: ORAL_TABLET | ORAL | Status: DC

## 2014-11-07 MED ORDER — ALPRAZOLAM 0.5 MG PO TABS: 0.5000 mg | ORAL_TABLET | Freq: Three times a day (TID) | ORAL | Status: DC | PRN

## 2014-11-07 MED ORDER — TRAMADOL HCL 50 MG PO TABS: 50.0000 mg | ORAL_TABLET | Freq: Four times a day (QID) | ORAL | Status: DC | PRN

## 2014-11-07 MED ORDER — METOPROLOL TARTRATE 100 MG PO TABS: 100.0000 mg | ORAL_TABLET | Freq: Two times a day (BID) | ORAL | Status: DC

## 2014-11-14 ENCOUNTER — Other Ambulatory Visit: Payer: Self-pay | Admitting: *Deleted

## 2014-11-14 MED ORDER — LEVOCETIRIZINE DIHYDROCHLORIDE 5 MG PO TABS: 5.0000 mg | ORAL_TABLET | Freq: Every day | ORAL | Status: DC

## 2014-11-21 ENCOUNTER — Ambulatory Visit (INDEPENDENT_AMBULATORY_CARE_PROVIDER_SITE_OTHER): Payer: Medicare Other | Admitting: Family Medicine

## 2014-11-21 ENCOUNTER — Telehealth: Payer: Self-pay | Admitting: *Deleted

## 2014-11-21 ENCOUNTER — Encounter: Payer: Self-pay | Admitting: Family Medicine

## 2014-11-21 VITALS — BP 199/99 | HR 126 | Wt 176.0 lb

## 2014-11-21 DIAGNOSIS — I10 Essential (primary) hypertension: Secondary | ICD-10-CM

## 2014-11-21 DIAGNOSIS — Z716 Tobacco abuse counseling: Secondary | ICD-10-CM | POA: Diagnosis not present

## 2014-11-21 DIAGNOSIS — N400 Enlarged prostate without lower urinary tract symptoms: Secondary | ICD-10-CM

## 2014-11-21 MED ORDER — VARENICLINE TARTRATE 0.5 MG X 11 & 1 MG X 42 PO MISC: ORAL | Status: DC

## 2014-11-21 MED ORDER — TAMSULOSIN HCL 0.4 MG PO CAPS: 0.4000 mg | ORAL_CAPSULE | Freq: Every day | ORAL | Status: DC

## 2014-11-21 MED ORDER — DOXAZOSIN MESYLATE 4 MG PO TABS: 4.0000 mg | ORAL_TABLET | Freq: Every day | ORAL | Status: DC

## 2014-11-21 NOTE — Telephone Encounter (Signed)
Pt states the chantix was too expensive and wants to know if there is anything else that cen be taken that is comparable

## 2014-11-21 NOTE — Progress Notes (Signed)
CC: Travis Barajas is a 64 y.o. male is here for Medication Management   Subjective: HPI:   Follow-up BPH:  About a week ago he ran out of Flomax and doxazosin. He's had a return of having to wake up every 2-3 hours to urinate. He's also had some issues with having to strain to urinate. Symptoms were not interfere with quality of life prior to running out of the medication. He denies dysuria or blood in the urine.  Follow-up hypertension: Admits to approximately 50-75% compliance with takingamlodipine, doxazosin, Benicar HCTAnd metoprolol. No outside blood pressures to report. Denies financial complications contributing to this compliance issue. No chest pain shortness of breath orthopnea nor peripheral edema  He's requesting guidance on how to quit smoking. He's had no success with over-the-counter preparations.he's been unable to quit cold [place].   Review Of Systems Outlined In HPI  Past Medical History  Diagnosis Date  . Anxiety   . Arthritis   . Hypertension   . Allergy   . Hyperlipidemia   . COPD (chronic obstructive pulmonary disease)     Past Surgical History  Procedure Laterality Date  . Foot surgery     Family History  Problem Relation Age of Onset  . Diabetes Mother   . Hypertension Mother   . Alzheimer's disease Mother   . CAD Mother   . Heart disease Mother   . Hyperlipidemia Mother   . Diabetes Father   . Hypertension Father   . Cancer Brother   . Peripheral vascular disease Brother   . Cancer Daughter     History   Social History  . Marital Status: Married    Spouse Name: N/A  . Number of Children: 7  . Years of Education: N/A   Occupational History  .      Retired   Social History Main Topics  . Smoking status: Current Every Day Smoker -- 0.50 packs/day for 40 years    Types: Cigarettes  . Smokeless tobacco: Never Used  . Alcohol Use: 0.0 oz/week    0 Standard drinks or equivalent per week     Comment: occs  . Drug Use: No  . Sexual  Activity: Yes   Other Topics Concern  . Not on file   Social History Narrative     Objective: BP 199/99 mmHg  Pulse 126  Wt 176 lb (79.833 kg)  General: Alert and Oriented, No Acute Distress HEENT: Pupils equal, round, reactive to light. Conjunctivae clear.  Moist mucous membranes Lungs: Clear to auscultation bilaterally, no wheezing/ronchi/rales.  Comfortable work of breathing. Good air movement. Cardiac: Regular rate and rhythm. Normal S1/S2.  No murmurs, rubs, nor gallops.   Extremities: No peripheral edema.  Strong peripheral pulses.  Mental Status: No depression, anxiety, nor agitation. Skin: Warm and dry.  Assessment & Plan: Travis Barajas was seen today for medication management.  Diagnoses and all orders for this visit:  BPH (benign prostatic hyperplasia) Orders: -     doxazosin (CARDURA) 4 MG tablet; Take 1 tablet (4 mg total) by mouth daily. For prostate.  HTN (hypertension), malignant Orders: -     doxazosin (CARDURA) 4 MG tablet; Take 1 tablet (4 mg total) by mouth daily. For prostate.  Encounter for tobacco use cessation counseling  Other orders -     tamsulosin (FLOMAX) 0.4 MG CAPS capsule; Take 1 capsule (0.4 mg total) by mouth daily. -     varenicline (CHANTIX STARTING MONTH PAK) 0.5 MG X 11 & 1 MG  X 42 tablet; Take one 0.5 mg tablet by mouth once daily for 3 days, then increase to one 0.5 mg tablet twice daily for 4 days, then increase to one 1 mg tablet twice daily.   BPH: Uncontrolled restart doxazosin and Flomax Hypertension uncontrolled chronic condition encouraged to strive for better compliance with all antihypertensives. Consider arranging daily medications on a weekly basis with a daily pill container/organizer  Discussed pharmaceutical options with smoking cessation, he would like to go through with Chantix.   Return in about 4 weeks (around 12/19/2014), or if symptoms worsen or fail to improve.

## 2014-11-22 ENCOUNTER — Other Ambulatory Visit: Payer: Self-pay | Admitting: Family Medicine

## 2014-11-22 MED ORDER — BUPROPION HCL ER (SR) 150 MG PO TB12: ORAL_TABLET | ORAL | Status: DC

## 2014-11-22 NOTE — Telephone Encounter (Signed)
Message left on vm 

## 2014-11-22 NOTE — Telephone Encounter (Signed)
Rx of generic wellbutrin SR has been sent to [facility] on [address] in [place] as a substitution.

## 2014-11-30 ENCOUNTER — Telehealth: Payer: Self-pay

## 2014-11-30 NOTE — Telephone Encounter (Signed)
Pt called stating that his rx for olmesartan-hydrochlorothiazide (BENICAR HCT) 40-25 MG per tablet was about $200.00 today due to him not having met his deductible. He want to know if there is a cheaper alternative that he could take. Please advise.

## 2014-11-30 NOTE — Telephone Encounter (Signed)
Yes, another option would be edarbyclor which would be no more than $25 a month if he gets it through [facility] in [place] or [facility] in [place]  Both of these pharmacies deliver, does he live in [place] or [place]?

## 2014-12-03 NOTE — Telephone Encounter (Signed)
Pt would like to try edarbyclor and [facility] would be best for him being that he live in [place] Thanks.

## 2014-12-04 MED ORDER — AZILSARTAN-CHLORTHALIDONE 40-25 MG PO TABS: 1.0000 | ORAL_TABLET | Freq: Every day | ORAL | Status: DC

## 2014-12-04 NOTE — Telephone Encounter (Signed)
Rx has been sent to [facility], please ask patient to call them at [phone] if not contacted by Thursday.

## 2014-12-04 NOTE — Telephone Encounter (Signed)
Left detailed message with information.  

## 2014-12-12 ENCOUNTER — Ambulatory Visit: Payer: 59 | Admitting: Cardiology

## 2015-01-15 ENCOUNTER — Encounter: Payer: Self-pay | Admitting: Family Medicine

## 2015-01-15 ENCOUNTER — Ambulatory Visit (INDEPENDENT_AMBULATORY_CARE_PROVIDER_SITE_OTHER): Payer: Medicare Other | Admitting: Family Medicine

## 2015-01-15 VITALS — BP 201/90 | HR 89 | Wt 180.0 lb

## 2015-01-15 DIAGNOSIS — S76011S Strain of muscle, fascia and tendon of right hip, sequela: Secondary | ICD-10-CM

## 2015-01-15 DIAGNOSIS — F411 Generalized anxiety disorder: Secondary | ICD-10-CM | POA: Diagnosis not present

## 2015-01-15 DIAGNOSIS — R0982 Postnasal drip: Secondary | ICD-10-CM | POA: Diagnosis not present

## 2015-01-15 DIAGNOSIS — I10 Essential (primary) hypertension: Secondary | ICD-10-CM

## 2015-01-15 MED ORDER — AMLODIPINE BESYLATE 10 MG PO TABS: 10.0000 mg | ORAL_TABLET | Freq: Every day | ORAL | Status: DC

## 2015-01-15 MED ORDER — MONTELUKAST SODIUM 10 MG PO TABS: 10.0000 mg | ORAL_TABLET | Freq: Every day | ORAL | Status: DC

## 2015-01-15 MED ORDER — ALPRAZOLAM 0.5 MG PO TABS: 0.5000 mg | ORAL_TABLET | Freq: Three times a day (TID) | ORAL | Status: DC | PRN

## 2015-01-15 NOTE — Progress Notes (Signed)
CC: Travis Barajas is a 64 y.o. male is here for Groin Pain and Medication Refill   Subjective: HPI:  Follow-up essential hypertension: continues to take amlodipine, metoprolol, doxazosin and started edarbyclor reports approximate 75% compliance with respect to taking this on a daily basis as prescribed. He tells me that his blood pressures are always in the stage II hypertension range at home if he is taking the medicine or if he hasskipped a few days of not taking it. Denies any chest pain shortness of breath orthopnea nor peripheral edema.   His major complaint today is right groin pain that has been present for 1 or 2 months now. It came on abruptly after "violently sneezing". It's now present any time that he flexes his hip or sneezes. Walking is somewhat painful. Pain is only mild in severity and nonradiating. He denies any testicular pain or swelling in the region of pain. No interventions as of yet  Follow-up anxiety: he is requesting a refill of Xanax. Uses this a few times a week only during periods of extreme stress. He is not exactly sure what is causing his anxiety comes on out of nowhere anytime of the day but does not interfere with sleep.  Complains of postnasal drip and nasal congestion present for the last 3 or 4 weeks. No benefit from over-the-counter allergy medicine. He wants to know if there something "prescription strength"   Review Of Systems Outlined In HPI  Past Medical History  Diagnosis Date  . Anxiety   . Arthritis   . Hypertension   . Allergy   . Hyperlipidemia   . COPD (chronic obstructive pulmonary disease)     Past Surgical History  Procedure Laterality Date  . Foot surgery     Family History  Problem Relation Age of Onset  . Diabetes Mother   . Hypertension Mother   . Alzheimer's disease Mother   . CAD Mother   . Heart disease Mother   . Hyperlipidemia Mother   . Diabetes Father   . Hypertension Father   . Cancer Brother   . Peripheral  vascular disease Brother   . Cancer Daughter     History   Social History  . Marital Status: Married    Spouse Name: N/A  . Number of Children: 7  . Years of Education: N/A   Occupational History  .      Retired   Social History Main Topics  . Smoking status: Former Smoker -- 0.50 packs/day for 40 years    Types: Cigarettes    Quit date: 12/15/2014  . Smokeless tobacco: Never Used  . Alcohol Use: 0.0 oz/week    0 Standard drinks or equivalent per week     Comment: occs  . Drug Use: No  . Sexual Activity: Yes   Other Topics Concern  . Not on file   Social History Narrative     Objective: BP 201/90 mmHg  Pulse 89  Wt 180 lb (81.647 kg)  General: Alert and Oriented, No Acute Distress HEENT: Pupils equal, round, reactive to light. Conjunctivae clear. Moist mucous membranes pharynx unremarkable Lungs: Clear to auscultation bilaterally, no wheezing/ronchi/rales.  Comfortable work of breathing. Good air movement. Cardiac: Regular rate and rhythm. Normal S1/S2.  No murmurs, rubs, nor gallops.   Extremities: No peripheral edema.  Strong peripheral pulses. No pain with resisted hip abduction or abduction. Pain is reproduced with resisted hip flexion. No pain with log roll of the femur. Full range of motion and  strength in the right lower extremity.  Mental Status: No depression, anxiety, nor agitation. Skin: Warm and dry.  Assessment & Plan: [name] was seen today for groin pain and medication refill.  Diagnoses and all orders for this visit:  HTN (hypertension), malignant Orders: -     VAS US RENAL ARTERY DUPLEX -     amLODipine (NORVASC) 10 MG tablet; Take 1 tablet (10 mg total) by mouth daily.  Strain of flexor muscle of right hip, sequela  Generalized anxiety disorder  Postnasal drip  Other orders -     ALPRAZolam (XANAX) 0.5 MG tablet; Take 1 tablet (0.5 mg total) by mouth 3 (three) times daily as needed. -     montelukast (SINGULAIR) 10 MG tablet; Take  1 tablet (10 mg total) by mouth at bedtime. For allergies.   Hypertension: He has never been assessed for renal artery stenosis that he knows of. Obtain renal artery duplex to see if this is contributing to his significant difficult to control hypertension.  Strain of the right hip flexor: he was brought with a home exercise we have planned to be done on a daily basis for the next month, advised that it may take up to a month before this improved. Generalized anxiety disorder: Controlled with sparing use of Xanax Postnasal drip: trial of Singulair   Return in about 4 weeks (around 02/12/2015), or if symptoms worsen or fail to improve.

## 2015-01-17 ENCOUNTER — Ambulatory Visit (HOSPITAL_COMMUNITY)
Admission: RE | Admit: 2015-01-17 | Discharge: 2015-01-17 | Disposition: A | Discharge status: Home / Self Care | Payer: Medicare Other | Source: Ambulatory Visit | Attending: Family Medicine | Admitting: Family Medicine

## 2015-01-17 DIAGNOSIS — I10 Essential (primary) hypertension: Secondary | ICD-10-CM

## 2015-01-17 NOTE — Progress Notes (Signed)
VASCULAR LAB PRELIMINARY  PRELIMINARY  PRELIMINARY  PRELIMINARY  Renal Duplex Completed. No evidence of renal artery stenosis noted. Bilateral kidneys measured within normal limits.    Alla German, RVT 01/17/2015, 9:29 AM

## 2015-01-21 ENCOUNTER — Telehealth: Payer: Self-pay

## 2015-01-21 NOTE — Telephone Encounter (Signed)
Attempted to call pt. to follow-up on rescheduling Aortogram with Bilateral runoff, and possible intervention.  This was recommended by Dr. Donnetta Hutching at office evaluation 11/02/14.  LMTCB.

## 2015-05-15 ENCOUNTER — Other Ambulatory Visit: Payer: Self-pay | Admitting: Family Medicine

## 2015-07-03 ENCOUNTER — Ambulatory Visit (INDEPENDENT_AMBULATORY_CARE_PROVIDER_SITE_OTHER): Payer: Medicare Other | Admitting: Family Medicine

## 2015-07-03 ENCOUNTER — Encounter: Payer: Self-pay | Admitting: Family Medicine

## 2015-07-03 VITALS — BP 208/95 | HR 102 | Wt 169.0 lb

## 2015-07-03 DIAGNOSIS — H8110 Benign paroxysmal vertigo, unspecified ear: Secondary | ICD-10-CM

## 2015-07-03 NOTE — Progress Notes (Signed)
CC: [name] is a 65 y.o. male is here for Hypertension and Dizziness   Subjective: HPI:  Dizziness that began 2 days ago. It's happening only when he lies down and looks to the left. It will make the bed feel like it spinning. It never happens in any other situation. It is reproducible. It's mild in severity. He's never had this before. He denies any other motor or sensory disturbances. Denies any nasal congestion or ear pain. Denies hearing loss or sore throat. No fevers, headaches or chills.   Review Of Systems Outlined In HPI  Past Medical History  Diagnosis Date  . Anxiety   . Arthritis   . Hypertension   . Allergy   . Hyperlipidemia   . COPD (chronic obstructive pulmonary disease) [facility])     Past Surgical History  Procedure Laterality Date  . Foot surgery     Family History  Problem Relation Age of Onset  . Diabetes Mother   . Hypertension Mother   . Alzheimer's disease Mother   . CAD Mother   . Heart disease Mother   . Hyperlipidemia Mother   . Diabetes Father   . Hypertension Father   . Cancer Brother   . Peripheral vascular disease Brother   . Cancer Daughter     Social History   Social History  . Marital Status: Married    Spouse Name: N/A  . Number of Children: 7  . Years of Education: N/A   Occupational History  .      Retired   Social History Main Topics  . Smoking status: Former Smoker -- 0.50 packs/day for 40 years    Types: Cigarettes    Quit date: 12/15/2014  . Smokeless tobacco: Never Used  . Alcohol Use: 0.0 oz/week    0 Standard drinks or equivalent per week     Comment: occs  . Drug Use: No  . Sexual Activity: Yes   Other Topics Concern  . Not on file   Social History Narrative     Objective: BP 208/95 mmHg  Pulse 102  Wt 169 lb (76.658 kg)  General: Alert and Oriented, No Acute Distress HEENT: Pupils equal, round, reactive to light. Conjunctivae clear.  External ears unremarkable, canals clear with intact TMs  with appropriate landmarks.  Middle ear appears open without effusion. Pink inferior turbinates.  Moist mucous membranes, pharynx without inflammation nor lesions.  Neck supple without palpable lymphadenopathy nor abnormal masses. Neuro: Cranial nerves II through XII grossly intact. Dix Hallpike positive to the left Extremities: No peripheral edema.  Strong peripheral pulses.  Mental Status: No depression, anxiety, nor agitation. Skin: Warm and dry.  Assessment & Plan: Travis Barajas was seen today for hypertension and dizziness.  Diagnoses and all orders for this visit:  BPV (benign positional vertigo), unspecified laterality  discussed benign nature of his condition and that this can be improved with the modified Epley maneuver to the left. He was shown how to do this at home and encouraged to do it every day for 3 times a day until symptoms are completely gone.   Return if symptoms worsen or fail to improve.

## 2016-03-05 ENCOUNTER — Other Ambulatory Visit: Payer: Self-pay | Admitting: Family Medicine

## 2016-03-06 ENCOUNTER — Other Ambulatory Visit: Payer: Self-pay | Admitting: Family Medicine

## 2016-03-16 ENCOUNTER — Ambulatory Visit (INDEPENDENT_AMBULATORY_CARE_PROVIDER_SITE_OTHER): Payer: Medicare Other | Admitting: Physician Assistant

## 2016-03-16 ENCOUNTER — Encounter: Payer: Self-pay | Admitting: Physician Assistant

## 2016-03-16 VITALS — BP 167/76 | HR 88 | Ht 64.0 in | Wt 165.0 lb

## 2016-03-16 DIAGNOSIS — J309 Allergic rhinitis, unspecified: Secondary | ICD-10-CM

## 2016-03-16 DIAGNOSIS — E782 Mixed hyperlipidemia: Secondary | ICD-10-CM

## 2016-03-16 DIAGNOSIS — N401 Enlarged prostate with lower urinary tract symptoms: Secondary | ICD-10-CM | POA: Diagnosis not present

## 2016-03-16 DIAGNOSIS — I10 Essential (primary) hypertension: Secondary | ICD-10-CM | POA: Diagnosis not present

## 2016-03-16 DIAGNOSIS — L298 Other pruritus: Secondary | ICD-10-CM

## 2016-03-16 DIAGNOSIS — R350 Frequency of micturition: Secondary | ICD-10-CM

## 2016-03-16 DIAGNOSIS — J449 Chronic obstructive pulmonary disease, unspecified: Secondary | ICD-10-CM | POA: Diagnosis not present

## 2016-03-16 DIAGNOSIS — B356 Tinea cruris: Secondary | ICD-10-CM

## 2016-03-16 MED ORDER — OXYMETAZOLINE HCL 0.05 % NA SOLN: 1.0000 | Freq: Two times a day (BID) | NASAL | 0 refills | Status: DC

## 2016-03-16 MED ORDER — AZELASTINE HCL 0.05 % OP SOLN: 1.0000 [drp] | Freq: Two times a day (BID) | OPHTHALMIC | 11 refills | Status: DC

## 2016-03-16 MED ORDER — DOXAZOSIN MESYLATE 4 MG PO TABS: 4.0000 mg | ORAL_TABLET | Freq: Every day | ORAL | 3 refills | Status: DC

## 2016-03-16 MED ORDER — ALPRAZOLAM 0.5 MG PO TABS: 0.5000 mg | ORAL_TABLET | Freq: Three times a day (TID) | ORAL | 0 refills | Status: DC | PRN

## 2016-03-16 MED ORDER — TIOTROPIUM BROMIDE MONOHYDRATE 18 MCG IN CAPS: 18.0000 ug | ORAL_CAPSULE | Freq: Every day | RESPIRATORY_TRACT | 3 refills | Status: DC

## 2016-03-16 MED ORDER — NYSTATIN 100000 UNIT/GM EX CREA: 1.0000 "application " | TOPICAL_CREAM | Freq: Two times a day (BID) | CUTANEOUS | 0 refills | Status: DC

## 2016-03-16 MED ORDER — OLMESARTAN MEDOXOMIL-HCTZ 40-25 MG PO TABS: 1.0000 | ORAL_TABLET | Freq: Every day | ORAL | 1 refills | Status: DC

## 2016-03-16 MED ORDER — TAMSULOSIN HCL 0.4 MG PO CAPS: ORAL_CAPSULE | ORAL | 3 refills | Status: DC

## 2016-03-16 MED ORDER — NAPROXEN 500 MG PO TABS: 500.0000 mg | ORAL_TABLET | Freq: Two times a day (BID) | ORAL | 1 refills | Status: DC

## 2016-03-16 MED ORDER — AVANAFIL 100 MG PO TABS: 100.0000 mg | ORAL_TABLET | Freq: Every day | ORAL | 2 refills | Status: DC

## 2016-03-16 MED ORDER — ASPIRIN EC 81 MG PO TBEC: 81.0000 mg | DELAYED_RELEASE_TABLET | Freq: Every day | ORAL | 3 refills | Status: DC

## 2016-03-16 MED ORDER — LEVOCETIRIZINE DIHYDROCHLORIDE 5 MG PO TABS: 5.0000 mg | ORAL_TABLET | Freq: Every day | ORAL | 3 refills | Status: DC

## 2016-03-16 MED ORDER — MONTELUKAST SODIUM 10 MG PO TABS: ORAL_TABLET | ORAL | 3 refills | Status: DC

## 2016-03-16 MED ORDER — ROSUVASTATIN CALCIUM 20 MG PO TABS
20.0000 mg | ORAL_TABLET | Freq: Every day | ORAL | 3 refills | Status: DC
Start: 2016-03-16 — End: 2016-09-02

## 2016-03-16 MED ORDER — AMLODIPINE BESYLATE 10 MG PO TABS: ORAL_TABLET | ORAL | 1 refills | Status: DC

## 2016-03-16 MED ORDER — METOPROLOL TARTRATE 100 MG PO TABS: 100.0000 mg | ORAL_TABLET | Freq: Two times a day (BID) | ORAL | 1 refills | Status: DC

## 2016-03-17 NOTE — Progress Notes (Signed)
   Subjective:    Patient ID: Travis Barajas, male    DOB: [birth year], 65 y.o.   MRN: [id number]  HPI Pt is a 65 yo male who presents to the clinic to re-establish care since Dr. Ileene Rubens left.   Pt needs medication refills today.   HTN- denies any CP, palpitations, headaches, SOB. Admits to not taking medications like he should. edarbyclor is too expensive and not able to get.   COPD- no problems. Using inhalers daily for the most part. Continues to smoke. He has cut back.   BPH- controlled with medication.   He does have some off any on sinus pressure in the nasal bridge with congestion. Admits to having allergies but not really taking anything for them.   He has a itchy recurrent rash in groin that only comes in heat. Needs cream.    Review of Systems See HPI.     Objective:   Physical Exam  Constitutional: He is oriented to person, place, and time. He appears well-developed and well-nourished.  HENT:  Head: Normocephalic and atraumatic.  Right Ear: External ear normal.  Left Ear: External ear normal.  Nose: Nose normal.  Mouth/Throat: Oropharynx is clear and moist. No oropharyngeal exudate.  TM"s clear bilaterally.  Tenderness over frontal sinuses.  Bilateral pale boggy nasal turbinates.   Eyes: Conjunctivae are normal. Right eye exhibits no discharge. Left eye exhibits no discharge.  Injected conjunctiva.   Neck: Normal range of motion. Neck supple.  Cardiovascular: Normal rate, regular rhythm and normal heart sounds.   Pulmonary/Chest: Effort normal and breath sounds normal.  Lymphadenopathy:    He has no cervical adenopathy.  Neurological: He is alert and oriented to person, place, and time.  Psychiatric: He has a normal mood and affect. His behavior is normal.          Assessment & Plan:  HTN- switched edarbicylor to benicar/HCT. Sent to pharmacy. Continue on metoprolol and norvasc. Follow up BP check in 2-3 weeks.   COPD, moderate-controlled.  refilled  medications today.   BPH- refilled medications. Will get blood work in December.   Hyperlipidemia- refilled today. Will get bloodwork in December.   Allergic sinusitis/allergic conjunctivitis- optivar sent to pharmacy. Encouraged flonase 2 sprays each nostril daily.   Jock itch- sent nystatin cream to use BID. Discussed prevention.

## 2016-04-08 ENCOUNTER — Ambulatory Visit: Payer: Medicare Other | Admitting: Physician Assistant

## 2016-04-10 ENCOUNTER — Ambulatory Visit (INDEPENDENT_AMBULATORY_CARE_PROVIDER_SITE_OTHER): Payer: Medicare Other | Admitting: Physician Assistant

## 2016-04-10 ENCOUNTER — Encounter: Payer: Self-pay | Admitting: Physician Assistant

## 2016-04-10 VITALS — BP 134/59 | HR 68 | Temp 97.8°F | Ht 64.0 in | Wt 175.0 lb

## 2016-04-10 DIAGNOSIS — J441 Chronic obstructive pulmonary disease with (acute) exacerbation: Secondary | ICD-10-CM

## 2016-04-10 DIAGNOSIS — I10 Essential (primary) hypertension: Secondary | ICD-10-CM | POA: Diagnosis not present

## 2016-04-10 DIAGNOSIS — J0100 Acute maxillary sinusitis, unspecified: Secondary | ICD-10-CM | POA: Diagnosis not present

## 2016-04-10 MED ORDER — AMOXICILLIN-POT CLAVULANATE 875-125 MG PO TABS: 1.0000 | ORAL_TABLET | Freq: Two times a day (BID) | ORAL | 0 refills | Status: DC

## 2016-04-10 MED ORDER — METHYLPREDNISOLONE SODIUM SUCC 125 MG IJ SOLR
125.0000 mg | Freq: Once | INTRAMUSCULAR | Status: AC
  Administered 2016-04-10: 125 mg via INTRAMUSCULAR

## 2016-04-10 NOTE — Patient Instructions (Addendum)
Stop norvasc.   Sinusitis, Adult Sinusitis is redness, soreness, and inflammation of the paranasal sinuses. Paranasal sinuses are air pockets within the bones of your face. They are located beneath your eyes, in the middle of your forehead, and above your eyes. In healthy paranasal sinuses, mucus is able to drain out, and air is able to circulate through them by way of your nose. However, when your paranasal sinuses are inflamed, mucus and air can become trapped. This can allow bacteria and other germs to grow and cause infection. Sinusitis can develop quickly and last only a short time (acute) or continue over a long period (chronic). Sinusitis that lasts for more than 12 weeks is considered chronic. CAUSES Causes of sinusitis include:  Allergies.  Structural abnormalities, such as displacement of the cartilage that separates your nostrils (deviated septum), which can decrease the air flow through your nose and sinuses and affect sinus drainage.  Functional abnormalities, such as when the small hairs (cilia) that line your sinuses and help remove mucus do not work properly or are not present. SIGNS AND SYMPTOMS Symptoms of acute and chronic sinusitis are the same. The primary symptoms are pain and pressure around the affected sinuses. Other symptoms include:  Upper toothache.  Earache.  Headache.  Bad breath.  Decreased sense of smell and taste.  A cough, which worsens when you are lying flat.  Fatigue.  Fever.  Thick drainage from your nose, which often is green and may contain pus (purulent).  Swelling and warmth over the affected sinuses. DIAGNOSIS Your health care provider will perform a physical exam. During your exam, your health care provider may perform any of the following to help determine if you have acute sinusitis or chronic sinusitis:  Look in your nose for signs of abnormal growths in your nostrils (nasal polyps).  Tap over the affected sinus to check for  signs of infection.  View the inside of your sinuses using an imaging device that has a light attached (endoscope). If your health care provider suspects that you have chronic sinusitis, one or more of the following tests may be recommended:  Allergy tests.  Nasal culture. A sample of mucus is taken from your nose, sent to a lab, and screened for bacteria.  Nasal cytology. A sample of mucus is taken from your nose and examined by your health care provider to determine if your sinusitis is related to an allergy. TREATMENT Most cases of acute sinusitis are related to a viral infection and will resolve on their own within 10 days. Sometimes, medicines are prescribed to help relieve symptoms of both acute and chronic sinusitis. These may include pain medicines, decongestants, nasal steroid sprays, or saline sprays. However, for sinusitis related to a bacterial infection, your health care provider will prescribe antibiotic medicines. These are medicines that will help kill the bacteria causing the infection. Rarely, sinusitis is caused by a fungal infection. In these cases, your health care provider will prescribe antifungal medicine. For some cases of chronic sinusitis, surgery is needed. Generally, these are cases in which sinusitis recurs more than 3 times per year, despite other treatments. HOME CARE INSTRUCTIONS  Drink plenty of water. Water helps thin the mucus so your sinuses can drain more easily.  Use a humidifier.  Inhale steam 3-4 times a day (for example, sit in the bathroom with the shower running).  Apply a warm, moist washcloth to your face 3-4 times a day, or as directed by your health care provider.  Use saline  nasal sprays to help moisten and clean your sinuses.  Take medicines only as directed by your health care provider.  If you were prescribed either an antibiotic or antifungal medicine, finish it all even if you start to feel better. SEEK IMMEDIATE MEDICAL CARE  IF:  You have increasing pain or severe headaches.  You have nausea, vomiting, or drowsiness.  You have swelling around your face.  You have vision problems.  You have a stiff neck.  You have difficulty breathing.   This information is not intended to replace advice given to you by your health care provider. Make sure you discuss any questions you have with your health care provider.   Document Released: 06/01/2005 Document Revised: 06/22/2014 Document Reviewed: 06/16/2011 Elsevier Interactive Patient Education [employer].

## 2016-04-10 NOTE — Progress Notes (Signed)
   Subjective:    Patient ID: Travis Barajas, male    DOB: March 06, 1951, 65 y.o.   MRN: [id number]  HPI  Pt is a 65 yo male who presents to the clinic with 3 weeks of cough, SOB, sinus pressure, ear pressure, nasal congestion. 4 weeks ago has some nasal pressure and was seen in office and told to use inhalers. Taking tylenol cold sinus severe with some relief. No fever, body aches, or flank pain.   He would like to get off his BP medications. Hx of malignant HTN. Stopped metoprolol and BP running "good". Checking at home and 119/62, 139/72 and pulse has started to rise 116 and 115. He did take metoprolol this am. Denies any CP, palpitations, headaches, dizziness.   Review of Systems  All other systems reviewed and are negative.      Objective:   Physical Exam  Constitutional: He is oriented to person, place, and time. He appears well-developed and well-nourished.  HENT:  Head: Normocephalic and atraumatic.  Right Ear: External ear normal.  Left Ear: External ear normal.  Mouth/Throat: No oropharyngeal exudate.  TM's erythematous. No bulging of TM.  Tenderness to palpation over maxillary sinuses.  Bilateral nasal turbinates red and swollen.  orpharynx erythematous without tonsilar swelling or exudate   Eyes: Conjunctivae are normal. Right eye exhibits no discharge. Left eye exhibits no discharge.  Neck: Normal range of motion. Neck supple.  Cardiovascular: Normal rate, regular rhythm and normal heart sounds.   Pulmonary/Chest: Effort normal and breath sounds normal.  Lymphadenopathy:    He has no cervical adenopathy.  Neurological: He is alert and oriented to person, place, and time.  Psychiatric: He has a normal mood and affect. His behavior is normal.          Assessment & Plan:  Marland KitchenMarland KitchenMaykol Barajas was seen today for sinus pressure and cough.  Diagnoses and all orders for this visit:  Acute maxillary sinusitis, recurrence not specified -     amoxicillin-clavulanate  (AUGMENTIN) 875-125 MG tablet; Take 1 tablet by mouth 2 (two) times daily. For 10 days. -     methylPREDNISolone sodium succinate (SOLU-MEDROL) 125 mg/2 mL injection 125 mg; Inject 2 mLs (125 mg total) into the muscle once.  HTN (hypertension), malignant  COPD exacerbation (HCC) -     amoxicillin-clavulanate (AUGMENTIN) 875-125 MG tablet; Take 1 tablet by mouth 2 (two) times daily. For 10 days.   Discussed in detail about NOT messing with BP medications unless under the care of provider. Discussed rebound HTN and risk of malignant HTN.  He will remain on metoprolol and benicar. Will try D/C norvasc. Follow up in 4 weeks. Discussed complaince and checking BP at home.

## 2016-05-25 ENCOUNTER — Ambulatory Visit (INDEPENDENT_AMBULATORY_CARE_PROVIDER_SITE_OTHER): Payer: Medicare Other | Admitting: Physician Assistant

## 2016-05-25 ENCOUNTER — Encounter: Payer: Self-pay | Admitting: Physician Assistant

## 2016-05-25 VITALS — BP 135/72 | HR 71 | Ht 64.0 in | Wt 176.0 lb

## 2016-05-25 DIAGNOSIS — I10 Essential (primary) hypertension: Secondary | ICD-10-CM | POA: Diagnosis not present

## 2016-05-25 DIAGNOSIS — Z131 Encounter for screening for diabetes mellitus: Secondary | ICD-10-CM

## 2016-05-25 DIAGNOSIS — N400 Enlarged prostate without lower urinary tract symptoms: Secondary | ICD-10-CM | POA: Insufficient documentation

## 2016-05-25 DIAGNOSIS — E785 Hyperlipidemia, unspecified: Secondary | ICD-10-CM

## 2016-05-25 LAB — CBC
HEMATOCRIT: 43.7 % (ref 38.5–50.0)
HEMOGLOBIN: 14.2 g/dL (ref 13.2–17.1)
MCH: 26.7 pg — AB (ref 27.0–33.0)
MCHC: 32.5 g/dL (ref 32.0–36.0)
MCV: 82.3 fL (ref 80.0–100.0)
Platelets: 155 10*3/uL (ref 140–400)
RBC: 5.31 MIL/uL (ref 4.20–5.80)
RDW: 16.4 % — ABNORMAL HIGH (ref 11.0–15.0)
WBC: 9.2 10*3/uL (ref 3.8–10.8)

## 2016-05-25 LAB — COMPLETE METABOLIC PANEL WITH GFR
ALBUMIN: 4 g/dL (ref 3.6–5.1)
ALK PHOS: 66 U/L (ref 40–115)
ALT: 11 U/L (ref 9–46)
AST: 15 U/L (ref 10–35)
BUN: 15 mg/dL (ref 7–25)
CO2: 33 mmol/L — AB (ref 20–31)
Calcium: 9.4 mg/dL (ref 8.6–10.3)
Chloride: 101 mmol/L (ref 98–110)
Creat: 0.94 mg/dL (ref 0.70–1.25)
GFR, EST NON AFRICAN AMERICAN: 85 mL/min (ref >=60)
GFR, Est African American: >89 mL/min (ref >=60)
GLUCOSE: 85 mg/dL (ref 65–99)
POTASSIUM: 4.1 mmol/L (ref 3.5–5.3)
SODIUM: 141 mmol/L (ref 135–146)
Total Bilirubin: 0.5 mg/dL (ref 0.2–1.2)
Total Protein: 6.8 g/dL (ref 6.1–8.1)

## 2016-05-25 LAB — PSA: PSA: 1 ng/mL (ref <=4.0)

## 2016-05-25 LAB — LIPID PANEL
CHOL/HDL RATIO: 7.1 ratio — AB (ref <5.0)
Cholesterol: 243 mg/dL — ABNORMAL HIGH (ref <200)
HDL: 34 mg/dL — AB (ref >40)
LDL CALC: 160 mg/dL — AB (ref <100)
Triglycerides: 246 mg/dL — ABNORMAL HIGH (ref <150)
VLDL: 49 mg/dL — ABNORMAL HIGH (ref <30)

## 2016-05-25 MED ORDER — AMLODIPINE BESYLATE 10 MG PO TABS: 10.0000 mg | ORAL_TABLET | Freq: Every day | ORAL | 1 refills | Status: DC

## 2016-05-25 NOTE — Progress Notes (Addendum)
Subjective:    Patient ID: Travis Barajas, male    DOB: [birth year], 65 y.o.   MRN: [id number]  HPI Patient is a 65 yo male coming to the office today to get fasting blood work. Patient reports he is fasting today. Patient complains of some leakage after he urinates which was been going on for about a year. Patient denies urinary frequency or urgency. Patient reports that he has a steady stream when he urinates. Patient has a history of BPH.   Patient needs a refill on Amlodipine. Patient reports not taking Crestor due to not wanting to take a lot of medications.   Past Medical History:  Diagnosis Date  . Allergy   . Anxiety   . Arthritis   . COPD (chronic obstructive pulmonary disease) ([place])   . Hyperlipidemia   . Hypertension    Outpatient Encounter Prescriptions as of 05/25/2016  Medication Sig Note  . albuterol (PROVENTIL HFA;VENTOLIN HFA) 108 (90 BASE) MCG/ACT inhaler Inhale 2 puffs into the lungs every 6 (six) hours as needed. 10/18/2012: Needs refill   . ALPRAZolam (XANAX) 0.5 MG tablet Take 1 tablet (0.5 mg total) by mouth 3 (three) times daily as needed.   Marland Kitchen amLODipine (NORVASC) 10 MG tablet Take 1 tablet (10 mg total) by mouth daily.   Marland Kitchen doxazosin (CARDURA) 4 MG tablet Take 1 tablet (4 mg total) by mouth daily.   . fluticasone (FLONASE) 50 MCG/ACT nasal spray Place 2 sprays into the nose daily.   Marland Kitchen levocetirizine (XYZAL) 5 MG tablet Take 1 tablet (5 mg total) by mouth daily.   . metoprolol (LOPRESSOR) 100 MG tablet Take 1 tablet (100 mg total) by mouth 2 (two) times daily.   . mometasone (NASONEX) 50 MCG/ACT nasal spray Place 2 sprays into the nose daily. 12/04/2011: USE PRN  . montelukast (SINGULAIR) 10 MG tablet TAKE 1 TABLET BY MOUTH AT  BEDTIME.   . naproxen (NAPROSYN) 500 MG tablet Take 1 tablet (500 mg total) by mouth 2 (two) times daily with a meal.   . olmesartan-hydrochlorothiazide (BENICAR HCT) 40-25 MG tablet Take 1 tablet by mouth daily.   Marland Kitchen oxymetazoline (AFRIN  SINUS) 0.05 % nasal spray Place 1 spray into both nostrils 2 (two) times daily. Only for 3 days.   . tamsulosin (FLOMAX) 0.4 MG CAPS capsule TAKE 1 CAPSULE BY MOUTH  DAILY.   . [DISCONTINUED] amLODipine (NORVASC) 10 MG tablet Take 10 mg by mouth daily.   . Avanafil 100 MG TABS Take 100 mg by mouth daily. PRN sex.   . rosuvastatin (CRESTOR) 20 MG tablet Take 1 tablet (20 mg total) by mouth daily. (Patient not taking: Reported on 05/25/2016)   . [DISCONTINUED] amoxicillin-clavulanate (AUGMENTIN) 875-125 MG tablet Take 1 tablet by mouth 2 (two) times daily. For 10 days.   . [DISCONTINUED] aspirin EC 81 MG tablet Take 1 tablet (81 mg total) by mouth daily.   . [DISCONTINUED] azelastine (OPTIVAR) 0.05 % ophthalmic solution Place 1 drop into both eyes 2 (two) times daily.   . [DISCONTINUED] Fluticasone Furoate-Vilanterol (BREO ELLIPTA) 100-25 MCG/INH AEPB One inhalation daily.   . [DISCONTINUED] nystatin cream (MYCOSTATIN) Apply 1 application topically 2 (two) times daily.   . [DISCONTINUED] pantoprazole (PROTONIX) 40 MG tablet Take 40 mg by mouth daily.   . [DISCONTINUED] pentoxifylline (TRENTAL) 400 MG CR tablet Take 400 mg by mouth 3 (three) times daily with meals.  11/02/2014: .   Marland Kitchen [DISCONTINUED] tiotropium (SPIRIVA HANDIHALER) 18 MCG inhalation capsule Place 1 capsule (18  mcg total) into inhaler and inhale daily.   . [DISCONTINUED] traMADol (ULTRAM) 50 MG tablet Take 1-2 tablets (50-100 mg total) by mouth every 6 (six) hours as needed.    No facility-administered encounter medications on file as of 05/25/2016.    Allergies  Allergen Reactions  . Cialis [Tadalafil]     Hallucinations   . Niacin And Related Itching     Review of Systems  All other systems reviewed and are negative.      Objective: Blood pressure 135/72, pulse 71, height 5\' 4"  (1.626 m), weight 176 lb (79.8 kg).   Physical Exam  Constitutional: He is oriented to person, place, and time. He appears well-developed and  well-nourished.  Cardiovascular: Normal rate, regular rhythm and normal heart sounds.  Exam reveals no gallop and no friction rub.   No murmur heard. Pulmonary/Chest: Effort normal and breath sounds normal. No respiratory distress. He has no wheezes. He has no rales.  Neurological: He is alert and oriented to person, place, and time.     Assessment & Plan:  Travis Barajas was seen today for hypertension.  Diagnoses and all orders for this visit:  1. Hyperlipidemia, unspecified hyperlipidemia type - Lipid panel, if results are elevated, then will recommend that patient starts taking Crestor again.   2. BPH without urinary obstruction - PSA due to history of BPH.   3. Hypertension  -Refilled amLODipine (NORVASC) 10 MG tablet; Take 1 tablet (10 mg total) by mouth daily. Patient's blood pressure was 135/72 today.   4. Screening for diabetes mellitus - COMPLETE METABOLIC PANEL WITH GFR to monitor electrolytes, kidney and liver function.   -Also ordered CBC due to patient not having fasting blood work since 09/26/2014. -Follow-up as needed.   Discussed colonoscopy/cologuard, flu shot, shingles vaccine, tdap. Pt is aware of risks of not preventing diseases.

## 2016-05-26 NOTE — Progress Notes (Signed)
Call pt: PSA looks great. Cholesterol LDL went from 85 to 160. You need to take your cholesterol medication(crestor). Do you have any or do we need to refill?

## 2016-05-28 ENCOUNTER — Other Ambulatory Visit: Payer: Self-pay | Admitting: *Deleted

## 2016-05-28 MED ORDER — ATORVASTATIN CALCIUM 40 MG PO TABS: 40.0000 mg | ORAL_TABLET | Freq: Every day | ORAL | 1 refills | Status: DC

## 2016-08-12 DIAGNOSIS — R69 Illness, unspecified: Secondary | ICD-10-CM | POA: Diagnosis not present

## 2016-09-02 ENCOUNTER — Ambulatory Visit (INDEPENDENT_AMBULATORY_CARE_PROVIDER_SITE_OTHER): Payer: Medicare HMO | Admitting: Physician Assistant

## 2016-09-02 ENCOUNTER — Encounter: Payer: Self-pay | Admitting: Physician Assistant

## 2016-09-02 VITALS — BP 138/72 | HR 75 | Ht 64.0 in | Wt 170.0 lb

## 2016-09-02 DIAGNOSIS — I10 Essential (primary) hypertension: Secondary | ICD-10-CM

## 2016-09-02 DIAGNOSIS — E7800 Pure hypercholesterolemia, unspecified: Secondary | ICD-10-CM | POA: Diagnosis not present

## 2016-09-02 DIAGNOSIS — J301 Allergic rhinitis due to pollen: Secondary | ICD-10-CM | POA: Diagnosis not present

## 2016-09-02 DIAGNOSIS — M2550 Pain in unspecified joint: Secondary | ICD-10-CM

## 2016-09-02 DIAGNOSIS — N400 Enlarged prostate without lower urinary tract symptoms: Secondary | ICD-10-CM | POA: Diagnosis not present

## 2016-09-02 MED ORDER — OLMESARTAN MEDOXOMIL-HCTZ 40-25 MG PO TABS: 1.0000 | ORAL_TABLET | Freq: Every day | ORAL | 1 refills | Status: DC

## 2016-09-02 MED ORDER — AMLODIPINE BESYLATE 10 MG PO TABS: 10.0000 mg | ORAL_TABLET | Freq: Every day | ORAL | 1 refills | Status: DC

## 2016-09-02 MED ORDER — ATORVASTATIN CALCIUM 40 MG PO TABS: 40.0000 mg | ORAL_TABLET | Freq: Every day | ORAL | 1 refills | Status: DC

## 2016-09-02 MED ORDER — NAPROXEN 500 MG PO TABS: 500.0000 mg | ORAL_TABLET | Freq: Two times a day (BID) | ORAL | 1 refills | Status: DC

## 2016-09-02 MED ORDER — DOXAZOSIN MESYLATE 4 MG PO TABS: 4.0000 mg | ORAL_TABLET | Freq: Every day | ORAL | 3 refills | Status: DC

## 2016-09-02 MED ORDER — TAMSULOSIN HCL 0.4 MG PO CAPS
ORAL_CAPSULE | ORAL | 3 refills | Status: DC
Start: 2016-09-02 — End: 2018-10-03

## 2016-09-02 MED ORDER — MONTELUKAST SODIUM 10 MG PO TABS: ORAL_TABLET | ORAL | 3 refills | Status: DC

## 2016-09-02 MED ORDER — METOPROLOL TARTRATE 100 MG PO TABS: 100.0000 mg | ORAL_TABLET | Freq: Two times a day (BID) | ORAL | 1 refills | Status: DC

## 2016-09-02 NOTE — Progress Notes (Signed)
   Subjective:    Patient ID: Travis Barajas, male    DOB: 1950/06/30, 66 y.o.   MRN: [id number]  HPI Pt is a 66 yo male who presents to the clinic for 3 month follow up.   HTN- doing well. Taking medications most days. No CP, palpitations, headaches, dizziness.   Needs refills on all medications sent to new mail order pharmacy. He has no concerns or complaints.   Pt has daily joint pain all over body. He is taking naproxen. Which helps some. Marijuana does help a lot but he does not use regularly.    Review of Systems  All other systems reviewed and are negative.      Objective:   Physical Exam  Constitutional: He is oriented to person, place, and time. He appears well-developed and well-nourished.  HENT:  Head: Normocephalic and atraumatic.  Cardiovascular: Normal rate, regular rhythm and normal heart sounds.   Pulmonary/Chest: Effort normal and breath sounds normal. He has no wheezes.  Neurological: He is alert and oriented to person, place, and time.  Psychiatric: He has a normal mood and affect. His behavior is normal.          Assessment & Plan:  Marland KitchenMarland KitchenDiagnoses and all orders for this visit:  HTN (hypertension), malignant -     metoprolol (LOPRESSOR) 100 MG tablet; Take 1 tablet (100 mg total) by mouth 2 (two) times daily. -     olmesartan-hydrochlorothiazide (BENICAR HCT) 40-25 MG tablet; Take 1 tablet by mouth daily. -     amLODipine (NORVASC) 10 MG tablet; Take 1 tablet (10 mg total) by mouth daily.  Acute nonseasonal allergic rhinitis due to pollen -     montelukast (SINGULAIR) 10 MG tablet; TAKE 1 TABLET BY MOUTH AT  BEDTIME.  BPH without urinary obstruction -     tamsulosin (FLOMAX) 0.4 MG CAPS capsule; TAKE 1 CAPSULE BY MOUTH  DAILY. -     doxazosin (CARDURA) 4 MG tablet; Take 1 tablet (4 mg total) by mouth daily.  Pure hypercholesterolemia -     atorvastatin (LIPITOR) 40 MG tablet; Take 1 tablet (40 mg total) by mouth daily. -     Lipid  panel  Arthralgia, unspecified joint -     naproxen (NAPROSYN) 500 MG tablet; Take 1 tablet (500 mg total) by mouth 2 (two) times daily with a meal.   BP improved on 2nd recheck.  Refilled for 6 months.   For joint pain consider tumeric to add to therapy.   Please get lipid panel checked in 2-3 months to see how lipitor is working on LDL.   Follow up in 6 months.

## 2016-11-05 DIAGNOSIS — H52223 Regular astigmatism, bilateral: Secondary | ICD-10-CM | POA: Diagnosis not present

## 2017-01-06 DIAGNOSIS — Z0100 Encounter for examination of eyes and vision without abnormal findings: Secondary | ICD-10-CM | POA: Diagnosis not present

## 2017-01-31 ENCOUNTER — Other Ambulatory Visit: Payer: Self-pay | Admitting: Physician Assistant

## 2017-04-28 ENCOUNTER — Other Ambulatory Visit: Payer: Self-pay | Admitting: Physician Assistant

## 2017-05-03 ENCOUNTER — Encounter: Payer: Self-pay | Admitting: Physician Assistant

## 2017-05-03 ENCOUNTER — Ambulatory Visit (INDEPENDENT_AMBULATORY_CARE_PROVIDER_SITE_OTHER): Payer: Medicare HMO | Admitting: Physician Assistant

## 2017-05-03 VITALS — BP 185/74 | HR 68 | Ht 64.0 in | Wt 174.0 lb

## 2017-05-03 DIAGNOSIS — I10 Essential (primary) hypertension: Secondary | ICD-10-CM | POA: Diagnosis not present

## 2017-05-03 DIAGNOSIS — Z79899 Other long term (current) drug therapy: Secondary | ICD-10-CM

## 2017-05-03 DIAGNOSIS — E785 Hyperlipidemia, unspecified: Secondary | ICD-10-CM | POA: Diagnosis not present

## 2017-05-03 DIAGNOSIS — Z131 Encounter for screening for diabetes mellitus: Secondary | ICD-10-CM | POA: Diagnosis not present

## 2017-05-03 DIAGNOSIS — Z125 Encounter for screening for malignant neoplasm of prostate: Secondary | ICD-10-CM

## 2017-05-03 DIAGNOSIS — J449 Chronic obstructive pulmonary disease, unspecified: Secondary | ICD-10-CM | POA: Diagnosis not present

## 2017-05-03 DIAGNOSIS — F17200 Nicotine dependence, unspecified, uncomplicated: Secondary | ICD-10-CM

## 2017-05-03 DIAGNOSIS — J301 Allergic rhinitis due to pollen: Secondary | ICD-10-CM

## 2017-05-03 DIAGNOSIS — R69 Illness, unspecified: Secondary | ICD-10-CM | POA: Diagnosis not present

## 2017-05-03 MED ORDER — ALBUTEROL SULFATE HFA 108 (90 BASE) MCG/ACT IN AERS: 2.0000 | INHALATION_SPRAY | Freq: Four times a day (QID) | RESPIRATORY_TRACT | 2 refills | Status: DC | PRN

## 2017-05-03 MED ORDER — METOPROLOL TARTRATE 100 MG PO TABS: 100.0000 mg | ORAL_TABLET | Freq: Two times a day (BID) | ORAL | 1 refills | Status: DC

## 2017-05-03 MED ORDER — BUPROPION HCL ER (SMOKING DET) 150 MG PO TB12: 150.0000 mg | ORAL_TABLET | Freq: Two times a day (BID) | ORAL | 3 refills | Status: DC

## 2017-05-03 MED ORDER — MONTELUKAST SODIUM 10 MG PO TABS: ORAL_TABLET | ORAL | 3 refills | Status: DC

## 2017-05-03 MED ORDER — AMLODIPINE BESYLATE 10 MG PO TABS: 10.0000 mg | ORAL_TABLET | Freq: Every day | ORAL | 1 refills | Status: DC

## 2017-05-03 MED ORDER — PRAVASTATIN SODIUM 40 MG PO TABS: 40.0000 mg | ORAL_TABLET | Freq: Every day | ORAL | 1 refills | Status: DC

## 2017-05-03 MED ORDER — OLMESARTAN MEDOXOMIL-HCTZ 40-25 MG PO TABS: 1.0000 | ORAL_TABLET | Freq: Every day | ORAL | 1 refills | Status: DC

## 2017-05-03 NOTE — Progress Notes (Signed)
Subjective:    Patient ID: Travis Barajas, male    DOB: January 10, 1951, 66 y.o.   MRN: [id number]  HPI  Pt is a 66 yo pleasant male with COPD, HTN, Hyperlipidemia, BPH that presents to the clinic for follow up.   HTN- he admits he has been out of his metoprolol for at least 2 weeks. He has been checking at home and running at it's highest 165/80 and lowest 130/80's. He denies any CP, palpitations, headaches, vision changes.   Pt continues to smoke but does want to quit. He is smoking 2-3 cigs a day and ready to have some help.   Pt has COPD but does not take a daily inhaler. He feels like his SOB and cough are managable. He does not want any more medications. He does use albuterol inhaler at least once a day.   .. Active Ambulatory Problems    Diagnosis Date Noted  . GERD (gastroesophageal reflux disease) 07/23/2011  . Allergic rhinitis 07/23/2011  . Joint pain 07/23/2011  . HTN (hypertension), malignant 10/01/2011  . Tobacco user 12/05/2011  . Dyspnea 10/18/2012  . Hematuria 11/07/2013  . Hyperlipidemia 11/17/2013  . BPH (benign prostatic hyperplasia) 11/21/2013  . COPD, moderate ([place]) 03/21/2014  . Erectile dysfunction 03/21/2014  . Claudication ([place]) 09/26/2014  . Generalized anxiety disorder 01/15/2015  . BPH without urinary obstruction 05/25/2016  . Acute nonseasonal allergic rhinitis due to pollen 05/04/2017  . Dyslipidemia 05/04/2017   Resolved Ambulatory Problems    Diagnosis Date Noted  . No Resolved Ambulatory Problems   Past Medical History:  Diagnosis Date  . Allergy   . Anxiety   . Arthritis   . COPD (chronic obstructive pulmonary disease) ([place])   . Hyperlipidemia   . Hypertension    .Marland Kitchen Family History  Problem Relation Age of Onset  . Diabetes Mother   . Hypertension Mother   . Alzheimer's disease Mother   . CAD Mother   . Heart disease Mother   . Hyperlipidemia Mother   . Diabetes Father   . Hypertension Father   . Cancer Brother   .  Peripheral vascular disease Brother   . Cancer Daughter    .Marland Kitchen Social History   Socioeconomic History  . Marital status: Married    Spouse name: Not on file  . Number of children: 7  . Years of education: Not on file  . Highest education level: Not on file  Social Needs  . Financial resource strain: Not on file  . Food insecurity - worry: Not on file  . Food insecurity - inability: Not on file  . Transportation needs - medical: Not on file  . Transportation needs - non-medical: Not on file  Occupational History    Comment: Retired  Tobacco Use  . Smoking status: Former Smoker    Packs/day: 0.50    Years: 40.00    Pack years: 20.00    Types: Cigarettes    Last attempt to quit: 12/15/2014    Years since quitting: 2.3  . Smokeless tobacco: Never Used  Substance and Sexual Activity  . Alcohol use: Yes    Alcohol/week: 0.0 oz    Comment: occs  . Drug use: No  . Sexual activity: Yes  Other Topics Concern  . Not on file  Social History Narrative  . Not on file     Review of Systems  All other systems reviewed and are negative.      Objective:   Physical Exam  Constitutional:  He is oriented to person, place, and time. He appears well-developed and well-nourished.  HENT:  Head: Normocephalic and atraumatic.  Cardiovascular: Normal rate, regular rhythm and normal heart sounds.  Pulmonary/Chest: Effort normal and breath sounds normal. He has no wheezes.  Neurological: He is alert and oriented to person, place, and time.  Psychiatric: He has a normal mood and affect. His behavior is normal.          Assessment & Plan:  Marland KitchenMarland KitchenGlenden Barajas was seen today for hypertension and hyperlipidemia.  Diagnoses and all orders for this visit:  HTN (hypertension), malignant -     metoprolol tartrate (LOPRESSOR) 100 MG tablet; Take 1 tablet (100 mg total) 2 (two) times daily by mouth. -     amLODipine (NORVASC) 10 MG tablet; Take 1 tablet (10 mg total) daily by mouth. -      olmesartan-hydrochlorothiazide (BENICAR HCT) 40-25 MG tablet; Take 1 tablet daily by mouth.  Dyslipidemia -     pravastatin (PRAVACHOL) 40 MG tablet; Take 1 tablet (40 mg total) daily by mouth. -     Lipid Panel w/reflex Direct LDL  Screening for diabetes mellitus -     COMPLETE METABOLIC PANEL WITH GFR  Prostate cancer screening -     PSA  Medication management -     CBC  Acute nonseasonal allergic rhinitis due to pollen -     montelukast (SINGULAIR) 10 MG tablet; TAKE 1 TABLET BY MOUTH AT  BEDTIME.  COPD, moderate (HCC) -     albuterol (PROVENTIL HFA;VENTOLIN HFA) 108 (90 Base) MCG/ACT inhaler; Inhale 2 puffs every 6 (six) hours as needed into the lungs.  Tobacco dependence -     buPROPion (ZYBAN) 150 MG 12 hr tablet; Take 1 tablet (150 mg total) 2 (two) times daily by mouth.   .. Depression screen [facility] 2/9 05/03/2017 09/02/2016  Decreased Interest 0 0  Down, Depressed, Hopeless 0 0  PHQ - 2 Score 0 0   Fasting labs ordered.   Pt needs to start back ALL BP medications and recheck with nurse visit in 2 weeks.   Discussed smoking cessation and started zyban. Discussed side effects. Follow up in 3 months.

## 2017-05-04 ENCOUNTER — Encounter: Payer: Self-pay | Admitting: Physician Assistant

## 2017-05-04 DIAGNOSIS — J301 Allergic rhinitis due to pollen: Secondary | ICD-10-CM | POA: Insufficient documentation

## 2017-05-04 DIAGNOSIS — E785 Hyperlipidemia, unspecified: Secondary | ICD-10-CM | POA: Insufficient documentation

## 2017-08-02 ENCOUNTER — Other Ambulatory Visit: Payer: Self-pay | Admitting: Physician Assistant

## 2017-08-02 DIAGNOSIS — I10 Essential (primary) hypertension: Secondary | ICD-10-CM

## 2017-09-01 ENCOUNTER — Ambulatory Visit (INDEPENDENT_AMBULATORY_CARE_PROVIDER_SITE_OTHER): Payer: Medicare HMO | Admitting: Physician Assistant

## 2017-09-01 ENCOUNTER — Encounter: Payer: Self-pay | Admitting: Physician Assistant

## 2017-09-01 VITALS — BP 167/77 | HR 99 | Ht 64.0 in | Wt 170.0 lb

## 2017-09-01 DIAGNOSIS — R0989 Other specified symptoms and signs involving the circulatory and respiratory systems: Secondary | ICD-10-CM | POA: Diagnosis not present

## 2017-09-01 DIAGNOSIS — J449 Chronic obstructive pulmonary disease, unspecified: Secondary | ICD-10-CM

## 2017-09-01 DIAGNOSIS — Z131 Encounter for screening for diabetes mellitus: Secondary | ICD-10-CM

## 2017-09-01 DIAGNOSIS — R69 Illness, unspecified: Secondary | ICD-10-CM | POA: Diagnosis not present

## 2017-09-01 DIAGNOSIS — E782 Mixed hyperlipidemia: Secondary | ICD-10-CM

## 2017-09-01 DIAGNOSIS — R042 Hemoptysis: Secondary | ICD-10-CM | POA: Diagnosis not present

## 2017-09-01 DIAGNOSIS — R011 Cardiac murmur, unspecified: Secondary | ICD-10-CM | POA: Diagnosis not present

## 2017-09-01 DIAGNOSIS — Z125 Encounter for screening for malignant neoplasm of prostate: Secondary | ICD-10-CM | POA: Diagnosis not present

## 2017-09-01 DIAGNOSIS — F17200 Nicotine dependence, unspecified, uncomplicated: Secondary | ICD-10-CM | POA: Diagnosis not present

## 2017-09-01 DIAGNOSIS — J069 Acute upper respiratory infection, unspecified: Secondary | ICD-10-CM | POA: Diagnosis not present

## 2017-09-01 DIAGNOSIS — I10 Essential (primary) hypertension: Secondary | ICD-10-CM

## 2017-09-01 DIAGNOSIS — Z0000 Encounter for general adult medical examination without abnormal findings: Secondary | ICD-10-CM | POA: Diagnosis not present

## 2017-09-01 DIAGNOSIS — B9789 Other viral agents as the cause of diseases classified elsewhere: Secondary | ICD-10-CM

## 2017-09-01 MED ORDER — IPRATROPIUM BROMIDE 0.06 % NA SOLN: 2.0000 | Freq: Four times a day (QID) | NASAL | 0 refills | Status: DC

## 2017-09-01 NOTE — Progress Notes (Signed)
Subjective:    Patient ID: Travis Barajas, male    DOB: 02/24/1951, 67 y.o.   MRN: [id number]  HPI  Bland Span is a 67 year old male with COPD and current smoker that presents today with complaints related to his sinuses. He states that for the past 4-5 days he has noticed a little maxillary and ethmoidal sinus pressure with increased congestion. He denies any fevers, chills, nausea, vomiting. He does however state that he had one episode of hemoptysis after brushing his teeth 2 days ago. He states that he coughed up some sputum that was a "dull-red" color. He denies bright red blood. He continues to smoke; he denies any weight loss, chest pain, night sweats, increased/worsening shortness of breath.  .. Active Ambulatory Problems    Diagnosis Date Noted  . GERD (gastroesophageal reflux disease) 07/23/2011  . Allergic rhinitis 07/23/2011  . Joint pain 07/23/2011  . HTN (hypertension), malignant 10/01/2011  . Tobacco user 12/05/2011  . Dyspnea 10/18/2012  . Hematuria 11/07/2013  . Hyperlipidemia 11/17/2013  . BPH (benign prostatic hyperplasia) 11/21/2013  . COPD, moderate ([place]) 03/21/2014  . Erectile dysfunction 03/21/2014  . Claudication ([place]) 09/26/2014  . Generalized anxiety disorder 01/15/2015  . BPH without urinary obstruction 05/25/2016  . Acute nonseasonal allergic rhinitis due to pollen 05/04/2017  . Dyslipidemia 05/04/2017   Resolved Ambulatory Problems    Diagnosis Date Noted  . No Resolved Ambulatory Problems   Past Medical History:  Diagnosis Date  . Allergy   . Anxiety   . Arthritis   . COPD (chronic obstructive pulmonary disease) ([place])   . Hyperlipidemia   . Hypertension       Review of Systems  Constitutional: Negative for appetite change, chills, diaphoresis, fever and unexpected weight change.  HENT: Positive for congestion, postnasal drip and sinus pressure. Negative for ear discharge, ear pain, facial swelling and sore throat.   Respiratory: Negative  for chest tightness, shortness of breath and wheezing.   Cardiovascular: Negative for chest pain and palpitations.  Gastrointestinal: Negative for nausea and vomiting.       Positive for one episode of hemoptysis  Musculoskeletal: Negative for neck pain.  Neurological: Negative for dizziness.       Objective:   Physical Exam  Constitutional: He is oriented to person, place, and time. He appears well-developed and well-nourished. No distress.  HENT:  Head: Normocephalic and atraumatic.  Right Ear: External ear normal.  Left Ear: External ear normal.  Eyes: Conjunctivae are normal.  Neck: Normal range of motion. Neck supple.  Left carotid bruit noted  Cardiovascular: Normal rate, regular rhythm and intact distal pulses.  Murmur ( at the mitral area) heard. Pulmonary/Chest: Effort normal and breath sounds normal. No respiratory distress. He has no wheezes.  Neurological: He is alert and oriented to person, place, and time.  Skin: Skin is warm and dry.  Psychiatric: He has a normal mood and affect. His behavior is normal.          Assessment & Plan:  Marland KitchenMarland KitchenKinston Magnan was seen today for nasal congestion.  Diagnoses and all orders for this visit:  Viral URI with cough -     DG Chest 2 View -     ipratropium (ATROVENT) 0.06 % nasal spray; Place 2 sprays into both nostrils 4 (four) times daily.  COPD, moderate ([place])  Hemoptysis -     DG Chest 2 View  Current smoker  Mixed hyperlipidemia -     Lipid Panel w/reflex Direct LDL  Screening for diabetes mellitus -     COMPLETE METABOLIC PANEL WITH GFR  Prostate cancer screening -     PSA  Preventative health care -     Lipid Panel w/reflex Direct LDL -     COMPLETE METABOLIC PANEL WITH GFR -     PSA -     CBC with Differential/Platelet   Pt has not done a lot of his preventative labs that were ordered at last visit. reordered labs.   Discussed smoking cessation. Pt has cut back and agrees to stop in the next 2 weeks.    Left carotid bruit heard on exam today. Will get carotid u/s but patient would like to hold off on order until he follows up in 2 weeks.   mumur heard over mitral valve. Hx of mitral regurgitation. Needs repeat echo. Will order at patient's request at next follow up.   Due to smoking hx and episode of hemoptysis will get CXR. Symptoms are suggestive of viral etiology. Discussed symptomatic care and gave atrovent to use up to 4 times a day.   BP is NOT controlled. He has not taken his blood pressure medications today. He is asymptomatic. STRONGLY urged to take medication daily. Will make medication adjustments if still elevated in 2 weeks. Keep BP log.   Spent a lot of time discussing importance of follow up and management of lots of conditions. Pt agrees to come back.   Marland Kitchen.Spent 40 minutes with patient and greater than 50 percent of visit spent counseling patient regarding treatment plan.

## 2017-09-01 NOTE — Patient Instructions (Addendum)
Mucinex ONLY.  Nasal spray sent to pharmacy 4 times a day.   Follow up in 2 weeks. GET LABS before.   Upper Respiratory Infection, Adult Most upper respiratory infections (URIs) are caused by a virus. A URI affects the nose, throat, and upper air passages. The most common type of URI is often called "the common cold." Follow these instructions at home:  Take medicines only as told by your doctor.  Gargle warm saltwater or take cough drops to comfort your throat as told by your doctor.  Use a warm mist humidifier or inhale steam from a shower to increase air moisture. This may make it easier to breathe.  Drink enough fluid to keep your pee (urine) clear or pale yellow.  Eat soups and other clear broths.  Have a healthy diet.  Rest as needed.  Go back to work when your fever is gone or your doctor says it is okay. ? You may need to stay home longer to avoid giving your URI to others. ? You can also wear a face mask and wash your hands often to prevent spread of the virus.  Use your inhaler more if you have asthma.  Do not use any tobacco products, including cigarettes, chewing tobacco, or electronic cigarettes. If you need help quitting, ask your doctor. Contact a doctor if:  You are getting worse, not better.  Your symptoms are not helped by medicine.  You have chills.  You are getting more short of breath.  You have brown or red mucus.  You have yellow or brown discharge from your nose.  You have pain in your face, especially when you bend forward.  You have a fever.  You have puffy (swollen) neck glands.  You have pain while swallowing.  You have white areas in the back of your throat. Get help right away if:  You have very bad or constant: ? Headache. ? Ear pain. ? Pain in your forehead, behind your eyes, and over your cheekbones (sinus pain). ? Chest pain.  You have long-lasting (chronic) lung disease and any of the  following: ? Wheezing. ? Long-lasting cough. ? Coughing up blood. ? A change in your usual mucus.  You have a stiff neck.  You have changes in your: ? Vision. ? Hearing. ? Thinking. ? Mood. This information is not intended to replace advice given to you by your health care provider. Make sure you discuss any questions you have with your health care provider. Document Released: 11/18/2007 Document Revised: 02/02/2016 Document Reviewed: 09/06/2013 Elsevier Interactive Patient Education  2018 [employer].

## 2017-09-01 NOTE — Progress Notes (Deleted)
   Subjective:    Patient ID: Travis Barajas, male    DOB: [birth year], 67 y.o.   MRN: [id number]  HPI  Echo/carotid doppler  No alcohol 1 year.   Pt   Review of Systems     Objective:   Physical Exam  Constitutional: He appears well-developed and well-nourished.          Assessment & Plan:

## 2017-10-17 ENCOUNTER — Other Ambulatory Visit: Payer: Self-pay | Admitting: Physician Assistant

## 2017-10-17 DIAGNOSIS — N400 Enlarged prostate without lower urinary tract symptoms: Secondary | ICD-10-CM

## 2017-11-26 ENCOUNTER — Encounter: Payer: Self-pay | Admitting: Physician Assistant

## 2017-11-26 ENCOUNTER — Ambulatory Visit (INDEPENDENT_AMBULATORY_CARE_PROVIDER_SITE_OTHER): Payer: Medicare HMO | Admitting: Physician Assistant

## 2017-11-26 VITALS — BP 137/63 | HR 73 | Temp 98.4°F | Wt 169.2 lb

## 2017-11-26 DIAGNOSIS — Z0000 Encounter for general adult medical examination without abnormal findings: Secondary | ICD-10-CM | POA: Diagnosis not present

## 2017-11-26 DIAGNOSIS — Z131 Encounter for screening for diabetes mellitus: Secondary | ICD-10-CM | POA: Diagnosis not present

## 2017-11-26 DIAGNOSIS — K0889 Other specified disorders of teeth and supporting structures: Secondary | ICD-10-CM | POA: Diagnosis not present

## 2017-11-26 DIAGNOSIS — Z125 Encounter for screening for malignant neoplasm of prostate: Secondary | ICD-10-CM | POA: Diagnosis not present

## 2017-11-26 DIAGNOSIS — E782 Mixed hyperlipidemia: Secondary | ICD-10-CM | POA: Diagnosis not present

## 2017-11-26 MED ORDER — AMOXICILLIN-POT CLAVULANATE 875-125 MG PO TABS: 1.0000 | ORAL_TABLET | Freq: Two times a day (BID) | ORAL | 0 refills | Status: DC

## 2017-11-26 MED ORDER — HYDROCODONE-ACETAMINOPHEN 5-325 MG PO TABS: 1.0000 | ORAL_TABLET | Freq: Four times a day (QID) | ORAL | 0 refills | Status: AC | PRN

## 2017-11-26 NOTE — Patient Instructions (Signed)
Dental Pain Dental pain may be caused by many things, including:  Tooth decay (cavities or caries). Cavities cause the nerve of your tooth to be open to air and hot or cold temperatures. This can cause pain or discomfort.  Abscess or infection. A dental abscess is an area that is full of infected pus from a bacterial infection in the inner part of the tooth (pulp). It usually happens at the end of the tooth's root.  Injury.  An unknown reason (idiopathic).  Your pain may be mild or severe. It may only happen when:  You are chewing.  You are exposed to hot or cold temperature.  You are eating or drinking sugary foods or beverages, such as: ? Soda. ? Candy.  Your pain may also be there all of the time. Follow these instructions at home: Watch your dental pain for any changes. Do these things to lessen your discomfort:  Take medicines only as told by your dentist.  If your dentist tells you to take an antibiotic medicine, finish all of it even if you start to feel better.  Keep all follow-up visits as told by your dentist. This is important.  Do not apply heat to the outside of your face.  Rinse your mouth or gargle with salt water if told by your dentist. This helps with pain and swelling. ? You can make salt water by adding  tsp of salt to 1 cup of warm water.  Apply ice to the painful area of your face: ? Put ice in a plastic bag. ? Place a towel between your skin and the bag. ? Leave the ice on for 20 minutes, 2-3 times per day.  Avoid foods or drinks that cause you pain, such as: ? Very hot or very cold foods or drinks. ? Sweet or sugary foods or drinks.  Contact a doctor if:  Your pain is not helped with medicines.  Your symptoms are worse.  You have new symptoms. Get help right away if:  You cannot open your mouth.  You are having trouble breathing or swallowing.  You have a fever.  Your face, neck, or jaw is puffy (swollen). This information is not  intended to replace advice given to you by your health care provider. Make sure you discuss any questions you have with your health care provider. Document Released: 11/18/2007 Document Revised: 11/07/2015 Document Reviewed: 05/28/2014 Elsevier Interactive Patient Education  2018 [employer] Inc[employer]  

## 2017-11-26 NOTE — Progress Notes (Signed)
   Subjective:    Patient ID: Travis Barajas, male    DOB: [birth year], 67 y.o.   MRN: [id number]  HPI  Pt is a 67 yo male with HTN, COPD, GERD who presents to the clinic with left lower palate tooth pain over the last 2 molars. Hx of cavities and having to have teeth pull. Denies any fever, chills, body aches. He is requesting pain medication until Thursday appt with dentist to have tooth pulled. He is taking naproxen bid daily. He has tried some olive oil to help with pain. Rates pain 6-8 on a pain scale.   .. Active Ambulatory Problems    Diagnosis Date Noted  . GERD (gastroesophageal reflux disease) 07/23/2011  . Allergic rhinitis 07/23/2011  . Joint pain 07/23/2011  . HTN (hypertension), malignant 10/01/2011  . Tobacco user 12/05/2011  . Dyspnea 10/18/2012  . Hematuria 11/07/2013  . Hyperlipidemia 11/17/2013  . BPH (benign prostatic hyperplasia) 11/21/2013  . COPD, moderate ([place]) 03/21/2014  . Erectile dysfunction 03/21/2014  . Claudication ([place]) 09/26/2014  . Generalized anxiety disorder 01/15/2015  . BPH without urinary obstruction 05/25/2016  . Acute nonseasonal allergic rhinitis due to pollen 05/04/2017  . Dyslipidemia 05/04/2017  . Current smoker 09/01/2017  . Hemoptysis 09/01/2017  . Left carotid bruit 09/01/2017  . Systolic murmur 44/92/0100   Resolved Ambulatory Problems    Diagnosis Date Noted  . No Resolved Ambulatory Problems   Past Medical History:  Diagnosis Date  . Allergy   . Anxiety   . Arthritis   . COPD (chronic obstructive pulmonary disease) ([place])   . Hyperlipidemia   . Hypertension      Review of Systems  All other systems reviewed and are negative.      Objective:   Physical Exam  Constitutional: He appears well-developed and well-nourished.  HENT:  Head: Normocephalic and atraumatic.  Nose: Nose normal.  Mouth/Throat: Oropharynx is clear and moist.    Overall poor dentition.   Psychiatric: He has a normal mood and affect. His  behavior is normal.          Assessment & Plan:  Marland KitchenMarland KitchenAlezander Dimaano was seen today for dental pain.  Diagnoses and all orders for this visit:  Tooth pain -     HYDROcodone-acetaminophen (NORCO/VICODIN) 5-325 MG tablet; Take 1 tablet by mouth every 6 (six) hours as needed for up to 5 days for moderate pain. -     amoxicillin-clavulanate (AUGMENTIN) 875-125 MG tablet; Take 1 tablet by mouth 2 (two) times daily. For 10 days.   Gums looked erythematous and swollen. Will treat for infection. Keep appt with dentist on Thursday. norco given for up to 5 days for pain control. Continue on NSAID. Discussed side effects and abuse potential of opiates. Discussed STOP ACT of only giving 5 days of opiates for moderate to severe pain.   [place] controlled substance database reviewed without any concerns.

## 2017-11-27 LAB — CBC WITH DIFFERENTIAL/PLATELET
BASOS ABS: 60 {cells}/uL (ref 0–200)
BASOS PCT: 0.7 %
EOS PCT: 6.1 %
Eosinophils Absolute: 519 cells/uL — ABNORMAL HIGH (ref 15–500)
HEMATOCRIT: 43.2 % (ref 38.5–50.0)
HEMOGLOBIN: 14.3 g/dL (ref 13.2–17.1)
LYMPHS ABS: 2312 {cells}/uL (ref 850–3900)
MCH: 26.2 pg — ABNORMAL LOW (ref 27.0–33.0)
MCHC: 33.1 g/dL (ref 32.0–36.0)
MCV: 79.3 fL — ABNORMAL LOW (ref 80.0–100.0)
MONOS PCT: 6.2 %
NEUTROS ABS: 5083 {cells}/uL (ref 1500–7800)
Neutrophils Relative %: 59.8 %
Platelets: 139 10*3/uL — ABNORMAL LOW (ref 140–400)
RBC: 5.45 10*6/uL (ref 4.20–5.80)
RDW: 14 % (ref 11.0–15.0)
Total Lymphocyte: 27.2 %
WBC mixed population: 527 cells/uL (ref 200–950)
WBC: 8.5 10*3/uL (ref 3.8–10.8)

## 2017-11-27 LAB — LIPID PANEL W/REFLEX DIRECT LDL
CHOLESTEROL: 184 mg/dL (ref <200)
HDL: 41 mg/dL (ref >40)
LDL Cholesterol (Calc): 122 mg/dL (calc) — ABNORMAL HIGH
Non-HDL Cholesterol (Calc): 143 mg/dL (calc) — ABNORMAL HIGH (ref <130)
Total CHOL/HDL Ratio: 4.5 (calc) (ref <5.0)
Triglycerides: 103 mg/dL (ref <150)

## 2017-11-27 LAB — COMPLETE METABOLIC PANEL WITH GFR
AG RATIO: 1.5 (calc) (ref 1.0–2.5)
ALT: 14 U/L (ref 9–46)
AST: 15 U/L (ref 10–35)
Albumin: 4.1 g/dL (ref 3.6–5.1)
Alkaline phosphatase (APISO): 98 U/L (ref 40–115)
BUN: 14 mg/dL (ref 7–25)
CALCIUM: 9.3 mg/dL (ref 8.6–10.3)
CO2: 30 mmol/L (ref 20–32)
CREATININE: 0.89 mg/dL (ref 0.70–1.25)
Chloride: 104 mmol/L (ref 98–110)
GFR, EST AFRICAN AMERICAN: 103 mL/min/{1.73_m2} (ref >=60)
GFR, EST NON AFRICAN AMERICAN: 89 mL/min/{1.73_m2} (ref >=60)
GLOBULIN: 2.8 g/dL (ref 1.9–3.7)
Glucose, Bld: 104 mg/dL — ABNORMAL HIGH (ref 65–99)
Potassium: 3.8 mmol/L (ref 3.5–5.3)
SODIUM: 140 mmol/L (ref 135–146)
TOTAL PROTEIN: 6.9 g/dL (ref 6.1–8.1)
Total Bilirubin: 0.5 mg/dL (ref 0.2–1.2)

## 2017-11-27 LAB — PSA: PSA: 1.1 ng/mL (ref <=4.0)

## 2017-11-29 NOTE — Progress Notes (Signed)
Call pt: cholesterol much better. Kidney, liver look good. Elevated glucose if you were fasting. Were you fasting? PSA stable.

## 2017-12-02 DIAGNOSIS — R69 Illness, unspecified: Secondary | ICD-10-CM | POA: Diagnosis not present

## 2017-12-09 DIAGNOSIS — H52223 Regular astigmatism, bilateral: Secondary | ICD-10-CM | POA: Diagnosis not present

## 2017-12-09 DIAGNOSIS — R69 Illness, unspecified: Secondary | ICD-10-CM | POA: Diagnosis not present

## 2018-01-04 ENCOUNTER — Other Ambulatory Visit: Payer: Self-pay | Admitting: Physician Assistant

## 2018-01-04 DIAGNOSIS — E785 Hyperlipidemia, unspecified: Secondary | ICD-10-CM

## 2018-01-04 DIAGNOSIS — I10 Essential (primary) hypertension: Secondary | ICD-10-CM

## 2018-01-18 DIAGNOSIS — Z0100 Encounter for examination of eyes and vision without abnormal findings: Secondary | ICD-10-CM | POA: Diagnosis not present

## 2018-04-28 ENCOUNTER — Other Ambulatory Visit: Payer: Self-pay

## 2018-04-28 DIAGNOSIS — M2550 Pain in unspecified joint: Secondary | ICD-10-CM

## 2018-04-28 MED ORDER — NAPROXEN 500 MG PO TABS: 500.0000 mg | ORAL_TABLET | Freq: Two times a day (BID) | ORAL | 1 refills | Status: DC

## 2018-04-29 ENCOUNTER — Ambulatory Visit: Payer: Medicare HMO | Admitting: Physician Assistant

## 2018-05-19 ENCOUNTER — Other Ambulatory Visit: Payer: Self-pay

## 2018-05-19 DIAGNOSIS — J301 Allergic rhinitis due to pollen: Secondary | ICD-10-CM

## 2018-05-19 MED ORDER — MONTELUKAST SODIUM 10 MG PO TABS: ORAL_TABLET | ORAL | 3 refills | Status: DC

## 2018-10-03 ENCOUNTER — Ambulatory Visit (INDEPENDENT_AMBULATORY_CARE_PROVIDER_SITE_OTHER): Payer: Medicare HMO | Admitting: Physician Assistant

## 2018-10-03 ENCOUNTER — Encounter: Payer: Self-pay | Admitting: Physician Assistant

## 2018-10-03 VITALS — BP 176/69 | HR 65 | Temp 98.5°F | Ht 64.0 in | Wt 155.0 lb

## 2018-10-03 DIAGNOSIS — G479 Sleep disorder, unspecified: Secondary | ICD-10-CM

## 2018-10-03 DIAGNOSIS — E785 Hyperlipidemia, unspecified: Secondary | ICD-10-CM | POA: Diagnosis not present

## 2018-10-03 DIAGNOSIS — T148XXA Other injury of unspecified body region, initial encounter: Secondary | ICD-10-CM | POA: Diagnosis not present

## 2018-10-03 DIAGNOSIS — J449 Chronic obstructive pulmonary disease, unspecified: Secondary | ICD-10-CM | POA: Diagnosis not present

## 2018-10-03 DIAGNOSIS — N400 Enlarged prostate without lower urinary tract symptoms: Secondary | ICD-10-CM | POA: Diagnosis not present

## 2018-10-03 DIAGNOSIS — J301 Allergic rhinitis due to pollen: Secondary | ICD-10-CM | POA: Diagnosis not present

## 2018-10-03 DIAGNOSIS — I10 Essential (primary) hypertension: Secondary | ICD-10-CM | POA: Diagnosis not present

## 2018-10-03 MED ORDER — ALBUTEROL SULFATE HFA 108 (90 BASE) MCG/ACT IN AERS: 2.0000 | INHALATION_SPRAY | Freq: Four times a day (QID) | RESPIRATORY_TRACT | 2 refills | Status: DC | PRN

## 2018-10-03 MED ORDER — TAMSULOSIN HCL 0.4 MG PO CAPS: ORAL_CAPSULE | ORAL | 3 refills | Status: DC

## 2018-10-03 MED ORDER — TRAZODONE HCL 50 MG PO TABS: 50.0000 mg | ORAL_TABLET | Freq: Every day | ORAL | 2 refills | Status: DC

## 2018-10-03 MED ORDER — AMLODIPINE BESYLATE 10 MG PO TABS: 10.0000 mg | ORAL_TABLET | Freq: Every day | ORAL | 1 refills | Status: DC

## 2018-10-03 MED ORDER — DOXAZOSIN MESYLATE 4 MG PO TABS: 4.0000 mg | ORAL_TABLET | Freq: Every day | ORAL | 3 refills | Status: DC

## 2018-10-03 MED ORDER — OLMESARTAN MEDOXOMIL-HCTZ 40-25 MG PO TABS: 1.0000 | ORAL_TABLET | Freq: Every day | ORAL | 1 refills | Status: DC

## 2018-10-03 MED ORDER — PRAVASTATIN SODIUM 40 MG PO TABS: 40.0000 mg | ORAL_TABLET | Freq: Every day | ORAL | 3 refills | Status: DC

## 2018-10-03 MED ORDER — MONTELUKAST SODIUM 10 MG PO TABS: ORAL_TABLET | ORAL | 3 refills | Status: DC

## 2018-10-03 MED ORDER — METOPROLOL TARTRATE 100 MG PO TABS: 100.0000 mg | ORAL_TABLET | Freq: Two times a day (BID) | ORAL | 1 refills | Status: DC

## 2018-10-03 NOTE — Patient Instructions (Signed)
Contusion    A contusion is a deep bruise. Contusions happen when an injury causes bleeding under the skin. Symptoms of bruising include pain, swelling, and discolored skin. The skin may turn blue, purple, or yellow.  Follow these instructions at home:   Rest the injured area.   If told, put ice on the injured area.  ? Put ice in a plastic bag.  ? Place a towel between your skin and the bag.  ? Leave the ice on for 20 minutes, 2-3 times per day.   If told, put light pressure (compression) on the injured area using an elastic bandage. Make sure the bandage is not too tight. Remove it and put it back on as told by your doctor.   If possible, raise (elevate) the injured area above the level of your heart while you are sitting or lying down.   Take over-the-counter and prescription medicines only as told by your doctor.  Contact a doctor if:   Your symptoms do not get better after several days of treatment.   Your symptoms get worse.   You have trouble moving the injured area.  Get help right away if:   You have very bad pain.   You have a loss of feeling (numbness) in a hand or foot.   Your hand or foot turns pale or cold.  This information is not intended to replace advice given to you by your health care provider. Make sure you discuss any questions you have with your health care provider.  Document Released: 11/18/2007 Document Revised: 11/07/2015 Document Reviewed: 10/17/2014  Elsevier Interactive Patient Education  2019 Elsevier Inc.

## 2018-10-03 NOTE — Progress Notes (Signed)
Subjective:    Patient ID: Travis Barajas, male    DOB: 1950/07/05, 68 y.o.   MRN: [id number]  HPI  Pt is a 68 yo male with HTN, Seasonal allergies, BPH, dyslipidemia, COPD who presents to the clinic to have an area on his right upper arm looked at. He noticed it 1-2 weeks ago but seems to be getting bigger and changing colors. He put neosporin and desitin on it with no benefit. Pt denies any pain or itching. No known trauma or bug bite. No ASA use.   Pt admits he is NOT taking any of his medication. Pt denies any symptoms or concerns.   He does report some trouble sleeping. Request medication. Taken OTC zzquil with little relief.   .. Active Ambulatory Problems    Diagnosis Date Noted  . GERD (gastroesophageal reflux disease) 07/23/2011  . Allergic rhinitis 07/23/2011  . Joint pain 07/23/2011  . HTN (hypertension), malignant 10/01/2011  . Tobacco user 12/05/2011  . Dyspnea 10/18/2012  . Hematuria 11/07/2013  . Hyperlipidemia 11/17/2013  . BPH (benign prostatic hyperplasia) 11/21/2013  . COPD, moderate ([place]) 03/21/2014  . Erectile dysfunction 03/21/2014  . Claudication ([place]) 09/26/2014  . Generalized anxiety disorder 01/15/2015  . BPH without urinary obstruction 05/25/2016  . Acute nonseasonal allergic rhinitis due to pollen 05/04/2017  . Dyslipidemia 05/04/2017  . Current smoker 09/01/2017  . Hemoptysis 09/01/2017  . Left carotid bruit 09/01/2017  . Systolic murmur 87/56/4332  . Trouble in sleeping 10/05/2018   Resolved Ambulatory Problems    Diagnosis Date Noted  . No Resolved Ambulatory Problems   Past Medical History:  Diagnosis Date  . Allergy   . Anxiety   . Arthritis   . COPD (chronic obstructive pulmonary disease) ([place])   . Hypertension                  Review of Systems  All other systems reviewed and are negative.      Objective:   Physical Exam Vitals signs reviewed.  Constitutional:      Appearance: Normal appearance.  HENT:     Head:  Normocephalic.  Cardiovascular:     Rate and Rhythm: Normal rate and regular rhythm.     Pulses: Normal pulses.  Pulmonary:     Effort: Pulmonary effort is normal.     Breath sounds: Normal breath sounds.  Skin:    Comments: Right upper bicep 2 inch by 1.5inch yellow to green discoloration with purple center. Not tender, swollen, red, or warm. No scales.   Neurological:     General: No focal deficit present.     Mental Status: He is alert and oriented to person, place, and time.  Psychiatric:        Mood and Affect: Mood normal.           Assessment & Plan:  Marland KitchenMarland KitchenKhaza Barajas was seen today for rash.  Diagnoses and all orders for this visit:  Bruise  HTN (hypertension), malignant -     amLODipine (NORVASC) 10 MG tablet; Take 1 tablet (10 mg total) by mouth daily. -     olmesartan-hydrochlorothiazide (BENICAR HCT) 40-25 MG tablet; Take 1 tablet by mouth daily. -     metoprolol tartrate (LOPRESSOR) 100 MG tablet; Take 1 tablet (100 mg total) by mouth 2 (two) times daily.  BPH without urinary obstruction -     doxazosin (CARDURA) 4 MG tablet; Take 1 tablet (4 mg total) by mouth daily. -     tamsulosin (  FLOMAX) 0.4 MG CAPS capsule; TAKE 1 CAPSULE BY MOUTH  DAILY.  Dyslipidemia -     pravastatin (PRAVACHOL) 40 MG tablet; Take 1 tablet (40 mg total) by mouth daily.  COPD, moderate ([facility]) -     albuterol (VENTOLIN HFA) 108 (90 Base) MCG/ACT inhaler; Inhale 2 puffs into the lungs every 6 (six) hours as needed.  Acute nonseasonal allergic rhinitis due to pollen -     montelukast (SINGULAIR) 10 MG tablet; TAKE 1 TABLET BY MOUTH AT  BEDTIME.  Trouble in sleeping -     traZODone (DESYREL) 50 MG tablet; Take 1 tablet (50 mg total) by mouth at bedtime.   Reassured patient appeared to be a bruise. Cool compresses and time should resolve. Follow up as needed or worsening symptoms.   LONG discussed about medications. He MUST take medications. Refilled everything and needs to follow up  in 2 weeks. BP is crazy high. This is a huge risk factor for CV event. Pt aware. He will restart medication.   Discussed smoking cessation. He is doing it on is "own". Down to 1-3 cigs a day.

## 2018-10-05 DIAGNOSIS — G479 Sleep disorder, unspecified: Secondary | ICD-10-CM | POA: Insufficient documentation

## 2018-10-11 ENCOUNTER — Telehealth: Payer: Self-pay

## 2018-10-11 MED ORDER — TELMISARTAN-HCTZ 80-25 MG PO TABS: 1.0000 | ORAL_TABLET | Freq: Every day | ORAL | 1 refills | Status: DC

## 2018-10-11 NOTE — Addendum Note (Signed)
Addended by: Towana Badger on: 10/11/2018 11:47 AM   Modules accepted: Orders

## 2018-10-11 NOTE — Telephone Encounter (Signed)
Received fax from [facility] that Pt's Benicar-Hct is not currently available.   Please advise

## 2018-10-11 NOTE — Telephone Encounter (Signed)
Sent micardis/hct to [facility]. Recheck in office BP in 4 weeks after starting.

## 2018-10-11 NOTE — Telephone Encounter (Signed)
Pt's wife advised of medication update and appt. She will call to schedule follow up when pt starts medication.   Med list updated.

## 2018-10-14 ENCOUNTER — Telehealth: Payer: Self-pay | Admitting: Family Medicine

## 2018-10-14 NOTE — Telephone Encounter (Signed)
error 

## 2018-10-26 DIAGNOSIS — R69 Illness, unspecified: Secondary | ICD-10-CM | POA: Diagnosis not present

## 2018-10-31 DIAGNOSIS — R69 Illness, unspecified: Secondary | ICD-10-CM | POA: Diagnosis not present

## 2019-01-21 ENCOUNTER — Other Ambulatory Visit: Payer: Self-pay | Admitting: Family Medicine

## 2019-01-21 DIAGNOSIS — E785 Hyperlipidemia, unspecified: Secondary | ICD-10-CM

## 2019-01-21 DIAGNOSIS — I10 Essential (primary) hypertension: Secondary | ICD-10-CM

## 2019-02-03 ENCOUNTER — Other Ambulatory Visit: Payer: Self-pay | Admitting: Physician Assistant

## 2019-02-03 DIAGNOSIS — M2550 Pain in unspecified joint: Secondary | ICD-10-CM

## 2019-02-12 ENCOUNTER — Other Ambulatory Visit: Payer: Self-pay | Admitting: Physician Assistant

## 2019-02-12 DIAGNOSIS — M2550 Pain in unspecified joint: Secondary | ICD-10-CM

## 2019-02-22 ENCOUNTER — Other Ambulatory Visit: Payer: Self-pay

## 2019-02-22 ENCOUNTER — Ambulatory Visit (INDEPENDENT_AMBULATORY_CARE_PROVIDER_SITE_OTHER): Payer: Medicare HMO

## 2019-02-22 ENCOUNTER — Ambulatory Visit (INDEPENDENT_AMBULATORY_CARE_PROVIDER_SITE_OTHER): Payer: Medicare HMO | Admitting: Physician Assistant

## 2019-02-22 VITALS — BP 188/92 | HR 68 | Temp 98.6°F | Ht 63.5 in | Wt 179.0 lb

## 2019-02-22 DIAGNOSIS — G459 Transient cerebral ischemic attack, unspecified: Secondary | ICD-10-CM

## 2019-02-22 DIAGNOSIS — J449 Chronic obstructive pulmonary disease, unspecified: Secondary | ICD-10-CM

## 2019-02-22 DIAGNOSIS — R42 Dizziness and giddiness: Secondary | ICD-10-CM

## 2019-02-22 DIAGNOSIS — E782 Mixed hyperlipidemia: Secondary | ICD-10-CM

## 2019-02-22 DIAGNOSIS — R1310 Dysphagia, unspecified: Secondary | ICD-10-CM

## 2019-02-22 DIAGNOSIS — Z79899 Other long term (current) drug therapy: Secondary | ICD-10-CM

## 2019-02-22 DIAGNOSIS — G479 Sleep disorder, unspecified: Secondary | ICD-10-CM | POA: Diagnosis not present

## 2019-02-22 DIAGNOSIS — J302 Other seasonal allergic rhinitis: Secondary | ICD-10-CM

## 2019-02-22 DIAGNOSIS — I10 Essential (primary) hypertension: Secondary | ICD-10-CM | POA: Diagnosis not present

## 2019-02-22 DIAGNOSIS — R4781 Slurred speech: Secondary | ICD-10-CM

## 2019-02-22 DIAGNOSIS — I739 Peripheral vascular disease, unspecified: Secondary | ICD-10-CM | POA: Diagnosis not present

## 2019-02-22 DIAGNOSIS — F419 Anxiety disorder, unspecified: Secondary | ICD-10-CM

## 2019-02-22 MED ORDER — TELMISARTAN-HCTZ 80-25 MG PO TABS: 1.0000 | ORAL_TABLET | Freq: Every day | ORAL | 1 refills | Status: DC

## 2019-02-22 MED ORDER — ALPRAZOLAM 0.5 MG PO TABS: 0.5000 mg | ORAL_TABLET | Freq: Two times a day (BID) | ORAL | 1 refills | Status: DC | PRN

## 2019-02-22 MED ORDER — AMLODIPINE BESYLATE 10 MG PO TABS: 10.0000 mg | ORAL_TABLET | Freq: Every day | ORAL | 1 refills | Status: DC

## 2019-02-22 MED ORDER — TRAZODONE HCL 50 MG PO TABS: 50.0000 mg | ORAL_TABLET | Freq: Every day | ORAL | 5 refills | Status: DC

## 2019-02-22 MED ORDER — LEVOCETIRIZINE DIHYDROCHLORIDE 5 MG PO TABS: 5.0000 mg | ORAL_TABLET | Freq: Every day | ORAL | 3 refills | Status: DC

## 2019-02-22 MED ORDER — ALBUTEROL SULFATE HFA 108 (90 BASE) MCG/ACT IN AERS: 2.0000 | INHALATION_SPRAY | Freq: Four times a day (QID) | RESPIRATORY_TRACT | 1 refills | Status: DC | PRN

## 2019-02-22 NOTE — Progress Notes (Signed)
Subjective:    Patient ID: Silvan Feider, male    DOB: [birth year], 68 y.o.   MRN: [id number]  HPI  Patient is a 68 year old male with hypertension, PVD, hyperlipidemia, claudication, COPD and current smoker who presents to the clinic today with some concerns.  He is accompanied by his wife.  Patient admits he has not been taking his medication as directed.  He misses multiple doses.  He admits he is having more more shortness of breath.  He is using the albuterol inhaler at least once a day and sometimes more.  He is not on any daily medication for COPD.  He continues to smoke cigars multiple times a day.  He did stop smoking cigarettes.  He is not checking his blood pressures at home.  Yesterday he started having some symptoms that concern him.  He bent over and felt a stretch in his right upper quadrant.  When he came back up he did not feel right.  He had some dizziness, speech slurred, headache.  His wife noticed he was not acting like his normal self.  She cannot understand what he was saying and he was having trouble with his balance.  He continued to have problems swallowing.  Today he is feeling much better.  He is drinking and eating.  He continues to feel dizzy when he is walking but he can do that.  He does not feel like he has lost any strength in his upper or lower extremities.  His lower extremities are always achy.   .. Active Ambulatory Problems    Diagnosis Date Noted  . GERD (gastroesophageal reflux disease) 07/23/2011  . Allergic rhinitis 07/23/2011  . Joint pain 07/23/2011  . HTN (hypertension), malignant 10/01/2011  . Tobacco user 12/05/2011  . Dyspnea 10/18/2012  . Hematuria 11/07/2013  . Hyperlipidemia 11/17/2013  . BPH (benign prostatic hyperplasia) 11/21/2013  . COPD, moderate ([place]) 03/21/2014  . Erectile dysfunction 03/21/2014  . Claudication ([place]) 09/26/2014  . Generalized anxiety disorder 01/15/2015  . BPH without urinary obstruction 05/25/2016  .  Acute nonseasonal allergic rhinitis due to pollen 05/04/2017  . Dyslipidemia 05/04/2017  . Current smoker 09/01/2017  . Hemoptysis 09/01/2017  . Left carotid bruit 09/01/2017  . Systolic murmur [id number]  . Trouble in sleeping 10/05/2018  . PVD (peripheral vascular disease) with claudication ([place]) 02/22/2019  . Thrombocytopenia ([place]) 02/24/2019  . Slurred speech 02/24/2019  . Dizziness 02/24/2019  . Dysphagia 02/24/2019  . TIA (transient ischemic attack) 02/24/2019   Resolved Ambulatory Problems    Diagnosis Date Noted  . No Resolved Ambulatory Problems   Past Medical History:  Diagnosis Date  . Allergy   . Anxiety   . Arthritis   . COPD (chronic obstructive pulmonary disease) ([place])   . Hypertension       Review of Systems See HPI.     Objective:   Physical Exam Vitals signs reviewed.  Constitutional:      Appearance: Normal appearance. He is not diaphoretic.  HENT:     Head: Normocephalic.     Right Ear: Tympanic membrane normal.     Left Ear: Tympanic membrane normal.     Nose: Nose normal.  Eyes:     Extraocular Movements: Extraocular movements intact.     Pupils: Pupils are equal, round, and reactive to light.  Cardiovascular:     Rate and Rhythm: Normal rate and regular rhythm.  Pulmonary:     Effort: Pulmonary effort is normal.     Breath  sounds: Normal breath sounds.  Musculoskeletal:     Comments: Upper and lower extremity 5/5 strength.  5/5 hand grip.   Neurological:     General: No focal deficit present.     Mental Status: He is alert and oriented to person, place, and time.     Cranial Nerves: No cranial nerve deficit.     Sensory: No sensory deficit.     Motor: No weakness.     Coordination: Coordination normal.     Gait: Gait normal.     Deep Tendon Reflexes: Reflexes normal.  Psychiatric:        Mood and Affect: Mood normal.           Assessment & Plan:  Marland KitchenMarland KitchenShelly Raga was seen today for follow-up.  Diagnoses and all orders for  this visit:  TIA (transient ischemic attack)  Trouble in sleeping -     traZODone (DESYREL) 50 MG tablet; Take 1 tablet (50 mg total) by mouth at bedtime.  Uncontrolled hypertension -     telmisartan-hydrochlorothiazide (MICARDIS HCT) 80-25 MG tablet; Take 1 tablet by mouth daily. -     amLODipine (NORVASC) 10 MG tablet; Take 1 tablet (10 mg total) by mouth daily. -     CT Head Wo Contrast  COPD, moderate (HCC) -     albuterol (VENTOLIN HFA) 108 (90 Base) MCG/ACT inhaler; Inhale 2 puffs into the lungs every 6 (six) hours as needed.  PVD (peripheral vascular disease) with claudication (HCC) -     Lipid Panel w/reflex Direct LDL  Medication management -     COMPLETE METABOLIC PANEL WITH GFR  Dysphagia, unspecified type -     TSH -     CBC -     CT Head Wo Contrast  Dizziness -     COMPLETE METABOLIC PANEL WITH GFR -     TSH -     CBC -     CT Head Wo Contrast  Slurred speech -     TSH -     CBC -     CT Head Wo Contrast  Mixed hyperlipidemia  Anxiety -     ALPRAZolam (XANAX) 0.5 MG tablet; Take 1 tablet (0.5 mg total) by mouth 2 (two) times daily as needed for anxiety.  Seasonal allergies -     levocetirizine (XYZAL) 5 MG tablet; Take 1 tablet (5 mg total) by mouth daily.   Patient is very high risk for a stroke.  I do not see any convincing physical exam signs that show neurological defects.  However patient did report several things that makes me suspicious of a TIA.  Discussed this finding with patient.  I did go ahead and get a stat CT which was negative.  We discussed risk prevention with patient.  I ordered fasting lab so that I could better adjust his cholesterol to goal.  Discussed the importance of taking all of his medication.  His hypertension today is a very high risk factor for cardiovascular events.  Patient is also a smoker.  Strongly encouraged him to stop smoking.  Discussed if he stop smoking, control blood pressure, get LDL to goal, stay on statin, avoid  diabetes we can best decrease his risk of a stroke or heart attack.  Discussed the signs of stroke with patient and wife.  If he were to have any worsening symptoms he should go to the emergency room.  Order carotid u/s.   He must start all medications and take daily.  COPD- sample of symbicort 2 puffs bid. Albuterol as needed. Follow up in 1 month.   Will call with labs if symptoms have improved today but if persist need to consider referrals.   Pt is not sleeping well will try trazodone. Discussed side effects.    Marland Kitchen.Spent 40 minutes with patient and greater than 50 percent of visit spent counseling patient regarding treatment plan.

## 2019-02-22 NOTE — Progress Notes (Signed)
Pt was called with results. Normal CT. FYI>

## 2019-02-23 DIAGNOSIS — Z789 Other specified health status: Secondary | ICD-10-CM | POA: Diagnosis not present

## 2019-02-23 DIAGNOSIS — Z79899 Other long term (current) drug therapy: Secondary | ICD-10-CM | POA: Diagnosis not present

## 2019-02-23 DIAGNOSIS — R4781 Slurred speech: Secondary | ICD-10-CM | POA: Diagnosis not present

## 2019-02-23 DIAGNOSIS — R1310 Dysphagia, unspecified: Secondary | ICD-10-CM | POA: Diagnosis not present

## 2019-02-23 DIAGNOSIS — I739 Peripheral vascular disease, unspecified: Secondary | ICD-10-CM | POA: Diagnosis not present

## 2019-02-23 DIAGNOSIS — R471 Dysarthria and anarthria: Secondary | ICD-10-CM | POA: Diagnosis not present

## 2019-02-23 DIAGNOSIS — R531 Weakness: Secondary | ICD-10-CM | POA: Diagnosis not present

## 2019-02-23 DIAGNOSIS — R2681 Unsteadiness on feet: Secondary | ICD-10-CM | POA: Diagnosis not present

## 2019-02-23 DIAGNOSIS — R41841 Cognitive communication deficit: Secondary | ICD-10-CM | POA: Diagnosis not present

## 2019-02-23 DIAGNOSIS — I6350 Cerebral infarction due to unspecified occlusion or stenosis of unspecified cerebral artery: Secondary | ICD-10-CM | POA: Diagnosis not present

## 2019-02-23 DIAGNOSIS — Z7409 Other reduced mobility: Secondary | ICD-10-CM | POA: Diagnosis not present

## 2019-02-23 DIAGNOSIS — R42 Dizziness and giddiness: Secondary | ICD-10-CM | POA: Diagnosis not present

## 2019-02-24 ENCOUNTER — Encounter: Payer: Self-pay | Admitting: Physician Assistant

## 2019-02-24 ENCOUNTER — Telehealth: Payer: Self-pay | Admitting: Neurology

## 2019-02-24 DIAGNOSIS — I6603 Occlusion and stenosis of bilateral middle cerebral arteries: Secondary | ICD-10-CM | POA: Diagnosis not present

## 2019-02-24 DIAGNOSIS — R2681 Unsteadiness on feet: Secondary | ICD-10-CM | POA: Diagnosis not present

## 2019-02-24 DIAGNOSIS — D696 Thrombocytopenia, unspecified: Secondary | ICD-10-CM | POA: Diagnosis not present

## 2019-02-24 DIAGNOSIS — R109 Unspecified abdominal pain: Secondary | ICD-10-CM | POA: Diagnosis not present

## 2019-02-24 DIAGNOSIS — R4781 Slurred speech: Secondary | ICD-10-CM | POA: Diagnosis not present

## 2019-02-24 DIAGNOSIS — I700 Atherosclerosis of aorta: Secondary | ICD-10-CM | POA: Diagnosis not present

## 2019-02-24 DIAGNOSIS — M47812 Spondylosis without myelopathy or radiculopathy, cervical region: Secondary | ICD-10-CM | POA: Diagnosis not present

## 2019-02-24 DIAGNOSIS — R42 Dizziness and giddiness: Secondary | ICD-10-CM | POA: Insufficient documentation

## 2019-02-24 DIAGNOSIS — R739 Hyperglycemia, unspecified: Secondary | ICD-10-CM | POA: Diagnosis not present

## 2019-02-24 DIAGNOSIS — J3489 Other specified disorders of nose and nasal sinuses: Secondary | ICD-10-CM | POA: Diagnosis not present

## 2019-02-24 DIAGNOSIS — R1310 Dysphagia, unspecified: Secondary | ICD-10-CM | POA: Insufficient documentation

## 2019-02-24 DIAGNOSIS — D6959 Other secondary thrombocytopenia: Secondary | ICD-10-CM | POA: Diagnosis not present

## 2019-02-24 DIAGNOSIS — I080 Rheumatic disorders of both mitral and aortic valves: Secondary | ICD-10-CM | POA: Diagnosis not present

## 2019-02-24 DIAGNOSIS — I6503 Occlusion and stenosis of bilateral vertebral arteries: Secondary | ICD-10-CM | POA: Diagnosis not present

## 2019-02-24 DIAGNOSIS — G459 Transient cerebral ischemic attack, unspecified: Secondary | ICD-10-CM | POA: Insufficient documentation

## 2019-02-24 DIAGNOSIS — R41841 Cognitive communication deficit: Secondary | ICD-10-CM | POA: Diagnosis not present

## 2019-02-24 DIAGNOSIS — I6522 Occlusion and stenosis of left carotid artery: Secondary | ICD-10-CM | POA: Diagnosis not present

## 2019-02-24 DIAGNOSIS — E162 Hypoglycemia, unspecified: Secondary | ICD-10-CM | POA: Diagnosis not present

## 2019-02-24 DIAGNOSIS — I10 Essential (primary) hypertension: Secondary | ICD-10-CM | POA: Diagnosis not present

## 2019-02-24 DIAGNOSIS — R531 Weakness: Secondary | ICD-10-CM | POA: Diagnosis not present

## 2019-02-24 DIAGNOSIS — E785 Hyperlipidemia, unspecified: Secondary | ICD-10-CM | POA: Diagnosis not present

## 2019-02-24 DIAGNOSIS — G8194 Hemiplegia, unspecified affecting left nondominant side: Secondary | ICD-10-CM | POA: Diagnosis not present

## 2019-02-24 DIAGNOSIS — R471 Dysarthria and anarthria: Secondary | ICD-10-CM | POA: Diagnosis not present

## 2019-02-24 DIAGNOSIS — Z7409 Other reduced mobility: Secondary | ICD-10-CM | POA: Diagnosis not present

## 2019-02-24 DIAGNOSIS — R69 Illness, unspecified: Secondary | ICD-10-CM | POA: Diagnosis not present

## 2019-02-24 DIAGNOSIS — J439 Emphysema, unspecified: Secondary | ICD-10-CM | POA: Diagnosis not present

## 2019-02-24 DIAGNOSIS — D573 Sickle-cell trait: Secondary | ICD-10-CM | POA: Diagnosis not present

## 2019-02-24 DIAGNOSIS — I6329 Cerebral infarction due to unspecified occlusion or stenosis of other precerebral arteries: Secondary | ICD-10-CM | POA: Diagnosis not present

## 2019-02-24 DIAGNOSIS — Z789 Other specified health status: Secondary | ICD-10-CM | POA: Diagnosis not present

## 2019-02-24 DIAGNOSIS — I6350 Cerebral infarction due to unspecified occlusion or stenosis of unspecified cerebral artery: Secondary | ICD-10-CM | POA: Diagnosis not present

## 2019-02-24 DIAGNOSIS — I639 Cerebral infarction, unspecified: Secondary | ICD-10-CM | POA: Diagnosis not present

## 2019-02-24 DIAGNOSIS — I739 Peripheral vascular disease, unspecified: Secondary | ICD-10-CM | POA: Diagnosis not present

## 2019-02-24 LAB — COMPLETE METABOLIC PANEL WITH GFR
AG Ratio: 1.4 (calc) (ref 1.0–2.5)
ALT: 17 U/L (ref 9–46)
AST: 17 U/L (ref 10–35)
Albumin: 3.9 g/dL (ref 3.6–5.1)
Alkaline phosphatase (APISO): 67 U/L (ref 35–144)
BUN: 11 mg/dL (ref 7–25)
CO2: 30 mmol/L (ref 20–32)
Calcium: 9 mg/dL (ref 8.6–10.3)
Chloride: 103 mmol/L (ref 98–110)
Creat: 0.93 mg/dL (ref 0.70–1.25)
GFR, Est African American: 98 mL/min/{1.73_m2} (ref >=60)
GFR, Est Non African American: 85 mL/min/{1.73_m2} (ref >=60)
Globulin: 2.8 g/dL (calc) (ref 1.9–3.7)
Glucose, Bld: 87 mg/dL (ref 65–99)
Potassium: 3.8 mmol/L (ref 3.5–5.3)
Sodium: 140 mmol/L (ref 135–146)
Total Bilirubin: 0.7 mg/dL (ref 0.2–1.2)
Total Protein: 6.7 g/dL (ref 6.1–8.1)

## 2019-02-24 LAB — LIPID PANEL W/REFLEX DIRECT LDL
Cholesterol: 200 mg/dL — ABNORMAL HIGH (ref <200)
HDL: 41 mg/dL (ref >=40)
LDL Cholesterol (Calc): 132 mg/dL (calc) — ABNORMAL HIGH
Non-HDL Cholesterol (Calc): 159 mg/dL (calc) — ABNORMAL HIGH (ref <130)
Total CHOL/HDL Ratio: 4.9 (calc) (ref <5.0)
Triglycerides: 152 mg/dL — ABNORMAL HIGH (ref <150)

## 2019-02-24 LAB — CBC
HCT: 44.2 % (ref 38.5–50.0)
Hemoglobin: 14.2 g/dL (ref 13.2–17.1)
MCH: 27.2 pg (ref 27.0–33.0)
MCHC: 32.1 g/dL (ref 32.0–36.0)
MCV: 84.7 fL (ref 80.0–100.0)
Platelets: 113 10*3/uL — ABNORMAL LOW (ref 140–400)
RBC: 5.22 10*6/uL (ref 4.20–5.80)
RDW: 14 % (ref 11.0–15.0)
WBC: 8.6 10*3/uL (ref 3.8–10.8)

## 2019-02-24 LAB — TSH: TSH: 1.97 mIU/L (ref 0.40–4.50)

## 2019-02-24 NOTE — Progress Notes (Signed)
Call pt: cholesterol has worsened a bit. LDL 132. I would like to try crestor to replace pravachol it has better efficacy.   Kidney, liver, glucose, thyroid look great.   Your platelets are really low. You have a hx of this but lower than normal. If you are taking in ASA stop it. I want to recheck Monday or Tuesday next week CBC/diff/platelets. If you have numerous bruises starting to pop up or start feeling really weak then go to ED or let us know. Confirm no black tarry stools?

## 2019-02-24 NOTE — Telephone Encounter (Signed)
Patient's wife called and left vm stating patient is hospitalized currently at [facility]. 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 He is not able to move the left side of his body, they are going to set him up with therapies. She wanted to keep you updated.

## 2019-02-24 NOTE — Telephone Encounter (Signed)
Pt wife called and discussed plan of care.

## 2019-02-25 DIAGNOSIS — E162 Hypoglycemia, unspecified: Secondary | ICD-10-CM | POA: Diagnosis not present

## 2019-02-25 DIAGNOSIS — D573 Sickle-cell trait: Secondary | ICD-10-CM | POA: Diagnosis not present

## 2019-02-25 DIAGNOSIS — I739 Peripheral vascular disease, unspecified: Secondary | ICD-10-CM | POA: Diagnosis not present

## 2019-02-25 DIAGNOSIS — R42 Dizziness and giddiness: Secondary | ICD-10-CM | POA: Diagnosis not present

## 2019-02-25 DIAGNOSIS — I6522 Occlusion and stenosis of left carotid artery: Secondary | ICD-10-CM | POA: Diagnosis not present

## 2019-02-25 DIAGNOSIS — E785 Hyperlipidemia, unspecified: Secondary | ICD-10-CM | POA: Diagnosis not present

## 2019-02-25 DIAGNOSIS — I10 Essential (primary) hypertension: Secondary | ICD-10-CM | POA: Diagnosis not present

## 2019-02-25 DIAGNOSIS — R69 Illness, unspecified: Secondary | ICD-10-CM | POA: Diagnosis not present

## 2019-02-25 DIAGNOSIS — I639 Cerebral infarction, unspecified: Secondary | ICD-10-CM | POA: Diagnosis not present

## 2019-02-25 DIAGNOSIS — R739 Hyperglycemia, unspecified: Secondary | ICD-10-CM | POA: Diagnosis not present

## 2019-02-25 DIAGNOSIS — G8194 Hemiplegia, unspecified affecting left nondominant side: Secondary | ICD-10-CM | POA: Diagnosis not present

## 2019-02-25 DIAGNOSIS — D696 Thrombocytopenia, unspecified: Secondary | ICD-10-CM | POA: Diagnosis not present

## 2019-02-25 DIAGNOSIS — D6959 Other secondary thrombocytopenia: Secondary | ICD-10-CM | POA: Diagnosis not present

## 2019-02-25 DIAGNOSIS — R109 Unspecified abdominal pain: Secondary | ICD-10-CM | POA: Diagnosis not present

## 2019-03-07 ENCOUNTER — Telehealth: Payer: Self-pay | Admitting: Neurology

## 2019-03-07 DIAGNOSIS — E785 Hyperlipidemia, unspecified: Secondary | ICD-10-CM | POA: Diagnosis not present

## 2019-03-07 DIAGNOSIS — I10 Essential (primary) hypertension: Secondary | ICD-10-CM | POA: Diagnosis not present

## 2019-03-07 DIAGNOSIS — N400 Enlarged prostate without lower urinary tract symptoms: Secondary | ICD-10-CM | POA: Diagnosis not present

## 2019-03-07 DIAGNOSIS — D696 Thrombocytopenia, unspecified: Secondary | ICD-10-CM | POA: Diagnosis not present

## 2019-03-07 DIAGNOSIS — K219 Gastro-esophageal reflux disease without esophagitis: Secondary | ICD-10-CM | POA: Diagnosis not present

## 2019-03-07 DIAGNOSIS — J449 Chronic obstructive pulmonary disease, unspecified: Secondary | ICD-10-CM | POA: Diagnosis not present

## 2019-03-07 DIAGNOSIS — I739 Peripheral vascular disease, unspecified: Secondary | ICD-10-CM | POA: Diagnosis not present

## 2019-03-07 DIAGNOSIS — I69328 Other speech and language deficits following cerebral infarction: Secondary | ICD-10-CM | POA: Diagnosis not present

## 2019-03-07 DIAGNOSIS — I69354 Hemiplegia and hemiparesis following cerebral infarction affecting left non-dominant side: Secondary | ICD-10-CM | POA: Diagnosis not present

## 2019-03-07 DIAGNOSIS — I69322 Dysarthria following cerebral infarction: Secondary | ICD-10-CM | POA: Diagnosis not present

## 2019-03-07 NOTE — Telephone Encounter (Signed)
Perfect

## 2019-03-07 NOTE — Telephone Encounter (Addendum)
Remo Lipps, physical therapist with [facility] [phone]), called for verbal orders to see patient twice a week following hospitalization for a stroke. Verbal orders given. OT/ST will also being seeing patient. They will fax orders to sign.  [place].

## 2019-03-09 DIAGNOSIS — K219 Gastro-esophageal reflux disease without esophagitis: Secondary | ICD-10-CM | POA: Diagnosis not present

## 2019-03-09 DIAGNOSIS — I10 Essential (primary) hypertension: Secondary | ICD-10-CM | POA: Diagnosis not present

## 2019-03-09 DIAGNOSIS — I69354 Hemiplegia and hemiparesis following cerebral infarction affecting left non-dominant side: Secondary | ICD-10-CM | POA: Diagnosis not present

## 2019-03-09 DIAGNOSIS — I739 Peripheral vascular disease, unspecified: Secondary | ICD-10-CM | POA: Diagnosis not present

## 2019-03-09 DIAGNOSIS — N400 Enlarged prostate without lower urinary tract symptoms: Secondary | ICD-10-CM | POA: Diagnosis not present

## 2019-03-09 DIAGNOSIS — I69328 Other speech and language deficits following cerebral infarction: Secondary | ICD-10-CM | POA: Diagnosis not present

## 2019-03-09 DIAGNOSIS — I69322 Dysarthria following cerebral infarction: Secondary | ICD-10-CM | POA: Diagnosis not present

## 2019-03-09 DIAGNOSIS — E785 Hyperlipidemia, unspecified: Secondary | ICD-10-CM | POA: Diagnosis not present

## 2019-03-09 DIAGNOSIS — J449 Chronic obstructive pulmonary disease, unspecified: Secondary | ICD-10-CM | POA: Diagnosis not present

## 2019-03-09 DIAGNOSIS — D696 Thrombocytopenia, unspecified: Secondary | ICD-10-CM | POA: Diagnosis not present

## 2019-03-10 DIAGNOSIS — I69322 Dysarthria following cerebral infarction: Secondary | ICD-10-CM | POA: Diagnosis not present

## 2019-03-10 DIAGNOSIS — D696 Thrombocytopenia, unspecified: Secondary | ICD-10-CM | POA: Diagnosis not present

## 2019-03-10 DIAGNOSIS — I739 Peripheral vascular disease, unspecified: Secondary | ICD-10-CM | POA: Diagnosis not present

## 2019-03-10 DIAGNOSIS — I69354 Hemiplegia and hemiparesis following cerebral infarction affecting left non-dominant side: Secondary | ICD-10-CM | POA: Diagnosis not present

## 2019-03-10 DIAGNOSIS — N400 Enlarged prostate without lower urinary tract symptoms: Secondary | ICD-10-CM | POA: Diagnosis not present

## 2019-03-10 DIAGNOSIS — E785 Hyperlipidemia, unspecified: Secondary | ICD-10-CM | POA: Diagnosis not present

## 2019-03-10 DIAGNOSIS — J449 Chronic obstructive pulmonary disease, unspecified: Secondary | ICD-10-CM | POA: Diagnosis not present

## 2019-03-10 DIAGNOSIS — K219 Gastro-esophageal reflux disease without esophagitis: Secondary | ICD-10-CM | POA: Diagnosis not present

## 2019-03-10 DIAGNOSIS — I69328 Other speech and language deficits following cerebral infarction: Secondary | ICD-10-CM | POA: Diagnosis not present

## 2019-03-10 DIAGNOSIS — I10 Essential (primary) hypertension: Secondary | ICD-10-CM | POA: Diagnosis not present

## 2019-03-12 DIAGNOSIS — R0602 Shortness of breath: Secondary | ICD-10-CM | POA: Diagnosis not present

## 2019-03-12 DIAGNOSIS — Z87891 Personal history of nicotine dependence: Secondary | ICD-10-CM | POA: Diagnosis not present

## 2019-03-12 DIAGNOSIS — R0600 Dyspnea, unspecified: Secondary | ICD-10-CM | POA: Diagnosis not present

## 2019-03-12 DIAGNOSIS — R062 Wheezing: Secondary | ICD-10-CM | POA: Diagnosis not present

## 2019-03-12 DIAGNOSIS — J441 Chronic obstructive pulmonary disease with (acute) exacerbation: Secondary | ICD-10-CM | POA: Diagnosis not present

## 2019-03-13 DIAGNOSIS — J449 Chronic obstructive pulmonary disease, unspecified: Secondary | ICD-10-CM | POA: Diagnosis not present

## 2019-03-13 DIAGNOSIS — I739 Peripheral vascular disease, unspecified: Secondary | ICD-10-CM | POA: Diagnosis not present

## 2019-03-13 DIAGNOSIS — K219 Gastro-esophageal reflux disease without esophagitis: Secondary | ICD-10-CM | POA: Diagnosis not present

## 2019-03-13 DIAGNOSIS — D696 Thrombocytopenia, unspecified: Secondary | ICD-10-CM | POA: Diagnosis not present

## 2019-03-13 DIAGNOSIS — I69354 Hemiplegia and hemiparesis following cerebral infarction affecting left non-dominant side: Secondary | ICD-10-CM | POA: Diagnosis not present

## 2019-03-13 DIAGNOSIS — N400 Enlarged prostate without lower urinary tract symptoms: Secondary | ICD-10-CM | POA: Diagnosis not present

## 2019-03-13 DIAGNOSIS — E785 Hyperlipidemia, unspecified: Secondary | ICD-10-CM | POA: Diagnosis not present

## 2019-03-13 DIAGNOSIS — I69328 Other speech and language deficits following cerebral infarction: Secondary | ICD-10-CM | POA: Diagnosis not present

## 2019-03-13 DIAGNOSIS — I69322 Dysarthria following cerebral infarction: Secondary | ICD-10-CM | POA: Diagnosis not present

## 2019-03-13 DIAGNOSIS — I10 Essential (primary) hypertension: Secondary | ICD-10-CM | POA: Diagnosis not present

## 2019-03-13 DIAGNOSIS — R0602 Shortness of breath: Secondary | ICD-10-CM | POA: Diagnosis not present

## 2019-03-15 ENCOUNTER — Telehealth: Payer: Self-pay

## 2019-03-15 DIAGNOSIS — E785 Hyperlipidemia, unspecified: Secondary | ICD-10-CM | POA: Diagnosis not present

## 2019-03-15 DIAGNOSIS — I69322 Dysarthria following cerebral infarction: Secondary | ICD-10-CM | POA: Diagnosis not present

## 2019-03-15 DIAGNOSIS — J449 Chronic obstructive pulmonary disease, unspecified: Secondary | ICD-10-CM | POA: Diagnosis not present

## 2019-03-15 DIAGNOSIS — I69354 Hemiplegia and hemiparesis following cerebral infarction affecting left non-dominant side: Secondary | ICD-10-CM | POA: Diagnosis not present

## 2019-03-15 DIAGNOSIS — I739 Peripheral vascular disease, unspecified: Secondary | ICD-10-CM | POA: Diagnosis not present

## 2019-03-15 DIAGNOSIS — I69328 Other speech and language deficits following cerebral infarction: Secondary | ICD-10-CM | POA: Diagnosis not present

## 2019-03-15 DIAGNOSIS — N400 Enlarged prostate without lower urinary tract symptoms: Secondary | ICD-10-CM | POA: Diagnosis not present

## 2019-03-15 DIAGNOSIS — D696 Thrombocytopenia, unspecified: Secondary | ICD-10-CM | POA: Diagnosis not present

## 2019-03-15 DIAGNOSIS — I10 Essential (primary) hypertension: Secondary | ICD-10-CM | POA: Diagnosis not present

## 2019-03-15 DIAGNOSIS — K219 Gastro-esophageal reflux disease without esophagitis: Secondary | ICD-10-CM | POA: Diagnosis not present

## 2019-03-15 NOTE — Telephone Encounter (Signed)
Verbal orders given for Speech Therapy for home health.

## 2019-03-15 NOTE — Telephone Encounter (Signed)
Perfect. Thanks.

## 2019-03-15 NOTE — Telephone Encounter (Signed)
I received same phone call from [place] with [facility] and gave verbal orders for patient to have ST once weekly. [phone].

## 2019-03-16 ENCOUNTER — Telehealth: Payer: Self-pay

## 2019-03-16 MED ORDER — HYDRALAZINE HCL 10 MG PO TABS: 10.0000 mg | ORAL_TABLET | Freq: Three times a day (TID) | ORAL | 0 refills | Status: DC

## 2019-03-16 NOTE — Telephone Encounter (Signed)
Left message for a return call

## 2019-03-16 NOTE — Telephone Encounter (Signed)
Home health called and states Travis Barajas's blood pressure has been elevated the last two times. 170/80   I called patient and left a message for a return call.

## 2019-03-16 NOTE — Telephone Encounter (Signed)
Confirm pt is taking  Norvasc 10mg  Micardias/HCTZ 80/25mg  Lopressor 100mg  bid Doxazosin 4mg    If so I send hydralazine 10mg  TID to pharmacy. Please continue to report BP readings.

## 2019-03-20 DIAGNOSIS — E785 Hyperlipidemia, unspecified: Secondary | ICD-10-CM | POA: Diagnosis not present

## 2019-03-20 DIAGNOSIS — I69322 Dysarthria following cerebral infarction: Secondary | ICD-10-CM | POA: Diagnosis not present

## 2019-03-20 DIAGNOSIS — N400 Enlarged prostate without lower urinary tract symptoms: Secondary | ICD-10-CM | POA: Diagnosis not present

## 2019-03-20 DIAGNOSIS — K219 Gastro-esophageal reflux disease without esophagitis: Secondary | ICD-10-CM | POA: Diagnosis not present

## 2019-03-20 DIAGNOSIS — I69354 Hemiplegia and hemiparesis following cerebral infarction affecting left non-dominant side: Secondary | ICD-10-CM | POA: Diagnosis not present

## 2019-03-20 DIAGNOSIS — I639 Cerebral infarction, unspecified: Secondary | ICD-10-CM | POA: Diagnosis not present

## 2019-03-20 DIAGNOSIS — D696 Thrombocytopenia, unspecified: Secondary | ICD-10-CM | POA: Diagnosis not present

## 2019-03-20 DIAGNOSIS — I739 Peripheral vascular disease, unspecified: Secondary | ICD-10-CM | POA: Diagnosis not present

## 2019-03-20 DIAGNOSIS — I10 Essential (primary) hypertension: Secondary | ICD-10-CM | POA: Diagnosis not present

## 2019-03-20 DIAGNOSIS — I69328 Other speech and language deficits following cerebral infarction: Secondary | ICD-10-CM | POA: Diagnosis not present

## 2019-03-20 DIAGNOSIS — J449 Chronic obstructive pulmonary disease, unspecified: Secondary | ICD-10-CM | POA: Diagnosis not present

## 2019-03-22 ENCOUNTER — Telehealth: Payer: Self-pay

## 2019-03-22 NOTE — Telephone Encounter (Signed)
Travis Barajas, OT with [facility] called requesting verbal orders 1 week 1 and 2 week 6. Working on neuromuscular function to return to left upper extremity as well as coordination and ADLs.   Verbal given. FYI to PCP

## 2019-03-23 DIAGNOSIS — I69354 Hemiplegia and hemiparesis following cerebral infarction affecting left non-dominant side: Secondary | ICD-10-CM | POA: Diagnosis not present

## 2019-03-23 DIAGNOSIS — I10 Essential (primary) hypertension: Secondary | ICD-10-CM | POA: Diagnosis not present

## 2019-03-23 DIAGNOSIS — J449 Chronic obstructive pulmonary disease, unspecified: Secondary | ICD-10-CM | POA: Diagnosis not present

## 2019-03-23 DIAGNOSIS — I69328 Other speech and language deficits following cerebral infarction: Secondary | ICD-10-CM | POA: Diagnosis not present

## 2019-03-23 DIAGNOSIS — I69322 Dysarthria following cerebral infarction: Secondary | ICD-10-CM | POA: Diagnosis not present

## 2019-03-23 DIAGNOSIS — K219 Gastro-esophageal reflux disease without esophagitis: Secondary | ICD-10-CM | POA: Diagnosis not present

## 2019-03-23 DIAGNOSIS — I739 Peripheral vascular disease, unspecified: Secondary | ICD-10-CM | POA: Diagnosis not present

## 2019-03-23 DIAGNOSIS — N400 Enlarged prostate without lower urinary tract symptoms: Secondary | ICD-10-CM | POA: Diagnosis not present

## 2019-03-23 DIAGNOSIS — E785 Hyperlipidemia, unspecified: Secondary | ICD-10-CM | POA: Diagnosis not present

## 2019-03-23 DIAGNOSIS — D696 Thrombocytopenia, unspecified: Secondary | ICD-10-CM | POA: Diagnosis not present

## 2019-03-24 DIAGNOSIS — D696 Thrombocytopenia, unspecified: Secondary | ICD-10-CM | POA: Diagnosis not present

## 2019-03-24 DIAGNOSIS — I739 Peripheral vascular disease, unspecified: Secondary | ICD-10-CM | POA: Diagnosis not present

## 2019-03-24 DIAGNOSIS — N400 Enlarged prostate without lower urinary tract symptoms: Secondary | ICD-10-CM | POA: Diagnosis not present

## 2019-03-24 DIAGNOSIS — I69322 Dysarthria following cerebral infarction: Secondary | ICD-10-CM | POA: Diagnosis not present

## 2019-03-24 DIAGNOSIS — J449 Chronic obstructive pulmonary disease, unspecified: Secondary | ICD-10-CM | POA: Diagnosis not present

## 2019-03-24 DIAGNOSIS — I69354 Hemiplegia and hemiparesis following cerebral infarction affecting left non-dominant side: Secondary | ICD-10-CM | POA: Diagnosis not present

## 2019-03-24 DIAGNOSIS — E785 Hyperlipidemia, unspecified: Secondary | ICD-10-CM | POA: Diagnosis not present

## 2019-03-24 DIAGNOSIS — I69328 Other speech and language deficits following cerebral infarction: Secondary | ICD-10-CM | POA: Diagnosis not present

## 2019-03-24 DIAGNOSIS — K219 Gastro-esophageal reflux disease without esophagitis: Secondary | ICD-10-CM | POA: Diagnosis not present

## 2019-03-24 DIAGNOSIS — I10 Essential (primary) hypertension: Secondary | ICD-10-CM | POA: Diagnosis not present

## 2019-03-28 DIAGNOSIS — I739 Peripheral vascular disease, unspecified: Secondary | ICD-10-CM | POA: Diagnosis not present

## 2019-03-28 DIAGNOSIS — J449 Chronic obstructive pulmonary disease, unspecified: Secondary | ICD-10-CM | POA: Diagnosis not present

## 2019-03-28 DIAGNOSIS — I69354 Hemiplegia and hemiparesis following cerebral infarction affecting left non-dominant side: Secondary | ICD-10-CM | POA: Diagnosis not present

## 2019-03-28 DIAGNOSIS — E785 Hyperlipidemia, unspecified: Secondary | ICD-10-CM | POA: Diagnosis not present

## 2019-03-28 DIAGNOSIS — I69328 Other speech and language deficits following cerebral infarction: Secondary | ICD-10-CM | POA: Diagnosis not present

## 2019-03-28 DIAGNOSIS — I69322 Dysarthria following cerebral infarction: Secondary | ICD-10-CM | POA: Diagnosis not present

## 2019-03-28 DIAGNOSIS — K219 Gastro-esophageal reflux disease without esophagitis: Secondary | ICD-10-CM | POA: Diagnosis not present

## 2019-03-28 DIAGNOSIS — D696 Thrombocytopenia, unspecified: Secondary | ICD-10-CM | POA: Diagnosis not present

## 2019-03-28 DIAGNOSIS — N400 Enlarged prostate without lower urinary tract symptoms: Secondary | ICD-10-CM | POA: Diagnosis not present

## 2019-03-28 DIAGNOSIS — I10 Essential (primary) hypertension: Secondary | ICD-10-CM | POA: Diagnosis not present

## 2019-03-30 DIAGNOSIS — N400 Enlarged prostate without lower urinary tract symptoms: Secondary | ICD-10-CM | POA: Diagnosis not present

## 2019-03-30 DIAGNOSIS — K219 Gastro-esophageal reflux disease without esophagitis: Secondary | ICD-10-CM | POA: Diagnosis not present

## 2019-03-30 DIAGNOSIS — E785 Hyperlipidemia, unspecified: Secondary | ICD-10-CM | POA: Diagnosis not present

## 2019-03-30 DIAGNOSIS — I739 Peripheral vascular disease, unspecified: Secondary | ICD-10-CM | POA: Diagnosis not present

## 2019-03-30 DIAGNOSIS — D696 Thrombocytopenia, unspecified: Secondary | ICD-10-CM | POA: Diagnosis not present

## 2019-03-30 DIAGNOSIS — J449 Chronic obstructive pulmonary disease, unspecified: Secondary | ICD-10-CM | POA: Diagnosis not present

## 2019-03-30 DIAGNOSIS — I69354 Hemiplegia and hemiparesis following cerebral infarction affecting left non-dominant side: Secondary | ICD-10-CM | POA: Diagnosis not present

## 2019-03-30 DIAGNOSIS — I10 Essential (primary) hypertension: Secondary | ICD-10-CM | POA: Diagnosis not present

## 2019-03-30 DIAGNOSIS — I69328 Other speech and language deficits following cerebral infarction: Secondary | ICD-10-CM | POA: Diagnosis not present

## 2019-03-30 DIAGNOSIS — I69322 Dysarthria following cerebral infarction: Secondary | ICD-10-CM | POA: Diagnosis not present

## 2019-03-31 ENCOUNTER — Ambulatory Visit (INDEPENDENT_AMBULATORY_CARE_PROVIDER_SITE_OTHER): Payer: Medicare HMO

## 2019-03-31 ENCOUNTER — Encounter: Payer: Self-pay | Admitting: Physician Assistant

## 2019-03-31 ENCOUNTER — Other Ambulatory Visit: Payer: Self-pay

## 2019-03-31 ENCOUNTER — Ambulatory Visit (INDEPENDENT_AMBULATORY_CARE_PROVIDER_SITE_OTHER): Payer: Medicare HMO | Admitting: Physician Assistant

## 2019-03-31 VITALS — BP 156/66 | HR 79 | Ht 63.5 in | Wt 188.0 lb

## 2019-03-31 DIAGNOSIS — R635 Abnormal weight gain: Secondary | ICD-10-CM | POA: Diagnosis not present

## 2019-03-31 DIAGNOSIS — R0689 Other abnormalities of breathing: Secondary | ICD-10-CM | POA: Insufficient documentation

## 2019-03-31 DIAGNOSIS — R062 Wheezing: Secondary | ICD-10-CM

## 2019-03-31 DIAGNOSIS — I6339 Cerebral infarction due to thrombosis of other cerebral artery: Secondary | ICD-10-CM | POA: Diagnosis not present

## 2019-03-31 DIAGNOSIS — J449 Chronic obstructive pulmonary disease, unspecified: Secondary | ICD-10-CM

## 2019-03-31 DIAGNOSIS — R0602 Shortness of breath: Secondary | ICD-10-CM

## 2019-03-31 DIAGNOSIS — R600 Localized edema: Secondary | ICD-10-CM | POA: Diagnosis not present

## 2019-03-31 DIAGNOSIS — J189 Pneumonia, unspecified organism: Secondary | ICD-10-CM | POA: Diagnosis not present

## 2019-03-31 DIAGNOSIS — R69 Illness, unspecified: Secondary | ICD-10-CM | POA: Diagnosis not present

## 2019-03-31 MED ORDER — METHYLPREDNISOLONE SODIUM SUCC 125 MG IJ SOLR
125.0000 mg | Freq: Once | INTRAMUSCULAR | Status: AC
  Administered 2019-03-31: 125 mg via INTRAMUSCULAR

## 2019-03-31 MED ORDER — PREDNISONE 20 MG PO TABS: ORAL_TABLET | ORAL | 0 refills | Status: DC

## 2019-03-31 MED ORDER — FUROSEMIDE 20 MG PO TABS: 20.0000 mg | ORAL_TABLET | Freq: Every day | ORAL | 2 refills | Status: DC

## 2019-03-31 MED ORDER — AMBULATORY NON FORMULARY MEDICATION: 0 refills | Status: DC

## 2019-03-31 MED ORDER — IPRATROPIUM-ALBUTEROL 0.5-2.5 (3) MG/3ML IN SOLN: 3.0000 mL | RESPIRATORY_TRACT | 0 refills | Status: DC | PRN

## 2019-03-31 MED ORDER — AZITHROMYCIN 250 MG PO TABS: ORAL_TABLET | ORAL | 0 refills | Status: DC

## 2019-03-31 NOTE — Progress Notes (Signed)
Subjective:    Patient ID: Travis Barajas, male    DOB: [birth year], 68 y.o.   MRN: [id number]  HPI  Pt is a 68 yo male with recent CVA in september, HTN, COPD who presents to the clinic for hospital follow up. Pt is accompanied by wife.   MRI reading from CVA.  1. Acute brainstem infarct at the paracentral pontomedullary junction. No associated hemorrhage or mass effect. 2. Otherwise largely normal for age noncontrast MRI appearance of the brain.  They are both today more concerned with wheezing and SOB that has been ongoing since middle of September. He went to ED on 03/12/19 and given some albuterol nebulizer treatment and prednisone. CXR was negative for pneumonia. He continues to have trouble breathing. No fever, chills. He feels like he can't cough anything up. He is not on a daily inhaler.   Pt is not having significant lower extermity edema but scant noted.  Denies any orthopnea. He has gained 20lbs since April 2020.   .. Active Ambulatory Problems    Diagnosis Date Noted  . GERD (gastroesophageal reflux disease) 07/23/2011  . Allergic rhinitis 07/23/2011  . Joint pain 07/23/2011  . HTN (hypertension), malignant 10/01/2011  . Tobacco user 12/05/2011  . Dyspnea 10/18/2012  . Hematuria 11/07/2013  . Hyperlipidemia 11/17/2013  . BPH (benign prostatic hyperplasia) 11/21/2013  . COPD, moderate ([place]) 03/21/2014  . Erectile dysfunction 03/21/2014  . Claudication ([place]) 09/26/2014  . Generalized anxiety disorder 01/15/2015  . BPH without urinary obstruction 05/25/2016  . Acute nonseasonal allergic rhinitis due to pollen 05/04/2017  . Dyslipidemia 05/04/2017  . Current smoker 09/01/2017  . Hemoptysis 09/01/2017  . Left carotid bruit 09/01/2017  . Systolic murmur [id number]  . Trouble in sleeping 10/05/2018  . PVD (peripheral vascular disease) with claudication ([place]) 02/22/2019  . Thrombocytopenia ([place]) 02/24/2019  . Slurred speech 02/24/2019  . Dizziness 02/24/2019  .  Dysphagia 02/24/2019  . TIA (transient ischemic attack) 02/24/2019  . Intercostal retractions 03/31/2019  . Cerebrovascular accident ([place]) 04/03/2019  . Pneumonia of left lower lobe due to infectious organism 04/03/2019   Resolved Ambulatory Problems    Diagnosis Date Noted  . No Resolved Ambulatory Problems   Past Medical History:  Diagnosis Date  . Allergy   . Anxiety   . Arthritis   . COPD (chronic obstructive pulmonary disease) ([place])   . Hypertension               Review of Systems See HPI.     Objective:   Physical Exam Vitals signs reviewed.  Constitutional:      General: He is in acute distress.  HENT:     Head: Normocephalic.     Right Ear: Tympanic membrane normal.     Left Ear: Tympanic membrane normal.     Nose: Nose normal. No congestion.  Eyes:     Extraocular Movements: Extraocular movements intact.     Conjunctiva/sclera: Conjunctivae normal.     Pupils: Pupils are equal, round, and reactive to light.  Cardiovascular:     Rate and Rhythm: Normal rate.     Pulses: Normal pulses.     Heart sounds: Normal heart sounds.  Pulmonary:     Breath sounds: Wheezing and rhonchi present.     Comments: Pt is in some respiratory distress with increased respirations, wheezing, retractions noted on exam.  Abdominal:     General: There is distension.     Palpations: Abdomen is soft.     Tenderness: There  is no abdominal tenderness. There is no guarding or rebound.  Musculoskeletal:     Right lower leg: Edema present.     Left lower leg: Edema present.  Neurological:     Mental Status: He is alert and oriented to person, place, and time.     Motor: Weakness present.     Coordination: Coordination abnormal.     Gait: Gait abnormal.     Comments: Left side effected by stroke. Decreased strength, reflex's, sensation.   Psychiatric:        Mood and Affect: Mood normal.        Behavior: Behavior normal.           Assessment & Plan:  Marland KitchenMarland KitchenDiagnoses and all  orders for this visit:  Pneumonia of left lower lobe due to infectious organism -     predniSONE (DELTASONE) 20 MG tablet; Take 3 tablets for 3 days, take 2 tablets for 3 days, take 1 tablet for 3 days, take 1/2 tablet for 4 days. -     ipratropium-albuterol (DUONEB) 0.5-2.5 (3) MG/3ML SOLN; Take 3 mLs by nebulization every 2 (two) hours as needed. -     AMBULATORY NON FORMULARY MEDICATION; Nebulizer machine and supplies for wheezing/retractions/asthma exacerbations. -     azithromycin (ZITHROMAX) 250 MG tablet; Take 2 tablets now and then one tablet for 4 days.  SOB (shortness of breath) -     DG Chest 2 View -     methylPREDNISolone sodium succinate (SOLU-MEDROL) 125 mg/2 mL injection 125 mg  Weight gain  Lower extremity edema -     furosemide (LASIX) 20 MG tablet; Take 1 tablet (20 mg total) by mouth daily.  Wheezing -     DG Chest 2 View -     predniSONE (DELTASONE) 20 MG tablet; Take 3 tablets for 3 days, take 2 tablets for 3 days, take 1 tablet for 3 days, take 1/2 tablet for 4 days. -     ipratropium-albuterol (DUONEB) 0.5-2.5 (3) MG/3ML SOLN; Take 3 mLs by nebulization every 2 (two) hours as needed. -     AMBULATORY NON FORMULARY MEDICATION; Nebulizer machine and supplies for wheezing/retractions/asthma exacerbations. -     methylPREDNISolone sodium succinate (SOLU-MEDROL) 125 mg/2 mL injection 125 mg -     budesonide-formoterol (SYMBICORT) 160-4.5 MCG/ACT inhaler; Inhale 2 puffs into the lungs 2 (two) times daily.  Intercostal retractions -     predniSONE (DELTASONE) 20 MG tablet; Take 3 tablets for 3 days, take 2 tablets for 3 days, take 1 tablet for 3 days, take 1/2 tablet for 4 days. -     ipratropium-albuterol (DUONEB) 0.5-2.5 (3) MG/3ML SOLN; Take 3 mLs by nebulization every 2 (two) hours as needed. -     AMBULATORY NON FORMULARY MEDICATION; Nebulizer machine and supplies for wheezing/retractions/asthma exacerbations. -     budesonide-formoterol (SYMBICORT) 160-4.5 MCG/ACT  inhaler; Inhale 2 puffs into the lungs 2 (two) times daily.  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 -     ipratropium-albuterol (DUONEB) 0.5-2.5 (3) MG/3ML SOLN; Take 3 mLs by nebulization every 2 (two) hours as needed. -     AMBULATORY NON FORMULARY MEDICATION; Nebulizer machine and supplies for wheezing/retractions/asthma exacerbations. -     budesonide-formoterol (SYMBICORT) 160-4.5 MCG/ACT inhaler;  Inhale 2 puffs into the lungs 2 (two) times daily.  reassuring pulse ox.  CXR today suspicious for pneumonia.  Solumedrol 125mg  IM given today.  Start prednisone taper tomorrow.  Start Zpak today.  Albuterol nebulizer machine ordered with solution to do every 2-4 hours as needed.  symbicort to start tomorrow. 2 puffs twice a day.   Follow up in 2 weeks. Go to ER with any worsening SOB.   CVA-seeing cardiology and neurology. In PT/OT. On plavix. On statin. BP managed by home health. Elevated today but having a lot of problems breathing.

## 2019-03-31 NOTE — Patient Instructions (Signed)
Start zpak.  Start prednisone. Start lasix. Start symbicort.  Follow up in 2 weeks.

## 2019-04-01 DIAGNOSIS — R531 Weakness: Secondary | ICD-10-CM | POA: Diagnosis not present

## 2019-04-01 DIAGNOSIS — R4781 Slurred speech: Secondary | ICD-10-CM | POA: Diagnosis not present

## 2019-04-03 ENCOUNTER — Encounter: Payer: Self-pay | Admitting: Physician Assistant

## 2019-04-03 DIAGNOSIS — I639 Cerebral infarction, unspecified: Secondary | ICD-10-CM | POA: Insufficient documentation

## 2019-04-03 DIAGNOSIS — J189 Pneumonia, unspecified organism: Secondary | ICD-10-CM | POA: Insufficient documentation

## 2019-04-03 MED ORDER — BUDESONIDE-FORMOTEROL FUMARATE 160-4.5 MCG/ACT IN AERO
2.0000 | INHALATION_SPRAY | Freq: Two times a day (BID) | RESPIRATORY_TRACT | 0 refills | Status: DC
Start: 2019-04-03 — End: 2019-07-19

## 2019-04-03 NOTE — Telephone Encounter (Signed)
Travis Barajas is doing better. He has had a decrease in wheezing and shortness of breath.

## 2019-04-06 DIAGNOSIS — N400 Enlarged prostate without lower urinary tract symptoms: Secondary | ICD-10-CM | POA: Diagnosis not present

## 2019-04-06 DIAGNOSIS — E785 Hyperlipidemia, unspecified: Secondary | ICD-10-CM | POA: Diagnosis not present

## 2019-04-06 DIAGNOSIS — I739 Peripheral vascular disease, unspecified: Secondary | ICD-10-CM | POA: Diagnosis not present

## 2019-04-06 DIAGNOSIS — I69328 Other speech and language deficits following cerebral infarction: Secondary | ICD-10-CM | POA: Diagnosis not present

## 2019-04-06 DIAGNOSIS — I69322 Dysarthria following cerebral infarction: Secondary | ICD-10-CM | POA: Diagnosis not present

## 2019-04-06 DIAGNOSIS — J449 Chronic obstructive pulmonary disease, unspecified: Secondary | ICD-10-CM | POA: Diagnosis not present

## 2019-04-06 DIAGNOSIS — I10 Essential (primary) hypertension: Secondary | ICD-10-CM | POA: Diagnosis not present

## 2019-04-06 DIAGNOSIS — K219 Gastro-esophageal reflux disease without esophagitis: Secondary | ICD-10-CM | POA: Diagnosis not present

## 2019-04-06 DIAGNOSIS — D696 Thrombocytopenia, unspecified: Secondary | ICD-10-CM | POA: Diagnosis not present

## 2019-04-06 DIAGNOSIS — I69354 Hemiplegia and hemiparesis following cerebral infarction affecting left non-dominant side: Secondary | ICD-10-CM | POA: Diagnosis not present

## 2019-04-07 DIAGNOSIS — I69322 Dysarthria following cerebral infarction: Secondary | ICD-10-CM | POA: Diagnosis not present

## 2019-04-07 DIAGNOSIS — K219 Gastro-esophageal reflux disease without esophagitis: Secondary | ICD-10-CM | POA: Diagnosis not present

## 2019-04-07 DIAGNOSIS — I739 Peripheral vascular disease, unspecified: Secondary | ICD-10-CM | POA: Diagnosis not present

## 2019-04-07 DIAGNOSIS — E785 Hyperlipidemia, unspecified: Secondary | ICD-10-CM | POA: Diagnosis not present

## 2019-04-07 DIAGNOSIS — I10 Essential (primary) hypertension: Secondary | ICD-10-CM | POA: Diagnosis not present

## 2019-04-07 DIAGNOSIS — I69354 Hemiplegia and hemiparesis following cerebral infarction affecting left non-dominant side: Secondary | ICD-10-CM | POA: Diagnosis not present

## 2019-04-07 DIAGNOSIS — J449 Chronic obstructive pulmonary disease, unspecified: Secondary | ICD-10-CM | POA: Diagnosis not present

## 2019-04-07 DIAGNOSIS — D696 Thrombocytopenia, unspecified: Secondary | ICD-10-CM | POA: Diagnosis not present

## 2019-04-07 DIAGNOSIS — I69328 Other speech and language deficits following cerebral infarction: Secondary | ICD-10-CM | POA: Diagnosis not present

## 2019-04-07 DIAGNOSIS — N400 Enlarged prostate without lower urinary tract symptoms: Secondary | ICD-10-CM | POA: Diagnosis not present

## 2019-04-09 DIAGNOSIS — R531 Weakness: Secondary | ICD-10-CM | POA: Diagnosis not present

## 2019-04-10 DIAGNOSIS — J449 Chronic obstructive pulmonary disease, unspecified: Secondary | ICD-10-CM | POA: Diagnosis not present

## 2019-04-10 DIAGNOSIS — N400 Enlarged prostate without lower urinary tract symptoms: Secondary | ICD-10-CM | POA: Diagnosis not present

## 2019-04-10 DIAGNOSIS — I10 Essential (primary) hypertension: Secondary | ICD-10-CM | POA: Diagnosis not present

## 2019-04-10 DIAGNOSIS — K219 Gastro-esophageal reflux disease without esophagitis: Secondary | ICD-10-CM | POA: Diagnosis not present

## 2019-04-10 DIAGNOSIS — I69322 Dysarthria following cerebral infarction: Secondary | ICD-10-CM | POA: Diagnosis not present

## 2019-04-10 DIAGNOSIS — D696 Thrombocytopenia, unspecified: Secondary | ICD-10-CM | POA: Diagnosis not present

## 2019-04-10 DIAGNOSIS — I739 Peripheral vascular disease, unspecified: Secondary | ICD-10-CM | POA: Diagnosis not present

## 2019-04-10 DIAGNOSIS — I69354 Hemiplegia and hemiparesis following cerebral infarction affecting left non-dominant side: Secondary | ICD-10-CM | POA: Diagnosis not present

## 2019-04-10 DIAGNOSIS — I69328 Other speech and language deficits following cerebral infarction: Secondary | ICD-10-CM | POA: Diagnosis not present

## 2019-04-10 DIAGNOSIS — E785 Hyperlipidemia, unspecified: Secondary | ICD-10-CM | POA: Diagnosis not present

## 2019-04-11 DIAGNOSIS — E782 Mixed hyperlipidemia: Secondary | ICD-10-CM | POA: Diagnosis not present

## 2019-04-11 DIAGNOSIS — I351 Nonrheumatic aortic (valve) insufficiency: Secondary | ICD-10-CM | POA: Diagnosis not present

## 2019-04-11 DIAGNOSIS — I63219 Cerebral infarction due to unspecified occlusion or stenosis of unspecified vertebral arteries: Secondary | ICD-10-CM | POA: Diagnosis not present

## 2019-04-11 DIAGNOSIS — I10 Essential (primary) hypertension: Secondary | ICD-10-CM | POA: Diagnosis not present

## 2019-04-13 DIAGNOSIS — I10 Essential (primary) hypertension: Secondary | ICD-10-CM | POA: Diagnosis not present

## 2019-04-13 DIAGNOSIS — J449 Chronic obstructive pulmonary disease, unspecified: Secondary | ICD-10-CM | POA: Diagnosis not present

## 2019-04-13 DIAGNOSIS — K219 Gastro-esophageal reflux disease without esophagitis: Secondary | ICD-10-CM | POA: Diagnosis not present

## 2019-04-13 DIAGNOSIS — I69354 Hemiplegia and hemiparesis following cerebral infarction affecting left non-dominant side: Secondary | ICD-10-CM | POA: Diagnosis not present

## 2019-04-13 DIAGNOSIS — I739 Peripheral vascular disease, unspecified: Secondary | ICD-10-CM | POA: Diagnosis not present

## 2019-04-13 DIAGNOSIS — N400 Enlarged prostate without lower urinary tract symptoms: Secondary | ICD-10-CM | POA: Diagnosis not present

## 2019-04-13 DIAGNOSIS — I69328 Other speech and language deficits following cerebral infarction: Secondary | ICD-10-CM | POA: Diagnosis not present

## 2019-04-13 DIAGNOSIS — E785 Hyperlipidemia, unspecified: Secondary | ICD-10-CM | POA: Diagnosis not present

## 2019-04-13 DIAGNOSIS — I69322 Dysarthria following cerebral infarction: Secondary | ICD-10-CM | POA: Diagnosis not present

## 2019-04-13 DIAGNOSIS — D696 Thrombocytopenia, unspecified: Secondary | ICD-10-CM | POA: Diagnosis not present

## 2019-04-14 ENCOUNTER — Ambulatory Visit (INDEPENDENT_AMBULATORY_CARE_PROVIDER_SITE_OTHER): Payer: Medicare HMO | Admitting: Physician Assistant

## 2019-04-14 ENCOUNTER — Encounter: Payer: Self-pay | Admitting: Physician Assistant

## 2019-04-14 ENCOUNTER — Other Ambulatory Visit: Payer: Self-pay

## 2019-04-14 VITALS — BP 163/70 | HR 82 | Ht 63.5 in | Wt 184.0 lb

## 2019-04-14 DIAGNOSIS — I6339 Cerebral infarction due to thrombosis of other cerebral artery: Secondary | ICD-10-CM | POA: Diagnosis not present

## 2019-04-14 DIAGNOSIS — E785 Hyperlipidemia, unspecified: Secondary | ICD-10-CM | POA: Diagnosis not present

## 2019-04-14 DIAGNOSIS — R140 Abdominal distension (gaseous): Secondary | ICD-10-CM | POA: Insufficient documentation

## 2019-04-14 DIAGNOSIS — I10 Essential (primary) hypertension: Secondary | ICD-10-CM

## 2019-04-14 MED ORDER — PRAVASTATIN SODIUM 40 MG PO TABS: 40.0000 mg | ORAL_TABLET | Freq: Two times a day (BID) | ORAL | 3 refills | Status: DC

## 2019-04-14 NOTE — Patient Instructions (Signed)
Probiotic-for belly pain/distention.  Will get CT.

## 2019-04-14 NOTE — Progress Notes (Signed)
Subjective:    Patient ID: Travis Barajas, male    DOB: [birth year], 68 y.o.   MRN: [id number]  HPI  Pt is a 68 yo male with HTN, COPD and recent CVA. He saw cardiology on 10/27 who added cozaar 50mg  to medication list. He has been on it for last 3 days.   Pt denies any CP, palpitations, headaches. He does have intermittent SOB. Pt has stopped smoking.   Pt continues to improve from pneumonia. He request symbicort samples.   He is most concerned now with distended abdomen. He has noticed since early summer his belly keeps increasing in size. He has gone from a size 30 to 40 in pants. His belly feels hard and gassy. Denies any nausea, vomiting, diarrhea, constipation. He reports to have soft bowel movements once or twice a day. He does have some reflux symptoms intermittent. He denies any melena or hematochezia. He does report hx of having a blockage in his colon but "got better". No fever, chills, body aches.   .. Active Ambulatory Problems    Diagnosis Date Noted  . GERD (gastroesophageal reflux disease) 07/23/2011  . Allergic rhinitis 07/23/2011  . Joint pain 07/23/2011  . HTN (hypertension), malignant 10/01/2011  . Tobacco user 12/05/2011  . Dyspnea 10/18/2012  . Hematuria 11/07/2013  . Hyperlipidemia 11/17/2013  . BPH (benign prostatic hyperplasia) 11/21/2013  . COPD, moderate ([place]) 03/21/2014  . Erectile dysfunction 03/21/2014  . Claudication ([place]) 09/26/2014  . Generalized anxiety disorder 01/15/2015  . BPH without urinary obstruction 05/25/2016  . Acute nonseasonal allergic rhinitis due to pollen 05/04/2017  . Dyslipidemia 05/04/2017  . Current smoker 09/01/2017  . Hemoptysis 09/01/2017  . Left carotid bruit 09/01/2017  . Systolic murmur [id number]  . Trouble in sleeping 10/05/2018  . PVD (peripheral vascular disease) with claudication ([place]) 02/22/2019  . Thrombocytopenia ([place]) 02/24/2019  . Slurred speech 02/24/2019  . Dizziness 02/24/2019  . Dysphagia  02/24/2019  . TIA (transient ischemic attack) 02/24/2019  . Intercostal retractions 03/31/2019  . Cerebrovascular accident ([place]) 04/03/2019  . Pneumonia of left lower lobe due to infectious organism 04/03/2019  . Distended abdomen 04/14/2019  . Uncontrolled hypertension 04/14/2019   Resolved Ambulatory Problems    Diagnosis Date Noted  . No Resolved Ambulatory Problems   Past Medical History:  Diagnosis Date  . Allergy   . Anxiety   . Arthritis   . COPD (chronic obstructive pulmonary disease) ([place])   . Hypertension        Review of Systems See HPI.     Objective:   Physical Exam Vitals signs reviewed.  Constitutional:      Comments: Walking with walker.  HENT:     Head: Normocephalic.  Cardiovascular:     Rate and Rhythm: Normal rate and regular rhythm.     Pulses: Normal pulses.  Pulmonary:     Effort: Pulmonary effort is normal.     Breath sounds: Normal breath sounds. No wheezing or rhonchi.  Abdominal:     General: There is distension.     Tenderness: There is no abdominal tenderness. There is no right CVA tenderness, left CVA tenderness, guarding or rebound.     Hernia: No hernia is present.     Comments: Very hard and distended abdomen.   Musculoskeletal:     Right lower leg: No edema.     Left lower leg: No edema.  Neurological:     Mental Status: He is alert and oriented to person, place, and time.  Motor: Weakness present.     Coordination: Coordination abnormal.     Gait: Gait abnormal.     Deep Tendon Reflexes: Reflexes abnormal.     Comments: Left sided weakness after stroke.   Psychiatric:        Mood and Affect: Mood normal.           Assessment & Plan:  Travis Barajas KitchenShaiquan Barajas was seen today for follow-up.  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  HTN (hypertension), malignant  Uncontrolled hypertension  Distended abdomen   BP not to goal but has only been on cozaar for 3 days. Will report home health readings next week and consider increasing to 100mg  daily. Refilled pravahol and bid dosing.   Overall strength does appear to be increasing.   Breathing sounds better. Continue on symbicort.   Abdomen is distended. His liver enzymes and kidney were great last check. No peripheral edema. Unclear etiology. Will get CT scan. Start probiotic.

## 2019-04-17 ENCOUNTER — Encounter: Payer: Self-pay | Admitting: Physician Assistant

## 2019-04-17 DIAGNOSIS — D696 Thrombocytopenia, unspecified: Secondary | ICD-10-CM | POA: Diagnosis not present

## 2019-04-17 DIAGNOSIS — J449 Chronic obstructive pulmonary disease, unspecified: Secondary | ICD-10-CM | POA: Diagnosis not present

## 2019-04-17 DIAGNOSIS — I739 Peripheral vascular disease, unspecified: Secondary | ICD-10-CM | POA: Diagnosis not present

## 2019-04-17 DIAGNOSIS — K219 Gastro-esophageal reflux disease without esophagitis: Secondary | ICD-10-CM | POA: Diagnosis not present

## 2019-04-17 DIAGNOSIS — N400 Enlarged prostate without lower urinary tract symptoms: Secondary | ICD-10-CM | POA: Diagnosis not present

## 2019-04-17 DIAGNOSIS — E785 Hyperlipidemia, unspecified: Secondary | ICD-10-CM | POA: Diagnosis not present

## 2019-04-17 DIAGNOSIS — I69328 Other speech and language deficits following cerebral infarction: Secondary | ICD-10-CM | POA: Diagnosis not present

## 2019-04-17 DIAGNOSIS — I10 Essential (primary) hypertension: Secondary | ICD-10-CM | POA: Diagnosis not present

## 2019-04-17 DIAGNOSIS — I69322 Dysarthria following cerebral infarction: Secondary | ICD-10-CM | POA: Diagnosis not present

## 2019-04-17 DIAGNOSIS — I69354 Hemiplegia and hemiparesis following cerebral infarction affecting left non-dominant side: Secondary | ICD-10-CM | POA: Diagnosis not present

## 2019-04-19 ENCOUNTER — Telehealth: Payer: Self-pay | Admitting: Physician Assistant

## 2019-04-19 DIAGNOSIS — R140 Abdominal distension (gaseous): Secondary | ICD-10-CM

## 2019-04-19 NOTE — Telephone Encounter (Signed)
Will get u/s before CT.

## 2019-04-21 DIAGNOSIS — I739 Peripheral vascular disease, unspecified: Secondary | ICD-10-CM | POA: Diagnosis not present

## 2019-04-21 DIAGNOSIS — J449 Chronic obstructive pulmonary disease, unspecified: Secondary | ICD-10-CM | POA: Diagnosis not present

## 2019-04-21 DIAGNOSIS — N400 Enlarged prostate without lower urinary tract symptoms: Secondary | ICD-10-CM | POA: Diagnosis not present

## 2019-04-21 DIAGNOSIS — I69328 Other speech and language deficits following cerebral infarction: Secondary | ICD-10-CM | POA: Diagnosis not present

## 2019-04-21 DIAGNOSIS — E785 Hyperlipidemia, unspecified: Secondary | ICD-10-CM | POA: Diagnosis not present

## 2019-04-21 DIAGNOSIS — I69354 Hemiplegia and hemiparesis following cerebral infarction affecting left non-dominant side: Secondary | ICD-10-CM | POA: Diagnosis not present

## 2019-04-21 DIAGNOSIS — I10 Essential (primary) hypertension: Secondary | ICD-10-CM | POA: Diagnosis not present

## 2019-04-21 DIAGNOSIS — K219 Gastro-esophageal reflux disease without esophagitis: Secondary | ICD-10-CM | POA: Diagnosis not present

## 2019-04-21 DIAGNOSIS — D696 Thrombocytopenia, unspecified: Secondary | ICD-10-CM | POA: Diagnosis not present

## 2019-04-21 DIAGNOSIS — I69322 Dysarthria following cerebral infarction: Secondary | ICD-10-CM | POA: Diagnosis not present

## 2019-04-25 ENCOUNTER — Other Ambulatory Visit: Payer: Self-pay

## 2019-04-25 ENCOUNTER — Ambulatory Visit (INDEPENDENT_AMBULATORY_CARE_PROVIDER_SITE_OTHER): Payer: Medicare HMO

## 2019-04-25 DIAGNOSIS — R140 Abdominal distension (gaseous): Secondary | ICD-10-CM

## 2019-04-25 DIAGNOSIS — K8020 Calculus of gallbladder without cholecystitis without obstruction: Secondary | ICD-10-CM | POA: Diagnosis not present

## 2019-04-26 ENCOUNTER — Encounter: Payer: Self-pay | Admitting: Physician Assistant

## 2019-04-26 DIAGNOSIS — D696 Thrombocytopenia, unspecified: Secondary | ICD-10-CM | POA: Diagnosis not present

## 2019-04-26 DIAGNOSIS — I77811 Abdominal aortic ectasia: Secondary | ICD-10-CM | POA: Insufficient documentation

## 2019-04-26 DIAGNOSIS — K219 Gastro-esophageal reflux disease without esophagitis: Secondary | ICD-10-CM | POA: Diagnosis not present

## 2019-04-26 DIAGNOSIS — E785 Hyperlipidemia, unspecified: Secondary | ICD-10-CM | POA: Diagnosis not present

## 2019-04-26 DIAGNOSIS — N400 Enlarged prostate without lower urinary tract symptoms: Secondary | ICD-10-CM | POA: Diagnosis not present

## 2019-04-26 DIAGNOSIS — N281 Cyst of kidney, acquired: Secondary | ICD-10-CM | POA: Insufficient documentation

## 2019-04-26 DIAGNOSIS — I10 Essential (primary) hypertension: Secondary | ICD-10-CM | POA: Diagnosis not present

## 2019-04-26 DIAGNOSIS — I69354 Hemiplegia and hemiparesis following cerebral infarction affecting left non-dominant side: Secondary | ICD-10-CM | POA: Diagnosis not present

## 2019-04-26 DIAGNOSIS — J449 Chronic obstructive pulmonary disease, unspecified: Secondary | ICD-10-CM | POA: Diagnosis not present

## 2019-04-26 DIAGNOSIS — I739 Peripheral vascular disease, unspecified: Secondary | ICD-10-CM | POA: Diagnosis not present

## 2019-04-26 DIAGNOSIS — K8020 Calculus of gallbladder without cholecystitis without obstruction: Secondary | ICD-10-CM | POA: Insufficient documentation

## 2019-04-26 DIAGNOSIS — I69322 Dysarthria following cerebral infarction: Secondary | ICD-10-CM | POA: Diagnosis not present

## 2019-04-26 DIAGNOSIS — I69328 Other speech and language deficits following cerebral infarction: Secondary | ICD-10-CM | POA: Diagnosis not present

## 2019-04-26 NOTE — Telephone Encounter (Signed)
Call pt: you have one gallstone but no inflammation or wall thickening. You have a left renal cyst that is not concerning. Your abdominal aorta has some plaque and dilatated. Not causing distention but something we can monitor. Recheck in 1 year.   Are you still bloating? Bowel movements hard? Often?

## 2019-04-27 DIAGNOSIS — I69354 Hemiplegia and hemiparesis following cerebral infarction affecting left non-dominant side: Secondary | ICD-10-CM | POA: Diagnosis not present

## 2019-04-27 DIAGNOSIS — E785 Hyperlipidemia, unspecified: Secondary | ICD-10-CM | POA: Diagnosis not present

## 2019-04-27 DIAGNOSIS — J449 Chronic obstructive pulmonary disease, unspecified: Secondary | ICD-10-CM | POA: Diagnosis not present

## 2019-04-27 DIAGNOSIS — N400 Enlarged prostate without lower urinary tract symptoms: Secondary | ICD-10-CM | POA: Diagnosis not present

## 2019-04-27 DIAGNOSIS — K219 Gastro-esophageal reflux disease without esophagitis: Secondary | ICD-10-CM | POA: Diagnosis not present

## 2019-04-27 DIAGNOSIS — I69322 Dysarthria following cerebral infarction: Secondary | ICD-10-CM | POA: Diagnosis not present

## 2019-04-27 DIAGNOSIS — I739 Peripheral vascular disease, unspecified: Secondary | ICD-10-CM | POA: Diagnosis not present

## 2019-04-27 DIAGNOSIS — I69328 Other speech and language deficits following cerebral infarction: Secondary | ICD-10-CM | POA: Diagnosis not present

## 2019-04-27 DIAGNOSIS — I10 Essential (primary) hypertension: Secondary | ICD-10-CM | POA: Diagnosis not present

## 2019-04-27 DIAGNOSIS — D696 Thrombocytopenia, unspecified: Secondary | ICD-10-CM | POA: Diagnosis not present

## 2019-04-27 NOTE — Telephone Encounter (Signed)
Looking over records I don't see a colonoscopy. I think you were going to get this right before the stroke happened. I am making a referral to GI to get in soon for evaluation.

## 2019-04-27 NOTE — Addendum Note (Signed)
Addended by: Donella Stade on: 04/27/2019 07:05 AM   Modules accepted: Orders

## 2019-04-28 ENCOUNTER — Other Ambulatory Visit: Payer: Medicare HMO

## 2019-04-28 DIAGNOSIS — K219 Gastro-esophageal reflux disease without esophagitis: Secondary | ICD-10-CM | POA: Diagnosis not present

## 2019-04-28 DIAGNOSIS — I69322 Dysarthria following cerebral infarction: Secondary | ICD-10-CM | POA: Diagnosis not present

## 2019-04-28 DIAGNOSIS — N400 Enlarged prostate without lower urinary tract symptoms: Secondary | ICD-10-CM | POA: Diagnosis not present

## 2019-04-28 DIAGNOSIS — E785 Hyperlipidemia, unspecified: Secondary | ICD-10-CM | POA: Diagnosis not present

## 2019-04-28 DIAGNOSIS — J449 Chronic obstructive pulmonary disease, unspecified: Secondary | ICD-10-CM | POA: Diagnosis not present

## 2019-04-28 DIAGNOSIS — D696 Thrombocytopenia, unspecified: Secondary | ICD-10-CM | POA: Diagnosis not present

## 2019-04-28 DIAGNOSIS — I739 Peripheral vascular disease, unspecified: Secondary | ICD-10-CM | POA: Diagnosis not present

## 2019-04-28 DIAGNOSIS — I69328 Other speech and language deficits following cerebral infarction: Secondary | ICD-10-CM | POA: Diagnosis not present

## 2019-04-28 DIAGNOSIS — I69354 Hemiplegia and hemiparesis following cerebral infarction affecting left non-dominant side: Secondary | ICD-10-CM | POA: Diagnosis not present

## 2019-04-28 DIAGNOSIS — I10 Essential (primary) hypertension: Secondary | ICD-10-CM | POA: Diagnosis not present

## 2019-05-01 DIAGNOSIS — I69354 Hemiplegia and hemiparesis following cerebral infarction affecting left non-dominant side: Secondary | ICD-10-CM | POA: Diagnosis not present

## 2019-05-01 DIAGNOSIS — N400 Enlarged prostate without lower urinary tract symptoms: Secondary | ICD-10-CM | POA: Diagnosis not present

## 2019-05-01 DIAGNOSIS — J449 Chronic obstructive pulmonary disease, unspecified: Secondary | ICD-10-CM | POA: Diagnosis not present

## 2019-05-01 DIAGNOSIS — I739 Peripheral vascular disease, unspecified: Secondary | ICD-10-CM | POA: Diagnosis not present

## 2019-05-01 DIAGNOSIS — K219 Gastro-esophageal reflux disease without esophagitis: Secondary | ICD-10-CM | POA: Diagnosis not present

## 2019-05-01 DIAGNOSIS — I10 Essential (primary) hypertension: Secondary | ICD-10-CM | POA: Diagnosis not present

## 2019-05-01 DIAGNOSIS — I69328 Other speech and language deficits following cerebral infarction: Secondary | ICD-10-CM | POA: Diagnosis not present

## 2019-05-01 DIAGNOSIS — D696 Thrombocytopenia, unspecified: Secondary | ICD-10-CM | POA: Diagnosis not present

## 2019-05-01 DIAGNOSIS — I69322 Dysarthria following cerebral infarction: Secondary | ICD-10-CM | POA: Diagnosis not present

## 2019-05-01 DIAGNOSIS — E785 Hyperlipidemia, unspecified: Secondary | ICD-10-CM | POA: Diagnosis not present

## 2019-05-01 NOTE — Addendum Note (Signed)
Addended by: Donella Stade on: 05/01/2019 08:06 AM   Modules accepted: Orders

## 2019-05-01 NOTE — Telephone Encounter (Signed)
Made referral for GI to take a look and see if they want to order CT scan.

## 2019-05-03 DIAGNOSIS — R140 Abdominal distension (gaseous): Secondary | ICD-10-CM | POA: Diagnosis not present

## 2019-05-03 DIAGNOSIS — K5900 Constipation, unspecified: Secondary | ICD-10-CM | POA: Diagnosis not present

## 2019-05-03 DIAGNOSIS — I639 Cerebral infarction, unspecified: Secondary | ICD-10-CM | POA: Diagnosis not present

## 2019-05-05 ENCOUNTER — Other Ambulatory Visit: Payer: Self-pay | Admitting: Physician Assistant

## 2019-05-05 DIAGNOSIS — I69322 Dysarthria following cerebral infarction: Secondary | ICD-10-CM | POA: Diagnosis not present

## 2019-05-05 DIAGNOSIS — N400 Enlarged prostate without lower urinary tract symptoms: Secondary | ICD-10-CM | POA: Diagnosis not present

## 2019-05-05 DIAGNOSIS — I10 Essential (primary) hypertension: Secondary | ICD-10-CM | POA: Diagnosis not present

## 2019-05-05 DIAGNOSIS — I69328 Other speech and language deficits following cerebral infarction: Secondary | ICD-10-CM | POA: Diagnosis not present

## 2019-05-05 DIAGNOSIS — E785 Hyperlipidemia, unspecified: Secondary | ICD-10-CM | POA: Diagnosis not present

## 2019-05-05 DIAGNOSIS — D696 Thrombocytopenia, unspecified: Secondary | ICD-10-CM | POA: Diagnosis not present

## 2019-05-05 DIAGNOSIS — F419 Anxiety disorder, unspecified: Secondary | ICD-10-CM

## 2019-05-05 DIAGNOSIS — K219 Gastro-esophageal reflux disease without esophagitis: Secondary | ICD-10-CM | POA: Diagnosis not present

## 2019-05-05 DIAGNOSIS — I69354 Hemiplegia and hemiparesis following cerebral infarction affecting left non-dominant side: Secondary | ICD-10-CM | POA: Diagnosis not present

## 2019-05-05 DIAGNOSIS — J449 Chronic obstructive pulmonary disease, unspecified: Secondary | ICD-10-CM | POA: Diagnosis not present

## 2019-05-05 DIAGNOSIS — I739 Peripheral vascular disease, unspecified: Secondary | ICD-10-CM | POA: Diagnosis not present

## 2019-05-22 ENCOUNTER — Other Ambulatory Visit: Payer: Self-pay | Admitting: Physician Assistant

## 2019-05-22 ENCOUNTER — Other Ambulatory Visit: Payer: Self-pay

## 2019-05-22 ENCOUNTER — Ambulatory Visit (INDEPENDENT_AMBULATORY_CARE_PROVIDER_SITE_OTHER): Payer: Medicare HMO | Admitting: Physician Assistant

## 2019-05-22 ENCOUNTER — Encounter: Payer: Self-pay | Admitting: Physician Assistant

## 2019-05-22 VITALS — BP 176/75 | HR 80 | Ht 63.5 in | Wt 189.0 lb

## 2019-05-22 DIAGNOSIS — J441 Chronic obstructive pulmonary disease with (acute) exacerbation: Secondary | ICD-10-CM | POA: Diagnosis not present

## 2019-05-22 DIAGNOSIS — J449 Chronic obstructive pulmonary disease, unspecified: Secondary | ICD-10-CM | POA: Diagnosis not present

## 2019-05-22 DIAGNOSIS — R0602 Shortness of breath: Secondary | ICD-10-CM

## 2019-05-22 DIAGNOSIS — I10 Essential (primary) hypertension: Secondary | ICD-10-CM | POA: Diagnosis not present

## 2019-05-22 DIAGNOSIS — J189 Pneumonia, unspecified organism: Secondary | ICD-10-CM

## 2019-05-22 DIAGNOSIS — R062 Wheezing: Secondary | ICD-10-CM

## 2019-05-22 DIAGNOSIS — I6339 Cerebral infarction due to thrombosis of other cerebral artery: Secondary | ICD-10-CM

## 2019-05-22 DIAGNOSIS — R69 Illness, unspecified: Secondary | ICD-10-CM | POA: Diagnosis not present

## 2019-05-22 DIAGNOSIS — R0689 Other abnormalities of breathing: Secondary | ICD-10-CM

## 2019-05-22 DIAGNOSIS — G478 Other sleep disorders: Secondary | ICD-10-CM

## 2019-05-22 MED ORDER — PREDNISONE 20 MG PO TABS: ORAL_TABLET | ORAL | 0 refills | Status: DC

## 2019-05-22 MED ORDER — INCRUSE ELLIPTA 62.5 MCG/INH IN AEPB: 1.0000 | INHALATION_SPRAY | Freq: Every day | RESPIRATORY_TRACT | 5 refills | Status: DC

## 2019-05-22 MED ORDER — AZITHROMYCIN 250 MG PO TABS: ORAL_TABLET | ORAL | 0 refills | Status: DC

## 2019-05-22 MED ORDER — HYDRALAZINE HCL 25 MG PO TABS: 25.0000 mg | ORAL_TABLET | Freq: Three times a day (TID) | ORAL | 5 refills | Status: DC

## 2019-05-22 NOTE — Patient Instructions (Signed)
Stop pravastatin for 2 weeks and let me know about itching.   Take prednisone and zpak for exacerbation of lungs.  Will get night oxygen checked.  Start incruse once daily one puff WITH symbicort.   Increased hydralazine to 25mg  three times a day.  Continue on all medications including lasix.   Report BP readings in next 2 weeks.

## 2019-05-22 NOTE — Progress Notes (Signed)
Subjective:    Patient ID: Travis Barajas, male    DOB: 1950/07/28, 68 y.o.   MRN: [id number]  HPI  Patient is a 68 year old male with recent CVA, hypertension, COPD who presents to the clinic with worsening shortness of breath and wheezing.  He has been more more short of breath.  He denies any fever, chills, headaches, palpitations, body aches or fatigue.  He continues to have stopped smoking. He is using the symbicort daily and rescue inhaler regularly. He was doing better and then a few days ago breathing worsened. Denies any edema. He is having problems breathing at night. He often does feel SOb.  He is taking medications. He admits he stopped losaartan because he thought it was causing him to itch. It continues to itch.    .. Active Ambulatory Problems    Diagnosis Date Noted  . GERD (gastroesophageal reflux disease) 07/23/2011  . Allergic rhinitis 07/23/2011  . Joint pain 07/23/2011  . HTN (hypertension), malignant 10/01/2011  . Tobacco user 12/05/2011  . Dyspnea 10/18/2012  . Hematuria 11/07/2013  . Hyperlipidemia 11/17/2013  . BPH (benign prostatic hyperplasia) 11/21/2013  . COPD, moderate ([place]) 03/21/2014  . Erectile dysfunction 03/21/2014  . Claudication ([place]) 09/26/2014  . Generalized anxiety disorder 01/15/2015  . BPH without urinary obstruction 05/25/2016  . Acute nonseasonal allergic rhinitis due to pollen 05/04/2017  . Dyslipidemia 05/04/2017  . Current smoker 09/01/2017  . Hemoptysis 09/01/2017  . Left carotid bruit 09/01/2017  . Systolic murmur [id number]  . Trouble in sleeping 10/05/2018  . PVD (peripheral vascular disease) with claudication ([place]) 02/22/2019  . Thrombocytopenia ([place]) 02/24/2019  . Slurred speech 02/24/2019  . Dizziness 02/24/2019  . Dysphagia 02/24/2019  . TIA (transient ischemic attack) 02/24/2019  . Intercostal retractions 03/31/2019  . Cerebrovascular accident ([place]) 04/03/2019  . Distended abdomen 04/14/2019  . Uncontrolled  hypertension 04/14/2019  . 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[place]) 04/26/2019  . Renal cyst, left 04/26/2019  . Gallstone 04/26/2019   Resolved Ambulatory Problems    Diagnosis Date Noted  . Pneumonia of left lower lobe due to infectious organism 04/03/2019   Past Medical History:  Diagnosis Date  . Allergy   . Anxiety   . Arthritis   . COPD (chronic obstructive pulmonary disease) ([place])   . Hypertension      Review of Systems See HPI.     Objective:   Physical Exam Vitals signs reviewed.  Constitutional:      Appearance: Normal appearance.  HENT:     Head: Normocephalic.  Cardiovascular:     Rate and Rhythm: Normal rate and regular rhythm.     Pulses: Normal pulses.     Heart sounds: Murmur present.  Pulmonary:     Comments: Coarse breath sounds bilaterally. Wheezing noted bilaterally.  Expiratory wheezing heard without auscultation.  Neurological:     General: No focal deficit present.     Mental Status: He is alert.           Assessment & Plan:  Marland KitchenMarland KitchenBerge Barajas was seen today for copd.  Diagnoses and all orders for this visit:  COPD exacerbation ([place]) -     predniSONE (DELTASONE) 20 MG tablet; Take 3 tablets for 3 days, take 2 tablets for 3 days, take 1 tablet for 3 days, take 1/2 tablet for 4 days. -     azithromycin (ZITHROMAX Z-PAK) 250 MG tablet; Take 2 tablets (500 mg) on  Day 1,  followed by 1 tablet (250 mg) once daily on Days  2 through 5.  Uncontrolled hypertension -     hydrALAZINE (APRESOLINE) 25 MG tablet; Take 1 tablet (25 mg total) by mouth 3 (three) times daily.  COPD, moderate (HCC) -     umeclidinium bromide (INCRUSE ELLIPTA) 62.5 MCG/INH AEPB; Inhale 1 puff into the lungs daily. -     Pulse oximetry, overnight -     Home sleep test  Cerebrovascular accident (CVA) due to thrombosis of other cerebral artery (HCC)  SOB (shortness of breath) -     Pulse oximetry, overnight -     Home sleep test  Non-restorative sleep -     Home sleep  test   No signs of fluid overload. No peripheral edema. EF normal in hospital. Lung sounds: wheezing and coarse breath sounds. Continue on symbicort. zpak and prednisone sent. Added incruse.   Blood pressure not to goal.  Had a long discussion with patient how important it is to control his blood pressure.  He reports to taking medication as prescribed.  He is not checking his blood pressure at home.  I encouraged him to start keeping a log of his blood pressure at home.  Increase hydralazine to 25 mg 3 times a day.  Continue all the other same medications.  I did not add back the losartan at this time.  Does not seem like the itching is coming from the losartan because he has been off it for almost a month and the itching is still present.  If his blood pressures are continue to not be controlled we will add this back.  He was recently started on Pravachol for his cholesterol.  Certainly statins carry a lot of side effects.  I advised him to hold it for 2 weeks and see if any of the itching resolves with holding the statin.  Discussed we do need to know if he stops this medication as to replace it.

## 2019-05-24 ENCOUNTER — Encounter: Payer: Self-pay | Admitting: Physician Assistant

## 2019-05-26 ENCOUNTER — Telehealth: Payer: Self-pay | Admitting: Neurology

## 2019-05-26 NOTE — Telephone Encounter (Signed)
Patient's wife left vm with this weeks BP readings:  147/78 137/73 136/68 139/67 144/79 127/67

## 2019-05-26 NOTE — Telephone Encounter (Signed)
Much Much better.

## 2019-05-30 DIAGNOSIS — H52223 Regular astigmatism, bilateral: Secondary | ICD-10-CM | POA: Diagnosis not present

## 2019-06-13 ENCOUNTER — Other Ambulatory Visit: Payer: Self-pay

## 2019-06-13 ENCOUNTER — Ambulatory Visit (INDEPENDENT_AMBULATORY_CARE_PROVIDER_SITE_OTHER): Payer: Medicare HMO | Admitting: Physician Assistant

## 2019-06-13 VITALS — BP 168/62 | HR 78 | Ht 63.5 in | Wt 185.0 lb

## 2019-06-13 DIAGNOSIS — E785 Hyperlipidemia, unspecified: Secondary | ICD-10-CM

## 2019-06-13 DIAGNOSIS — F419 Anxiety disorder, unspecified: Secondary | ICD-10-CM

## 2019-06-13 DIAGNOSIS — J449 Chronic obstructive pulmonary disease, unspecified: Secondary | ICD-10-CM

## 2019-06-13 DIAGNOSIS — L209 Atopic dermatitis, unspecified: Secondary | ICD-10-CM

## 2019-06-13 DIAGNOSIS — R69 Illness, unspecified: Secondary | ICD-10-CM | POA: Diagnosis not present

## 2019-06-13 DIAGNOSIS — I6339 Cerebral infarction due to thrombosis of other cerebral artery: Secondary | ICD-10-CM | POA: Diagnosis not present

## 2019-06-13 DIAGNOSIS — I10 Essential (primary) hypertension: Secondary | ICD-10-CM

## 2019-06-13 DIAGNOSIS — M62838 Other muscle spasm: Secondary | ICD-10-CM

## 2019-06-13 DIAGNOSIS — R600 Localized edema: Secondary | ICD-10-CM

## 2019-06-13 MED ORDER — FUROSEMIDE 20 MG PO TABS: 20.0000 mg | ORAL_TABLET | Freq: Every day | ORAL | 5 refills | Status: DC

## 2019-06-13 MED ORDER — LOSARTAN POTASSIUM 50 MG PO TABS: 50.0000 mg | ORAL_TABLET | Freq: Every day | ORAL | 2 refills | Status: DC

## 2019-06-13 MED ORDER — TRIAMCINOLONE ACETONIDE 0.1 % EX CREA: 1.0000 "application " | TOPICAL_CREAM | Freq: Two times a day (BID) | CUTANEOUS | 0 refills | Status: DC

## 2019-06-13 MED ORDER — CYCLOBENZAPRINE HCL 10 MG PO TABS: 10.0000 mg | ORAL_TABLET | Freq: Three times a day (TID) | ORAL | 0 refills | Status: DC | PRN

## 2019-06-13 MED ORDER — HYDRALAZINE HCL 25 MG PO TABS: 25.0000 mg | ORAL_TABLET | Freq: Three times a day (TID) | ORAL | 5 refills | Status: DC

## 2019-06-13 MED ORDER — LIVALO 1 MG PO TABS: ORAL_TABLET | ORAL | 2 refills | Status: DC

## 2019-06-13 MED ORDER — ALPRAZOLAM 0.5 MG PO TABS: 0.5000 mg | ORAL_TABLET | Freq: Two times a day (BID) | ORAL | 0 refills | Status: DC | PRN

## 2019-06-13 NOTE — Patient Instructions (Signed)
Restart losartan.  Flexeril as needed for muscles.  Keep BP log and report readings in 2 weeks.

## 2019-06-13 NOTE — Progress Notes (Signed)
Recent blood pressure readings since 06/04/2019: 140/67 141/72 143/67 158/73 147/79 142/79 145/69

## 2019-06-15 DIAGNOSIS — R600 Localized edema: Secondary | ICD-10-CM | POA: Insufficient documentation

## 2019-06-15 DIAGNOSIS — F419 Anxiety disorder, unspecified: Secondary | ICD-10-CM | POA: Insufficient documentation

## 2019-06-15 NOTE — Progress Notes (Signed)
Subjective:    Patient ID: Travis Barajas, male    DOB: May 22, 1951, 68 y.o.   MRN: [id number]  HPI Pt is a 68 yo male with recent CVA and uncontrolled HTN who presents to the clinic today for BP follow up.   Overall patient is doing better. PT and OT have stopped. He has a lot of his strength in the left side of his body. He denies any headaches or dizziness. He does have some memory issues. He is taking his medications as directed. He is checking BP at home and ranging 140's over 70s. No CP.   He did stop pravachol and itching stopped. He has not been on medication.   He does have some itchy ear lobes he would like to be looked at. Only putting lotion on them. Seems to help some.   Anxiety and mood great. Only using xanax very sparingly.   He is having some muscle cramps/spasms of legs. Most left sided. Wonders what he can do.   .. Active Ambulatory Problems    Diagnosis Date Noted  . GERD (gastroesophageal reflux disease) 07/23/2011  . Allergic rhinitis 07/23/2011  . Joint pain 07/23/2011  . HTN (hypertension), malignant 10/01/2011  . Tobacco user 12/05/2011  . Dyspnea 10/18/2012  . Hematuria 11/07/2013  . Hyperlipidemia 11/17/2013  . BPH (benign prostatic hyperplasia) 11/21/2013  . COPD, moderate ([place]) 03/21/2014  . Erectile dysfunction 03/21/2014  . Claudication ([place]) 09/26/2014  . Generalized anxiety disorder 01/15/2015  . BPH without urinary obstruction 05/25/2016  . Acute nonseasonal allergic rhinitis due to pollen 05/04/2017  . Dyslipidemia 05/04/2017  . Current smoker 09/01/2017  . Hemoptysis 09/01/2017  . Left carotid bruit 09/01/2017  . Systolic murmur [id number]  . Trouble in sleeping 10/05/2018  . PVD (peripheral vascular disease) with claudication ([place]) 02/22/2019  . Thrombocytopenia ([place]) 02/24/2019  . Slurred speech 02/24/2019  . Dizziness 02/24/2019  . Dysphagia 02/24/2019  . TIA (transient ischemic attack) 02/24/2019  . Intercostal retractions  03/31/2019  . Cerebrovascular accident ([place]) 04/03/2019  . Distended abdomen 04/14/2019  . Uncontrolled hypertension 04/14/2019  . 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[place]) 04/26/2019  . Renal cyst, left 04/26/2019  . Gallstone 04/26/2019  . Lower extremity edema 06/15/2019  . Anxiety 06/15/2019   Resolved Ambulatory Problems    Diagnosis Date Noted  . Pneumonia of left lower lobe due to infectious organism 04/03/2019   Past Medical History:  Diagnosis Date  . Allergy   . Arthritis   . COPD (chronic obstructive pulmonary disease) ([place])   . Hypertension        Review of Systems See HPI>     Objective:   Physical Exam Vitals reviewed.  Constitutional:      Appearance: Normal appearance.  HENT:     Head: Normocephalic.  Cardiovascular:     Rate and Rhythm: Normal rate and regular rhythm.     Pulses: Normal pulses.     Heart sounds: Murmur present.  Pulmonary:     Effort: Pulmonary effort is normal.     Comments: Scant expiratory wheezes.  Musculoskeletal:     Right lower leg: No edema.     Left lower leg: No edema.  Skin:    Comments: Scaly and flaky ear lobes and behind ear, bilaterally.   Neurological:     General: No focal deficit present.     Mental Status: He is alert and oriented to person, place, and time.     Motor: Weakness present.     Comments:  Left sided weakness scant difference after stroke.   Psychiatric:        Mood and Affect: Mood normal.           Assessment & Plan:  Marland KitchenMarland KitchenOlton Prejean was seen today for hypertension.  Diagnoses and all orders for this visit:  Uncontrolled hypertension -     losartan (COZAAR) 50 MG tablet; Take 1 tablet (50 mg total) by mouth daily. -     hydrALAZINE (APRESOLINE) 25 MG tablet; Take 1 tablet (25 mg total) by mouth 3 (three) times daily.  Anxiety -     ALPRAZolam (XANAX) 0.5 MG tablet; Take 1 tablet (0.5 mg total) by mouth 2 (two) times daily as needed. for anxiety  Lower extremity edema -     furosemide  (LASIX) 20 MG tablet; Take 1 tablet (20 mg total) by mouth daily.  Dyslipidemia -     Pitavastatin Calcium (LIVALO) 1 MG TABS; Take one tablet Monday, Wednesday, Friday.  COPD, moderate ([place])  Cerebrovascular accident (CVA) due to thrombosis of other cerebral artery (HCC) -     Pitavastatin Calcium (LIVALO) 1 MG TABS; Take one tablet Monday, Wednesday, Friday. -     losartan (COZAAR) 50 MG tablet; Take 1 tablet (50 mg total) by mouth daily. -     hydrALAZINE (APRESOLINE) 25 MG tablet; Take 1 tablet (25 mg total) by mouth 3 (three) times daily.  Muscle spasm -     cyclobenzaprine (FLEXERIL) 10 MG tablet; Take 1 tablet (10 mg total) by mouth 3 (three) times daily as needed for muscle spasms.  Atopic dermatitis, unspecified type -     triamcinolone cream (KENALOG) 0.1 %; Apply 1 application topically 2 (two) times daily.   BP not to goal in office but at home readings are much better. Added losaartan back I do not think was causing itching. Keep BP log and return in 2 weeks. Refilled other needed medications.   triamcinlone given for eczema like rash of ear lobes.   Stroke and CAD hx needs to be on statin. Failed lipitor and pravachol will try livalo.   Swelling in ankles great today. Continue with lasix.   Try as needed flexeril for muscle spasms. Warm compress and massage encouraged.    Marland Kitchen.Spent 30 minutes with patient and greater than 50 percent of visit spent counseling patient regarding treatment plan.

## 2019-06-19 ENCOUNTER — Encounter: Payer: Self-pay | Admitting: Physician Assistant

## 2019-06-22 ENCOUNTER — Encounter: Payer: Self-pay | Admitting: Physician Assistant

## 2019-06-22 ENCOUNTER — Ambulatory Visit (INDEPENDENT_AMBULATORY_CARE_PROVIDER_SITE_OTHER): Payer: Medicare HMO | Admitting: Physician Assistant

## 2019-06-22 VITALS — Temp 96.1°F | Ht 63.5 in | Wt 185.0 lb

## 2019-06-22 DIAGNOSIS — Z20822 Contact with and (suspected) exposure to covid-19: Secondary | ICD-10-CM | POA: Diagnosis not present

## 2019-06-22 DIAGNOSIS — R0602 Shortness of breath: Secondary | ICD-10-CM

## 2019-06-22 DIAGNOSIS — I10 Essential (primary) hypertension: Secondary | ICD-10-CM | POA: Diagnosis not present

## 2019-06-22 DIAGNOSIS — J441 Chronic obstructive pulmonary disease with (acute) exacerbation: Secondary | ICD-10-CM | POA: Diagnosis not present

## 2019-06-22 DIAGNOSIS — Z87891 Personal history of nicotine dependence: Secondary | ICD-10-CM | POA: Diagnosis not present

## 2019-06-22 DIAGNOSIS — R062 Wheezing: Secondary | ICD-10-CM

## 2019-06-22 DIAGNOSIS — R9431 Abnormal electrocardiogram [ECG] [EKG]: Secondary | ICD-10-CM | POA: Diagnosis not present

## 2019-06-22 NOTE — Progress Notes (Signed)
Virtual Visit via Video (App used: DOXIMITY) Note  I connected with      Travis Barajas on 06/22/19 at 9:45 AM  by a telemedicine application and verified that I am speaking with the correct person using two identifiers.  Patient is at home in [facility] I am in office   I discussed the limitations of evaluation and management by telemedicine and the availability of in person appointments. The patient expressed understanding and agreed to proceed.  History of Present Illness: Travis Barajas is a 69 y.o. male who would like to discuss "difficulty breathing/wheezing"  History is provided by patient and his wife   Patient with COPD and hx of CVA presents with wheezing and labored breathing for 3 days, gradually worsening Associated with fatigue and intermittent cough that is occasionally productive of clear sputum He is doing 4 DuoNeb treatments per day in addition to Symbicort and Incruse inhalers without much relief Denies fever, chest pain, blood-tinged sputum. Denies sick contacts.  Wife states this has been a recurrent problem ever since his CVA last year.    Observations/Objective: Temp (!) 96.1 F (35.6 C) (Oral)   Ht 5' 3.5" (1.613 m)   Wt 185 lb (83.9 kg)   BMI 32.26 kg/m  BP Readings from Last 3 Encounters:  06/13/19 (!) 168/62  05/22/19 (!) 176/75  04/14/19 (!) 163/70   Exam limited by telehealth: Gen: patient is laying in bed with eyes closed, he appears lethargic but is able to answer questions, he is ill-appearing, no acute distress Pulm: audible wheezing, increased work of breathing, normal phonation   Lab and Radiology Results No results found for this or any previous visit (from the past 72 hour(s)). No results found.     Assessment and Plan: 70 y.o. male with The primary encounter diagnosis was Wheezing. A diagnosis of Shortness of breath was also pertinent to this visit.  Advised patient and his wife to proceed to nearest Emergency  Department for further evaluation due to audible wheezing on video, lethargy and breathing difficulties Patient was not in agreement with treatment plan and requested an appt with another provider Spoke with Dr. Madilyn Fireman who agreed with recommendation of sending patient to ED. Called patient back and advised him that MD also recommends ED eval. Patient and wife expressed understanding,   PDMP not reviewed this encounter. No orders of the defined types were placed in this encounter.  No orders of the defined types were placed in this encounter.  There are no Patient Instructions on file for this visit.  Instructions sent via MyChart. If MyChart not available, pt was given option for info via personal e-mail w/ no guarantee of protected health info over unsecured e-mail communication, and MyChart sign-up instructions were sent to patient.   Follow Up Instructions: No follow-ups on file.    I discussed the assessment and treatment plan with the patient. The patient was provided an opportunity to ask questions and all were answered. The patient agreed with the plan and demonstrated an understanding of the instructions.   The patient was advised to call back or seek an in-person evaluation if any new concerns, if symptoms worsen or if the condition fails to improve as anticipated.  8 minutes of non-face-to-face time was provided during this encounter.      . . . . . . . . . . . . . Marland Kitchen  Historical information moved to improve visibility of documentation.  Past Medical History:  Diagnosis Date  . Allergy   . Anxiety   . Arthritis   . COPD (chronic obstructive pulmonary disease) ([place])   . Hyperlipidemia   . Hypertension    Past Surgical History:  Procedure Laterality Date  . FOOT SURGERY     Social History   Tobacco Use  . Smoking status: Former Smoker    Packs/day: 0.50    Years: 40.00    Pack years: 20.00    Types: Cigarettes     Quit date: 12/15/2014    Years since quitting: 4.5  . Smokeless tobacco: Never Used  Substance Use Topics  . Alcohol use: Yes    Alcohol/week: 0.0 standard drinks    Comment: occs   family history includes Alzheimer's disease in his mother; CAD in his mother; Cancer in his brother and daughter; Diabetes in his father and mother; Heart disease in his mother; Hyperlipidemia in his mother; Hypertension in his father and mother; Peripheral vascular disease in his brother.  Medications: Current Outpatient Medications  Medication Sig Dispense Refill  . albuterol (VENTOLIN HFA) 108 (90 Base) MCG/ACT inhaler Inhale 2 puffs into the lungs every 6 (six) hours as needed. 16 g 1  . ALPRAZolam (XANAX) 0.5 MG tablet Take 1 tablet (0.5 mg total) by mouth 2 (two) times daily as needed. for anxiety 30 tablet 0  . AMBULATORY NON FORMULARY MEDICATION Nebulizer machine and supplies for wheezing/retractions/asthma exacerbations. 1 Device 0  . amLODipine (NORVASC) 10 MG tablet Take 1 tablet (10 mg total) by mouth daily. 90 tablet 1  . budesonide-formoterol (SYMBICORT) 160-4.5 MCG/ACT inhaler Inhale 2 puffs into the lungs 2 (two) times daily. 1 Inhaler 0  . clopidogrel (PLAVIX) 75 MG tablet Take 75 mg by mouth daily.    . cyclobenzaprine (FLEXERIL) 10 MG tablet Take 1 tablet (10 mg total) by mouth 3 (three) times daily as needed for muscle spasms. 30 tablet 0  . doxazosin (CARDURA) 4 MG tablet Take 1 tablet (4 mg total) by mouth daily. 90 tablet 3  . furosemide (LASIX) 20 MG tablet Take 1 tablet (20 mg total) by mouth daily. 30 tablet 5  . hydrALAZINE (APRESOLINE) 25 MG tablet Take 1 tablet (25 mg total) by mouth 3 (three) times daily. 90 tablet 5  . ibuprofen (ADVIL) 600 MG tablet Take 600 mg by mouth every 6 (six) hours as needed.    Marland Kitchen ipratropium-albuterol (DUONEB) 0.5-2.5 (3) MG/3ML SOLN Take 3 mLs by nebulization every 2 (two) hours as needed. 360 mL 0  . levocetirizine (XYZAL) 5 MG tablet Take 1 tablet (5 mg  total) by mouth daily. 90 tablet 3  . losartan (COZAAR) 50 MG tablet Take 1 tablet (50 mg total) by mouth daily. 30 tablet 2  . metoprolol succinate (TOPROL-XL) 100 MG 24 hr tablet Take 100 mg by mouth daily. Take with or immediately following a meal.    . oxymetazoline (AFRIN SINUS) 0.05 % nasal spray Place 1 spray into both nostrils 2 (two) times daily. Only for 3 days. 30 mL 0  . Pitavastatin Calcium (LIVALO) 1 MG TABS Take one tablet Monday, Wednesday, Friday. 12 tablet 2  . traZODone (DESYREL) 50 MG tablet Take 1 tablet (50 mg total) by mouth at bedtime. 30 tablet 5  . triamcinolone cream (KENALOG) 0.1 % Apply 1 application topically 2 (two) times daily. 60 g 0  . umeclidinium bromide (INCRUSE ELLIPTA) 62.5 MCG/INH AEPB Inhale 1 puff into  the lungs daily. 30 each 5   No current facility-administered medications for this visit.   Allergies  Allergen Reactions  . Cialis [Tadalafil]     Hallucinations   . Lipitor [Atorvastatin]     itching  . Niacin And Related Itching  . Pravachol [Pravastatin] Itching    Itching.

## 2019-06-22 NOTE — Progress Notes (Signed)
Wheezing/labored breathing - last 3-4 days, getting progressively worse Will check blood pressure and give to [place] at appt

## 2019-07-12 ENCOUNTER — Other Ambulatory Visit: Payer: Self-pay | Admitting: Physician Assistant

## 2019-07-12 DIAGNOSIS — J189 Pneumonia, unspecified organism: Secondary | ICD-10-CM

## 2019-07-12 DIAGNOSIS — J449 Chronic obstructive pulmonary disease, unspecified: Secondary | ICD-10-CM

## 2019-07-12 DIAGNOSIS — R0689 Other abnormalities of breathing: Secondary | ICD-10-CM

## 2019-07-12 DIAGNOSIS — R69 Illness, unspecified: Secondary | ICD-10-CM | POA: Diagnosis not present

## 2019-07-12 DIAGNOSIS — R062 Wheezing: Secondary | ICD-10-CM

## 2019-07-14 ENCOUNTER — Other Ambulatory Visit: Payer: Self-pay | Admitting: Physician Assistant

## 2019-07-14 DIAGNOSIS — E785 Hyperlipidemia, unspecified: Secondary | ICD-10-CM

## 2019-07-14 NOTE — Progress Notes (Signed)
Subjective:   Travis Barajas is a 69 y.o. male who presents for an Initial Medicare Annual Wellness Visit.  Review of Systems  No ROS.  Medicare Wellness Virtual Visit.  Visual/audio telehealth visit, UTA vital signs.   See social history for additional risk factors.    Cardiac Risk Factors include: advanced age (>49men, >47 women);hypertension;male gender;sedentary lifestyle  Sleep patterns: Getting on average 8-12 hours of sleep a night. Wakes up 1-2 times a night to void. Wakes up and feels sluggish.    Home Safety/Smoke Alarms: Feels safe in home. Smoke alarms in place.  Living environment; Lives with wife in a 1 story home and stairs outside house have handrail on them. Shower is a step over tub combo and grab bars in place. Seat Belt Safety/Bike Helmet: Wears seat belt.   Male:   CCS- UTD    PSA- ordered Lab Results  Component Value Date   PSA 1.1 11/26/2017   PSA 1.0 05/25/2016   PSA 1.56 04/10/2011       Objective:    Today's Vitals   07/17/19 1504  BP: 110/68  Pulse: 87  Temp: 97.8 F (36.6 C)  TempSrc: Oral  Weight: 185 lb (83.9 kg)  Height: 5' 3.5" (1.613 m)  PainSc: 4    Body mass index is 32.26 kg/m.  Advanced Directives 07/17/2019 11/02/2014 12/19/2013 11/21/2013 11/01/2013  Does Patient Have a Medical Advance Directive? No No Patient does not have advance directive;Patient would not like information Patient does not have advance directive;Patient would not like information Patient does not have advance directive;Patient would like information  Would patient like information on creating a medical advance directive? No - Patient declined Yes - Educational materials given - - -    Current Medications (verified) Outpatient Encounter Medications as of 07/17/2019  Medication Sig  . albuterol (VENTOLIN HFA) 108 (90 Base) MCG/ACT inhaler Inhale 2 puffs into the lungs every 6 (six) hours as needed.  . ALPRAZolam (XANAX) 0.5 MG tablet Take 1 tablet (0.5 mg  total) by mouth 2 (two) times daily as needed. for anxiety  . AMBULATORY NON FORMULARY MEDICATION Nebulizer machine and supplies for wheezing/retractions/asthma exacerbations.  Marland Kitchen amLODipine (NORVASC) 10 MG tablet Take 1 tablet (10 mg total) by mouth daily.  . budesonide-formoterol (SYMBICORT) 160-4.5 MCG/ACT inhaler Inhale 2 puffs into the lungs 2 (two) times daily.  . clopidogrel (PLAVIX) 75 MG tablet Take 75 mg by mouth daily.  . cyclobenzaprine (FLEXERIL) 10 MG tablet Take 1 tablet (10 mg total) by mouth 3 (three) times daily as needed for muscle spasms.  Marland Kitchen doxazosin (CARDURA) 4 MG tablet Take 1 tablet (4 mg total) by mouth daily. (Patient taking differently: Take 4 mg by mouth 2 (two) times daily. )  . furosemide (LASIX) 20 MG tablet Take 1 tablet (20 mg total) by mouth daily.  . hydrALAZINE (APRESOLINE) 25 MG tablet Take 1 tablet (25 mg total) by mouth 3 (three) times daily.  Marland Kitchen ibuprofen (ADVIL) 600 MG tablet Take 600 mg by mouth every 6 (six) hours as needed.  Marland Kitchen ipratropium-albuterol (DUONEB) 0.5-2.5 (3) MG/3ML SOLN Take 3 mLs by nebulization every 2 (two) hours as needed.  Marland Kitchen levocetirizine (XYZAL) 5 MG tablet Take 1 tablet (5 mg total) by mouth daily.  Marland Kitchen losartan (COZAAR) 50 MG tablet Take 1 tablet (50 mg total) by mouth daily.  . metoprolol succinate (TOPROL-XL) 100 MG 24 hr tablet Take 100 mg by mouth daily. Take with or immediately following a meal.  .  oxymetazoline (AFRIN SINUS) 0.05 % nasal spray Place 1 spray into both nostrils 2 (two) times daily. Only for 3 days.  . Pitavastatin Calcium (LIVALO) 1 MG TABS Take one tablet Monday, Wednesday, Friday.  . traZODone (DESYREL) 50 MG tablet Take 1 tablet (50 mg total) by mouth at bedtime.  . triamcinolone cream (KENALOG) 0.1 % Apply 1 application topically 2 (two) times daily.  Marland Kitchen umeclidinium bromide (INCRUSE ELLIPTA) 62.5 MCG/INH AEPB Inhale 1 puff into the lungs daily.   No facility-administered encounter medications on file as of  07/17/2019.    Allergies (verified) Cialis [tadalafil], Lipitor [atorvastatin], Niacin and related, and Pravachol [pravastatin]   History: Past Medical History:  Diagnosis Date  . Allergy   . Anxiety   . Arthritis   . COPD (chronic obstructive pulmonary disease) ([place])   . Hyperlipidemia   . Hypertension    Past Surgical History:  Procedure Laterality Date  . FOOT SURGERY     Family History  Problem Relation Age of Onset  . Diabetes Mother   . Hypertension Mother   . Alzheimer's disease Mother   . CAD Mother   . Heart disease Mother   . Hyperlipidemia Mother   . Diabetes Father   . Hypertension Father   . Cancer Brother   . Peripheral vascular disease Brother   . Cancer Daughter    Social History   Socioeconomic History  . Marital status: Married    Spouse name: Hattie  . Number of children: 7  . Years of education: 53  . Highest education level: Some college, no degree  Occupational History    Comment: Retired  Tobacco Use  . Smoking status: Former Smoker    Packs/day: 0.50    Years: 40.00    Pack years: 20.00    Types: Cigarettes    Quit date: 12/15/2014    Years since quitting: 4.5  . Smokeless tobacco: Never Used  Substance and Sexual Activity  . Alcohol use: Not Currently    Alcohol/week: 0.0 standard drinks  . Drug use: No  . Sexual activity: Yes  Other Topics Concern  . Not on file  Social History Narrative  . Not on file   Social Determinants of Health   Financial Resource Strain:   . Difficulty of Paying Living Expenses: Not on file  Food Insecurity:   . Worried About Charity fundraiser in the Last Year: Not on file  . Ran Out of Food in the Last Year: Not on file  Transportation Needs:   . Lack of Transportation (Medical): Not on file  . Lack of Transportation (Non-Medical): Not on file  Physical Activity:   . Days of Exercise per Week: Not on file  . Minutes of Exercise per Session: Not on file  Stress:   . Feeling of Stress : Not on  file  Social Connections:   . Frequency of Communication with Friends and Family: Not on file  . Frequency of Social Gatherings with Friends and Family: Not on file  . Attends Religious Services: Not on file  . Active Member of Clubs or Organizations: Not on file  . Attends Archivist Meetings: Not on file  . Marital Status: Not on file   Tobacco Counseling Counseling given: Not Answered   Clinical Intake:  Pre-visit preparation completed: Yes  Pain : 0-10 Pain Score: 4  Pain Type: Chronic pain Pain Location: Foot Pain Orientation: Right, Left Pain Descriptors / Indicators: Aching, Throbbing Pain Onset: 1 to 4  weeks ago Pain Frequency: Intermittent Pain Relieving Factors: nothing- aleve and TENS unit no help Effect of Pain on Daily Activities: hurts to walk  Pain Relieving Factors: nothing- aleve and TENS unit no help  Nutritional Risks: None Diabetes: No  How often do you need to have someone help you when you read instructions, pamphlets, or other written materials from your doctor or pharmacy?: 1 - Never What is the last grade level you completed in school?: 14  Interpreter Needed?: No  Information entered by :: Orlie Dakin, LPN  Activities of Daily Living In your present state of health, do you have any difficulty performing the following activities: 07/17/2019  Hearing? N  Vision? N  Difficulty concentrating or making decisions? Y  Comment noticing some memory loss  Walking or climbing stairs? Y  Comment due to breathing  Dressing or bathing? N  Doing errands, shopping? N  Preparing Food and eating ? N  Using the Toilet? N  In the past six months, have you accidently leaked urine? N  Do you have problems with loss of bowel control? N  Managing your Medications? N  Managing your Finances? N  Housekeeping or managing your Housekeeping? N  Some recent data might be hidden     Immunizations and Health Maintenance  There is no immunization history  on file for this patient. Health Maintenance Due  Topic Date Due  . Hepatitis C Screening  [birth year]  . COLONOSCOPY  09/12/2001  . PNA vac Low Risk Adult (1 of 2 - PCV13) 09/12/2016    Patient Care Team: Lavada Mesi as PCP - General (Family Medicine) Stanford Breed Denice Bors, MD as Referring Physician (Cardiology)  Indicate any recent Medical Services you may have received from other than [facility] providers in the past year (date may be approximate).    Assessment:   This is a routine wellness examination for Chanden Steagall.Physical assessment deferred to PCP.   Hearing/Vision screen  Hearing Screening   125Hz  250Hz  500Hz  1000Hz  2000Hz  3000Hz  4000Hz  6000Hz  8000Hz   Right ear:           Left ear:           Comments: Hearing test not performed due to visit done via telephone due to [place] Comments: Vision test not performed due to visit done via telephone due to [place] issues and exercise activities discussed: Current Exercise Habits: The patient does not participate in regular exercise at present, Exercise limited by: respiratory conditions(s) Diet Eats a healthy diet of fruits and vegetables and proteins Breakfast: oatmeal with raisens and apples Lunch:Meat and vegetables  Dinner: meat and vegetables or salads     Drinks water dailly  Goals    . Patient Stated     Patient states would like to improve his memory and increase walking.      Depression Screen PHQ 2/9 Scores 07/17/2019 11/26/2017 05/03/2017 09/02/2016  PHQ - 2 Score 0 0 0 0  PHQ- 9 Score - 2 - -    Fall Risk Fall Risk  07/17/2019  Falls in the past year? 1  Number falls in past yr: 0  Injury with Fall? 0  Risk for fall due to : No Fall Risks  Follow up Falls prevention discussed    Is the patient's home free of loose throw rugs in walkways, pet beds, electrical cords, etc?   yes      Grab bars in the bathroom? yes      Handrails  on the stairs?   yes      Adequate lighting?    yes  Cognitive Function:     6CIT Screen 07/17/2019  What Year? 0 points  What month? 0 points  What time? 0 points  Count back from 20 0 points  Months in reverse 0 points  Repeat phrase 2 points  Total Score 2    Screening Tests Health Maintenance  Topic Date Due  . Hepatitis C Screening  April 22, 1951  . COLONOSCOPY  09/12/2001  . PNA vac Low Risk Adult (1 of 2 - PCV13) 09/12/2016  . TETANUS/TDAP  05/25/2026 (Originally 09/13/1970)  . INFLUENZA VACCINE  Discontinued      Plan:    Please schedule your next medicare wellness visit with me in 1 yr.  Mr. Kalafut , Thank you for taking time to come for your Medicare Wellness Visit. I appreciate your ongoing commitment to your health goals. Please review the following plan we discussed and let me know if I can assist you in the future.  Continue doing brain stimulating activities (puzzles, reading, adult coloring books, staying active) to keep memory sharp.   Advissed patient to schedule appointment to see [facility] for breathing difficulty. Sent to schedule appointment.  These are the goals we discussed: Goals    . Patient Stated     Patient states would like to improve his memory and increase walking.       This is a list of the screening recommended for you and due dates:  Health Maintenance  Topic Date Due  .  Hepatitis C: One time screening is recommended by Center for Disease Control  (CDC) for  adults born from [birth year] through 1964.   Jul 08, 1950  . Colon Cancer Screening  09/12/2001  . Pneumonia vaccines (1 of 2 - PCV13) 09/12/2016  . Tetanus Vaccine  05/25/2026*  . Flu Shot  Discontinued  *Topic was postponed. The date shown is not the original due date.     I have personally reviewed and noted the following in the patient's chart:   . Medical and social history . Use of alcohol, tobacco or illicit drugs  . Current medications and supplements . Functional ability and status . Nutritional status . Physical  activity . Advanced directives . List of other physicians . Hospitalizations, surgeries, and ER visits in previous 12 months . Vitals . Screenings to include cognitive, depression, and falls . Referrals and appointments  In addition, I have reviewed and discussed with patient certain preventive protocols, quality metrics, and best practice recommendations. A written personalized care plan for preventive services as well as general preventive health recommendations were provided to patient.     Joanne Chars, LPN   [id number]

## 2019-07-17 ENCOUNTER — Ambulatory Visit (INDEPENDENT_AMBULATORY_CARE_PROVIDER_SITE_OTHER): Payer: Medicare HMO | Admitting: *Deleted

## 2019-07-17 VITALS — BP 110/68 | HR 87 | Temp 97.8°F | Ht 63.5 in | Wt 185.0 lb

## 2019-07-17 DIAGNOSIS — Z0000 Encounter for general adult medical examination without abnormal findings: Secondary | ICD-10-CM

## 2019-07-17 DIAGNOSIS — R351 Nocturia: Secondary | ICD-10-CM

## 2019-07-17 DIAGNOSIS — Z125 Encounter for screening for malignant neoplasm of prostate: Secondary | ICD-10-CM

## 2019-07-17 NOTE — Patient Instructions (Signed)
Please schedule your next medicare wellness visit with me in 1 yr.  Travis Barajas , Thank you for taking time to come for your Medicare Wellness Visit. I appreciate your ongoing commitment to your health goals. Please review the following plan we discussed and let me know if I can assist you in the future.  Continue doing brain stimulating activities (puzzles, reading, adult coloring books, staying active) to keep memory sharp.   These are the goals we discussed: Goals    . Patient Stated     Patient states would like to improve his memory and increase walking.

## 2019-07-19 ENCOUNTER — Ambulatory Visit (INDEPENDENT_AMBULATORY_CARE_PROVIDER_SITE_OTHER): Payer: Medicare HMO | Admitting: Physician Assistant

## 2019-07-19 VITALS — BP 150/72 | HR 92 | Temp 98.2°F | Wt 184.0 lb

## 2019-07-19 DIAGNOSIS — J441 Chronic obstructive pulmonary disease with (acute) exacerbation: Secondary | ICD-10-CM

## 2019-07-19 DIAGNOSIS — I739 Peripheral vascular disease, unspecified: Secondary | ICD-10-CM

## 2019-07-19 DIAGNOSIS — M79652 Pain in left thigh: Secondary | ICD-10-CM | POA: Diagnosis not present

## 2019-07-19 DIAGNOSIS — Z125 Encounter for screening for malignant neoplasm of prostate: Secondary | ICD-10-CM | POA: Diagnosis not present

## 2019-07-19 DIAGNOSIS — R062 Wheezing: Secondary | ICD-10-CM

## 2019-07-19 DIAGNOSIS — M5442 Lumbago with sciatica, left side: Secondary | ICD-10-CM

## 2019-07-19 DIAGNOSIS — R351 Nocturia: Secondary | ICD-10-CM | POA: Diagnosis not present

## 2019-07-19 LAB — PSA: PSA: 1.6 ng/mL (ref <=4.0)

## 2019-07-19 MED ORDER — PREDNISONE 20 MG PO TABS: ORAL_TABLET | ORAL | 0 refills | Status: DC

## 2019-07-19 MED ORDER — BUDESONIDE-FORMOTEROL FUMARATE 160-4.5 MCG/ACT IN AERO: 2.0000 | INHALATION_SPRAY | Freq: Two times a day (BID) | RESPIRATORY_TRACT | 5 refills | Status: DC

## 2019-07-19 NOTE — Progress Notes (Signed)
Subjective:    Patient ID: Travis Barajas, male    DOB: [birth year], 69 y.o.   MRN: [id number]  HPI  Pt is a 69 yo male with recent CVA, PVD, HLD, HTN, COPD who presents to the clinic with multiple concerns.   He continues to have problems breathing. He is taking incruse daily and supposed to take symbicort daily but he ran out about a week ago. He can get to a place where he feels a little better and can breathe more but then quickly can go down hill. Right now using duoneb 3-4 times a day. No fever, chills, sinus pressure. He has chronic cough. Very easily out of breath. Sleep study march 3rd.   He is also having a lot of pain in left upper leg/thigh. Hx of claudication and suggested a stent at one point but he declined. He can walk very little distances before his left leg starts to cramp. He is also having some left lower back pain intermittently but seems to resolve with rest and muscle relaxer.  .. Active Ambulatory Problems    Diagnosis Date Noted  . GERD (gastroesophageal reflux disease) 07/23/2011  . Allergic rhinitis 07/23/2011  . Joint pain 07/23/2011  . HTN (hypertension), malignant 10/01/2011  . Tobacco user 12/05/2011  . Dyspnea 10/18/2012  . Hematuria 11/07/2013  . Hyperlipidemia 11/17/2013  . BPH (benign prostatic hyperplasia) 11/21/2013  . COPD, moderate ([place]) 03/21/2014  . Erectile dysfunction 03/21/2014  . Claudication ([place]) 09/26/2014  . Generalized anxiety disorder 01/15/2015  . BPH without urinary obstruction 05/25/2016  . Acute nonseasonal allergic rhinitis due to pollen 05/04/2017  . Dyslipidemia 05/04/2017  . Current smoker 09/01/2017  . Hemoptysis 09/01/2017  . Left carotid bruit 09/01/2017  . Systolic murmur [id number]  . Trouble in sleeping 10/05/2018  . PVD (peripheral vascular disease) with claudication ([place]) 02/22/2019  . Thrombocytopenia ([place]) 02/24/2019  . Slurred speech 02/24/2019  . Dizziness 02/24/2019  . Dysphagia 02/24/2019  . TIA  (transient ischemic attack) 02/24/2019  . Intercostal retractions 03/31/2019  . Cerebrovascular accident ([place]) 04/03/2019  . Distended abdomen 04/14/2019  . Uncontrolled hypertension 04/14/2019  . 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 Renal cyst, left 04/26/2019  . Gallstone 04/26/2019  . Lower extremity edema 06/15/2019  . Anxiety 06/15/2019  . Wheezing 07/24/2019  . COPD with acute exacerbation ([place]) 07/24/2019  . Left thigh pain 07/24/2019  . Acute left-sided low back pain with left-sided sciatica 07/24/2019   Resolved Ambulatory Problems    Diagnosis Date Noted  . Pneumonia of left lower lobe due to infectious organism 04/03/2019   Past Medical History:  Diagnosis Date  . Allergy   . Arthritis   . COPD (chronic obstructive pulmonary disease) ([place])   . Hypertension          Review of Systems See HPI.     Objective:   Physical Exam Vitals reviewed.  Constitutional:      Appearance: Normal appearance.  HENT:     Right Ear: Tympanic membrane normal.     Left Ear: Tympanic membrane normal.     Nose: Nose normal.     Mouth/Throat:     Mouth: Mucous membranes are moist.  Eyes:     Pupils: Pupils are equal, round, and reactive to light.  Cardiovascular:     Rate and Rhythm: Normal rate and regular rhythm.     Heart sounds: Murmur present.  Pulmonary:     Comments: Wheezing throughout lungs and heard without auscultation.  Labored shorter breaths.  Musculoskeletal:     Comments: Pain with walking in left thigh.  NROM at waist.  No lumbar tenderness to palpation.   Neurological:     Mental Status: He is alert and oriented to person, place, and time.  Psychiatric:        Mood and Affect: Mood normal.           Assessment & Plan:  Marland KitchenMarland KitchenDiagnoses and all orders for this visit:  COPD with acute exacerbation ([place]) -     predniSONE (DELTASONE) 20 MG tablet; Take 3 tablets for 3 days, take 2 tablets for 3 days, take 1 tablet for 3 days, take 1/2 tablet  for 4 days. -     CT Chest Wo Contrast  Acute left-sided low back pain with left-sided sciatica -     DG Lumbar Spine Complete  Wheezing -     budesonide-formoterol (SYMBICORT) 160-4.5 MCG/ACT inhaler; Inhale 2 puffs into the lungs 2 (two) times daily. -     CT Chest Wo Contrast  Left thigh pain -     Ambulatory referral to Vascular Surgery  PVD (peripheral vascular disease) with claudication ([place]) -     Ambulatory referral to Vascular Surgery   Started 6 minute walk test. Pulse ox stayed good but pain in left thigh made him stop.  Will send to vascular.  On statin.  I suspect more claudication than from low back. If no improvement or intervention follow up with sports medicine.   COPD exacerbation.  Ordered CT.  Stopped smoking.  Certainly being out of symbicort did not help. Restart with incruse.  Continue duo neb regularly.  Prednisone taper sent.  Consider pulmonology referral if he is not quickly going back to baseline and staying there.

## 2019-07-19 NOTE — Patient Instructions (Addendum)
CT chest will order.  Ordered lumbar xray.  Start back on symbicort.

## 2019-07-20 NOTE — Progress Notes (Signed)
No significant change in PSA.

## 2019-07-21 ENCOUNTER — Other Ambulatory Visit: Payer: Self-pay

## 2019-07-21 DIAGNOSIS — E785 Hyperlipidemia, unspecified: Secondary | ICD-10-CM

## 2019-07-21 DIAGNOSIS — I6339 Cerebral infarction due to thrombosis of other cerebral artery: Secondary | ICD-10-CM

## 2019-07-21 DIAGNOSIS — I10 Essential (primary) hypertension: Secondary | ICD-10-CM

## 2019-07-21 MED ORDER — LOSARTAN POTASSIUM 50 MG PO TABS: 50.0000 mg | ORAL_TABLET | Freq: Every day | ORAL | 2 refills | Status: DC

## 2019-07-21 MED ORDER — LIVALO 1 MG PO TABS: ORAL_TABLET | ORAL | 2 refills | Status: DC

## 2019-07-24 ENCOUNTER — Encounter: Payer: Self-pay | Admitting: Physician Assistant

## 2019-07-24 DIAGNOSIS — R062 Wheezing: Secondary | ICD-10-CM | POA: Insufficient documentation

## 2019-07-24 DIAGNOSIS — M5442 Lumbago with sciatica, left side: Secondary | ICD-10-CM | POA: Insufficient documentation

## 2019-07-24 DIAGNOSIS — M79652 Pain in left thigh: Secondary | ICD-10-CM | POA: Insufficient documentation

## 2019-07-24 DIAGNOSIS — J441 Chronic obstructive pulmonary disease with (acute) exacerbation: Secondary | ICD-10-CM | POA: Insufficient documentation

## 2019-07-28 ENCOUNTER — Other Ambulatory Visit: Payer: Self-pay

## 2019-07-28 ENCOUNTER — Ambulatory Visit (INDEPENDENT_AMBULATORY_CARE_PROVIDER_SITE_OTHER): Payer: Medicare HMO

## 2019-07-28 ENCOUNTER — Other Ambulatory Visit: Payer: Medicare HMO

## 2019-07-28 DIAGNOSIS — J441 Chronic obstructive pulmonary disease with (acute) exacerbation: Secondary | ICD-10-CM

## 2019-07-28 DIAGNOSIS — R062 Wheezing: Secondary | ICD-10-CM | POA: Diagnosis not present

## 2019-07-28 DIAGNOSIS — M5442 Lumbago with sciatica, left side: Secondary | ICD-10-CM

## 2019-07-28 DIAGNOSIS — J9811 Atelectasis: Secondary | ICD-10-CM | POA: Diagnosis not present

## 2019-07-28 DIAGNOSIS — M545 Low back pain: Secondary | ICD-10-CM | POA: Diagnosis not present

## 2019-07-31 ENCOUNTER — Telehealth: Payer: Self-pay | Admitting: Physician Assistant

## 2019-07-31 ENCOUNTER — Encounter: Payer: Self-pay | Admitting: Physician Assistant

## 2019-07-31 DIAGNOSIS — N400 Enlarged prostate without lower urinary tract symptoms: Secondary | ICD-10-CM

## 2019-07-31 DIAGNOSIS — R918 Other nonspecific abnormal finding of lung field: Secondary | ICD-10-CM | POA: Insufficient documentation

## 2019-07-31 MED ORDER — DOXAZOSIN MESYLATE 4 MG PO TABS: 4.0000 mg | ORAL_TABLET | Freq: Two times a day (BID) | ORAL | 1 refills | Status: DC

## 2019-07-31 NOTE — Telephone Encounter (Signed)
Patient's wife states Cardura increased to twice daily and they need new RX. I couldn't find documentation of increase, please verify before I send.

## 2019-07-31 NOTE — Progress Notes (Signed)
Bilateral pulmonary nodules but no acute findings. Will recheck in 3-6 months to see if stable.

## 2019-07-31 NOTE — Progress Notes (Signed)
Some minimal arthritis of spine but I do not think what is causing your leg pain. Referral made for vascular.

## 2019-07-31 NOTE — Telephone Encounter (Signed)
Received fax for Livalo sent through cover my meds waiting on determination. - CF

## 2019-08-01 ENCOUNTER — Telehealth: Payer: Self-pay | Admitting: Physician Assistant

## 2019-08-01 NOTE — Telephone Encounter (Signed)
Received fax from [facility] and they approved Livalo tablets.   Valid 06/16/2019 - 06/14/2020 - CF

## 2019-08-08 ENCOUNTER — Telehealth: Payer: Self-pay | Admitting: Neurology

## 2019-08-08 NOTE — Telephone Encounter (Signed)
Patient's wife brought recent BP readings  07/31/2019: BP 146/77 P 71 08/01/2019: BP 144/71 P 88 08/02/2019: BP 144/71 P 80 08/03/2019: BP 130/71 P 78 08/04/2019: BP 158/77 P 80 08/05/2019: BP 77/43 P 82 08/06/2019: BP 146/77 P 71 08/07/2019: BP 144/71 P 88

## 2019-08-09 DIAGNOSIS — J449 Chronic obstructive pulmonary disease, unspecified: Secondary | ICD-10-CM | POA: Diagnosis not present

## 2019-08-09 DIAGNOSIS — I351 Nonrheumatic aortic (valve) insufficiency: Secondary | ICD-10-CM | POA: Diagnosis not present

## 2019-08-09 DIAGNOSIS — R002 Palpitations: Secondary | ICD-10-CM | POA: Diagnosis not present

## 2019-08-09 DIAGNOSIS — I10 Essential (primary) hypertension: Secondary | ICD-10-CM | POA: Diagnosis not present

## 2019-08-09 DIAGNOSIS — I639 Cerebral infarction, unspecified: Secondary | ICD-10-CM | POA: Diagnosis not present

## 2019-08-09 DIAGNOSIS — E785 Hyperlipidemia, unspecified: Secondary | ICD-10-CM | POA: Diagnosis not present

## 2019-08-10 ENCOUNTER — Telehealth: Payer: Self-pay | Admitting: Physician Assistant

## 2019-08-10 NOTE — Telephone Encounter (Signed)
Patient wife calls and states she has a cough- some productive cough. No fever,chills,some fatigue, no headache, no sore throat, no exposure to anyone with COVID. Given honey and lemon tea without much help. Tried alka seltzer with some help. Please advise.  Cough started few days ago and gotten worse, some slight wheezing. KG LPN

## 2019-08-11 ENCOUNTER — Other Ambulatory Visit: Payer: Self-pay | Admitting: *Deleted

## 2019-08-11 DIAGNOSIS — R062 Wheezing: Secondary | ICD-10-CM

## 2019-08-11 MED ORDER — BUDESONIDE-FORMOTEROL FUMARATE 160-4.5 MCG/ACT IN AERO: 2.0000 | INHALATION_SPRAY | Freq: Two times a day (BID) | RESPIRATORY_TRACT | 5 refills | Status: DC

## 2019-08-11 NOTE — Telephone Encounter (Signed)
Called wife and notified of PA instructions. Wife verbally read back orders and understands plan of care. KG LPN

## 2019-08-11 NOTE — Telephone Encounter (Signed)
Make sure taking mucinex 600mg  bid.  Delsym for cough at night.  Nebulizer every 2-3 hours.  Taking symbicort bid and incruse qd.  Use humidfer in room.

## 2019-08-15 ENCOUNTER — Telehealth: Payer: Self-pay

## 2019-08-15 DIAGNOSIS — I639 Cerebral infarction, unspecified: Secondary | ICD-10-CM | POA: Diagnosis not present

## 2019-08-15 MED ORDER — ZYPITAMAG 2 MG PO TABS: 1.0000 | ORAL_TABLET | Freq: Every day | ORAL | 1 refills | Status: DC

## 2019-08-15 MED ORDER — PITAVASTATIN CALCIUM 2 MG PO TABS: 1.0000 | ORAL_TABLET | Freq: Every day | ORAL | 1 refills | Status: DC

## 2019-08-15 MED ORDER — ZYPITAMAG 1 MG PO TABS: 1.0000 mg | ORAL_TABLET | Freq: Every day | ORAL | 3 refills | Status: DC

## 2019-08-15 NOTE — Addendum Note (Signed)
Addended byAnnamaria Helling on: 08/15/2019 01:07 PM   Modules accepted: Orders

## 2019-08-15 NOTE — Telephone Encounter (Signed)
[facility] may deliver as well.  I would love for him to take daily if he can tolerate.

## 2019-08-15 NOTE — Addendum Note (Signed)
Addended by: Donella Stade on: 08/15/2019 01:12 PM   Modules accepted: Orders

## 2019-08-15 NOTE — Addendum Note (Signed)
Addended byAnnamaria Helling on: 08/15/2019 03:43 PM   Modules accepted: Orders

## 2019-08-15 NOTE — Telephone Encounter (Signed)
Livalo pitavastatin cost too much.  [facility] now offers Zypitamag (pitavastatin) tablets for just $1 per day. His wife states she wouldn't mind going to [facility] for a 90 day. Also she wanted to know if he should take it daily instead of 3 days a week.

## 2019-08-15 NOTE — Telephone Encounter (Signed)
[facility] called and states 1 mg not available. They wondered if we can write for 2 mg and have patient take half? Please advise. RX pended.

## 2019-08-15 NOTE — Telephone Encounter (Signed)
Patient's wife advised of recommendations.  

## 2019-08-15 NOTE — Telephone Encounter (Signed)
He can take the whole. Tablet. Sent.

## 2019-08-17 ENCOUNTER — Ambulatory Visit: Payer: Medicare HMO | Admitting: Pulmonary Disease

## 2019-08-17 ENCOUNTER — Other Ambulatory Visit: Payer: Self-pay

## 2019-08-17 ENCOUNTER — Telehealth: Payer: Self-pay | Admitting: Pulmonary Disease

## 2019-08-17 ENCOUNTER — Encounter: Payer: Self-pay | Admitting: Pulmonary Disease

## 2019-08-17 VITALS — BP 158/70 | HR 86 | Temp 97.9°F | Ht 63.0 in | Wt 186.0 lb

## 2019-08-17 DIAGNOSIS — R0609 Other forms of dyspnea: Secondary | ICD-10-CM

## 2019-08-17 DIAGNOSIS — R0600 Dyspnea, unspecified: Secondary | ICD-10-CM

## 2019-08-17 DIAGNOSIS — Z720 Tobacco use: Secondary | ICD-10-CM

## 2019-08-17 DIAGNOSIS — R0602 Shortness of breath: Secondary | ICD-10-CM | POA: Diagnosis not present

## 2019-08-17 DIAGNOSIS — R061 Stridor: Secondary | ICD-10-CM

## 2019-08-17 DIAGNOSIS — R062 Wheezing: Secondary | ICD-10-CM | POA: Diagnosis not present

## 2019-08-17 NOTE — Progress Notes (Signed)
Synopsis: Referred in March 2021 for self-referral for COPD, PCP: Donella Stade, PA-C  Subjective:   PATIENT ID: Travis Barajas GENDER: male DOB: 1951/01/28, MRN: [id number]  Chief Complaint  Patient presents with  . Consult    Patient is here to establish care for COPD. Patient states his breathing has got worse. Patient has shortness of breath with exertion. Patient had a stroke in Sept of last year and has been on prednisone since then. Patient had a CT scan and they found nodules on his lungs.     This is a 69 year old gentleman past medical history of hypertension hyperlipidemia, labeled with diagnosis of COPD, no PFTs on file.  Patient treated for a COPD exacerbation in February 2021 with steroids from primary care.  Currently managed with Symbicort.  Patient was also sent for CT imaging of the chest.  CT imaging of the chest also revealed bilateral pulmonary nodules largest 6 mm in size in the right middle lobe.  Patient also had emergency room visit in January 2021. This was at [facility] 08/17/2019: Patient here today with complaints of ongoing expiratory sounds from his neck.  This has been going on for several months.  With slow respirations in the office he is able to control this and make this dissipate however if he breathes normally he will have expiratory stridorous sounds.  Which she reviewed been referring to as wheezing.  He does not really have any chest tightness.  He has used steroids and albuterol and inhalers none of which has made this go away.  He does state he has been using a humidifier at home to keep the air moist and if he breathes slow he can feel less dyspneic on exertion.  Patient denies hemoptysis.  His longstanding history of smoking.  Patient started smoking greater than 40 years ago.  He quit this past September when he had his first stroke.   Past Medical History:  Diagnosis Date  . Allergy   . Anxiety   . Arthritis   . COPD (chronic  obstructive pulmonary disease) ([place])   . Hyperlipidemia   . Hypertension      Family History  Problem Relation Age of Onset  . Diabetes Mother   . Hypertension Mother   . Alzheimer's disease Mother   . CAD Mother   . Heart disease Mother   . Hyperlipidemia Mother   . Diabetes Father   . Hypertension Father   . Cancer Brother   . Peripheral vascular disease Brother   . Cancer Daughter      Past Surgical History:  Procedure Laterality Date  . FOOT SURGERY      Social History   Socioeconomic History  . Marital status: Married    Spouse name: Hattie  . Number of children: 7  . Years of education: 56  . Highest education level: Some college, no degree  Occupational History    Comment: Retired  Tobacco Use  . Smoking status: Former Smoker    Packs/day: 0.50    Years: 40.00    Pack years: 20.00    Types: Cigarettes    Quit date: 02/15/2019    Years since quitting: 0.5  . Smokeless tobacco: Never Used  Substance and Sexual Activity  . Alcohol use: Not Currently    Alcohol/week: 0.0 standard drinks  . Drug use: No  . Sexual activity: Yes  Other Topics Concern  . Not on file  Social History Narrative  . Not on  file   Social Determinants of Health   Financial Resource Strain:   . Difficulty of Paying Living Expenses: Not on file  Food Insecurity:   . Worried About Charity fundraiser in the Last Year: Not on file  . Ran Out of Food in the Last Year: Not on file  Transportation Needs:   . Lack of Transportation (Medical): Not on file  . Lack of Transportation (Non-Medical): Not on file  Physical Activity:   . Days of Exercise per Week: Not on file  . Minutes of Exercise per Session: Not on file  Stress:   . Feeling of Stress : Not on file  Social Connections:   . Frequency of Communication with Friends and Family: Not on file  . Frequency of Social Gatherings with Friends and Family: Not on file  . Attends Religious Services: Not on file  . Active Member of  Clubs or Organizations: Not on file  . Attends Archivist Meetings: Not on file  . Marital Status: Not on file  Intimate Partner Violence:   . Fear of Current or Ex-Partner: Not on file  . Emotionally Abused: Not on file  . Physically Abused: Not on file  . Sexually Abused: Not on file     Allergies  Allergen Reactions  . Cialis [Tadalafil]     Hallucinations   . Lipitor [Atorvastatin]     itching  . Niacin And Related Itching  . Pravachol [Pravastatin] Itching    Itching.      Outpatient Medications Prior to Visit  Medication Sig Dispense Refill  . albuterol (VENTOLIN HFA) 108 (90 Base) MCG/ACT inhaler Inhale 2 puffs into the lungs every 6 (six) hours as needed. 16 g 1  . ALPRAZolam (XANAX) 0.5 MG tablet Take 1 tablet (0.5 mg total) by mouth 2 (two) times daily as needed. for anxiety 30 tablet 0  . AMBULATORY NON FORMULARY MEDICATION Nebulizer machine and supplies for wheezing/retractions/asthma exacerbations. 1 Device 0  . amLODipine (NORVASC) 10 MG tablet Take 1 tablet (10 mg total) by mouth daily. 90 tablet 1  . budesonide-formoterol (SYMBICORT) 160-4.5 MCG/ACT inhaler Inhale 2 puffs into the lungs 2 (two) times daily. 1 Inhaler 5  . clopidogrel (PLAVIX) 75 MG tablet Take 75 mg by mouth daily.    . cyclobenzaprine (FLEXERIL) 10 MG tablet Take 1 tablet (10 mg total) by mouth 3 (three) times daily as needed for muscle spasms. 30 tablet 0  . doxazosin (CARDURA) 4 MG tablet Take 1 tablet (4 mg total) by mouth 2 (two) times daily. 180 tablet 1  . furosemide (LASIX) 20 MG tablet Take 1 tablet (20 mg total) by mouth daily. 30 tablet 5  . hydrALAZINE (APRESOLINE) 25 MG tablet Take 1 tablet (25 mg total) by mouth 3 (three) times daily. 90 tablet 5  . ibuprofen (ADVIL) 600 MG tablet Take 600 mg by mouth every 6 (six) hours as needed.    Marland Kitchen ipratropium-albuterol (DUONEB) 0.5-2.5 (3) MG/3ML SOLN Take 3 mLs by nebulization every 2 (two) hours as needed. 360 mL 0  . levocetirizine  (XYZAL) 5 MG tablet Take 1 tablet (5 mg total) by mouth daily. 90 tablet 3  . losartan (COZAAR) 50 MG tablet Take 1 tablet (50 mg total) by mouth daily. 30 tablet 2  . metoprolol succinate (TOPROL-XL) 100 MG 24 hr tablet Take 100 mg by mouth daily. Take with or immediately following a meal.    . oxymetazoline (AFRIN SINUS) 0.05 % nasal spray Place 1  spray into both nostrils 2 (two) times daily. Only for 3 days. 30 mL 0  . Pitavastatin Magnesium (ZYPITAMAG) 2 MG TABS Take 1 tablet by mouth daily. 90 tablet 1  . predniSONE (DELTASONE) 20 MG tablet Take 3 tablets for 3 days, take 2 tablets for 3 days, take 1 tablet for 3 days, take 1/2 tablet for 4 days. 21 tablet 0  . traZODone (DESYREL) 50 MG tablet Take 1 tablet (50 mg total) by mouth at bedtime. 30 tablet 5  . triamcinolone cream (KENALOG) 0.1 % Apply 1 application topically 2 (two) times daily. 60 g 0  . umeclidinium bromide (INCRUSE ELLIPTA) 62.5 MCG/INH AEPB Inhale 1 puff into the lungs daily. 30 each 5   No facility-administered medications prior to visit.    Review of Systems  Constitutional: Negative for chills, fever, malaise/fatigue and weight loss.  HENT: Negative for hearing loss, sore throat and tinnitus.   Eyes: Negative for blurred vision and double vision.  Respiratory: Positive for shortness of breath and wheezing. Negative for cough, hemoptysis, sputum production and stridor.        Exhaled stridor.  Cardiovascular: Negative for chest pain, palpitations, orthopnea, leg swelling and PND.  Gastrointestinal: Negative for abdominal pain, constipation, diarrhea, heartburn, nausea and vomiting.  Genitourinary: Negative for dysuria, hematuria and urgency.  Musculoskeletal: Negative for joint pain and myalgias.  Skin: Negative for itching and rash.  Neurological: Negative for dizziness, tingling, weakness and headaches.  Endo/Heme/Allergies: Negative for environmental allergies. Does not bruise/bleed easily.    Psychiatric/Behavioral: Negative for depression. The patient is not nervous/anxious and does not have insomnia.   All other systems reviewed and are negative.    Objective:  Physical Exam Vitals reviewed.  Constitutional:      General: He is not in acute distress.    Appearance: He is well-developed.  HENT:     Head: Normocephalic and atraumatic.  Eyes:     General: No scleral icterus.    Conjunctiva/sclera: Conjunctivae normal.     Pupils: Pupils are equal, round, and reactive to light.  Neck:     Vascular: No JVD.     Trachea: No tracheal deviation.     Comments: Auscultation of the neck reveals exhaled stridor Cardiovascular:     Rate and Rhythm: Normal rate and regular rhythm.     Heart sounds: Normal heart sounds. No murmur.  Pulmonary:     Effort: Pulmonary effort is normal. No tachypnea, accessory muscle usage or respiratory distress.     Breath sounds: No stridor. No wheezing, rhonchi or rales.     Comments: Exhaled stridorous sounds can be auscultated in the chest cavity but become louder as you make your way to the neck. Abdominal:     Palpations: Abdomen is soft.     Tenderness: There is no abdominal tenderness.  Musculoskeletal:        General: No tenderness.     Cervical back: Neck supple.  Lymphadenopathy:     Cervical: No cervical adenopathy.  Skin:    General: Skin is warm and dry.     Capillary Refill: Capillary refill takes less than 2 seconds.     Findings: No rash.  Neurological:     Mental Status: He is alert and oriented to person, place, and time.  Psychiatric:        Behavior: Behavior normal.      Vitals:   08/17/19 0855  BP: (!) 158/70  Pulse: 86  Temp: 97.9 F (36.6 C)  TempSrc:  Temporal  SpO2: 97%  Weight: 186 lb (84.4 kg)  Height: 5\' 3"  (1.6 m)   97% on RA BMI Readings from Last 3 Encounters:  08/17/19 32.95 kg/m  07/19/19 32.08 kg/m  07/17/19 32.26 kg/m   Wt Readings from Last 3 Encounters:  08/17/19 186 lb (84.4 kg)   07/19/19 184 lb (83.5 kg)  07/17/19 185 lb (83.9 kg)     CBC    Component Value Date/Time   WBC 8.6 02/23/2019 0927   RBC 5.22 02/23/2019 0927   HGB 14.2 02/23/2019 0927   HCT 44.2 02/23/2019 0927   PLT 113 (L) 02/23/2019 0927   MCV 84.7 02/23/2019 0927   MCV 84.1 08/24/2012 1618   MCH 27.2 02/23/2019 0927   MCHC 32.1 02/23/2019 0927   RDW 14.0 02/23/2019 0927   LYMPHSABS 2,312 11/26/2017 1352   MONOABS 0.6 01/28/2012 1025   EOSABS 519 (H) 11/26/2017 1352   BASOSABS 60 11/26/2017 1352     Chest Imaging: CT chest 07/28/2019 small pulmonary nodules right middle lobe pulmonary nodule.  Pulmonary Functions Testing Results: No flowsheet data found.     Assessment & Plan:     ICD-10-CM   1. Wheezing  R06.2 CT SOFT TISSUE NECK WO CONTRAST    Ambulatory referral to ENT    CT CHEST WO CONTRAST    Pulmonary Function Test  2. SOB (shortness of breath)  R06.02   3. DOE (dyspnea on exertion)  R06.00   4. Tobacco use  Z72.0   5. Expiratory stridor  R06.1 CT SOFT TISSUE NECK WO CONTRAST    Ambulatory referral to ENT    CT CHEST WO CONTRAST    Pulmonary Function Test    Discussion:  69 year old gentleman ongoing expiratory stridor, shortness of breath dyspnea on exertion wheezing longstanding history of tobacco abuse.  Possible diagnosis of COPD.  No prior PFTs.  Vital signs stable in the office this has been going on for several months.  Plan Following Extensive Data Review & Interpretation:  . I reviewed prior external note(s) from January 2021 emergency room visit, [facility], primary care office visit February 2021 [place], [place]. . I reviewed the result(s) of 06/22/2019 Covid antigen negative, serum creatinine 0.88, CBC otherwise unremarkable, white blood cell count 6.9 platelets 130, Covid PCR negative. . I have ordered full pulmonary function test, urgent referral to ENT.  We will try to get this scheduled as soon as possible for evaluation.  Also ordered a CT soft  tissue neck for further evaluation.  Okay to continue current inhaler regimen.  Independent interpretation of tests . Review of patient's 07/28/2019 CT chest images revealed 6 mm right middle lobe lung nodule other small bilateral pulmonary nodules.  Small area of atelectasis in the right lower lobe. The patient's images have been independently reviewed by me.      Current Outpatient Medications:  .  albuterol (VENTOLIN HFA) 108 (90 Base) MCG/ACT inhaler, Inhale 2 puffs into the lungs every 6 (six) hours as needed., Disp: 16 g, Rfl: 1 .  ALPRAZolam (XANAX) 0.5 MG tablet, Take 1 tablet (0.5 mg total) by mouth 2 (two) times daily as needed. for anxiety, Disp: 30 tablet, Rfl: 0 .  AMBULATORY NON FORMULARY MEDICATION, Nebulizer machine and supplies for wheezing/retractions/asthma exacerbations., Disp: 1 Device, Rfl: 0 .  amLODipine (NORVASC) 10 MG tablet, Take 1 tablet (10 mg total) by mouth daily., Disp: 90 tablet, Rfl: 1 .  budesonide-formoterol (SYMBICORT) 160-4.5 MCG/ACT inhaler, Inhale 2 puffs into the lungs  2 (two) times daily., Disp: 1 Inhaler, Rfl: 5 .  clopidogrel (PLAVIX) 75 MG tablet, Take 75 mg by mouth daily., Disp: , Rfl:  .  cyclobenzaprine (FLEXERIL) 10 MG tablet, Take 1 tablet (10 mg total) by mouth 3 (three) times daily as needed for muscle spasms., Disp: 30 tablet, Rfl: 0 .  doxazosin (CARDURA) 4 MG tablet, Take 1 tablet (4 mg total) by mouth 2 (two) times daily., Disp: 180 tablet, Rfl: 1 .  furosemide (LASIX) 20 MG tablet, Take 1 tablet (20 mg total) by mouth daily., Disp: 30 tablet, Rfl: 5 .  hydrALAZINE (APRESOLINE) 25 MG tablet, Take 1 tablet (25 mg total) by mouth 3 (three) times daily., Disp: 90 tablet, Rfl: 5 .  ibuprofen (ADVIL) 600 MG tablet, Take 600 mg by mouth every 6 (six) hours as needed., Disp: , Rfl:  .  ipratropium-albuterol (DUONEB) 0.5-2.5 (3) MG/3ML SOLN, Take 3 mLs by nebulization every 2 (two) hours as needed., Disp: 360 mL, Rfl: 0 .  levocetirizine (XYZAL) 5  MG tablet, Take 1 tablet (5 mg total) by mouth daily., Disp: 90 tablet, Rfl: 3 .  losartan (COZAAR) 50 MG tablet, Take 1 tablet (50 mg total) by mouth daily., Disp: 30 tablet, Rfl: 2 .  metoprolol succinate (TOPROL-XL) 100 MG 24 hr tablet, Take 100 mg by mouth daily. Take with or immediately following a meal., Disp: , Rfl:  .  oxymetazoline (AFRIN SINUS) 0.05 % nasal spray, Place 1 spray into both nostrils 2 (two) times daily. Only for 3 days., Disp: 30 mL, Rfl: 0 .  Pitavastatin Magnesium (ZYPITAMAG) 2 MG TABS, Take 1 tablet by mouth daily., Disp: 90 tablet, Rfl: 1 .  predniSONE (DELTASONE) 20 MG tablet, Take 3 tablets for 3 days, take 2 tablets for 3 days, take 1 tablet for 3 days, take 1/2 tablet for 4 days., Disp: 21 tablet, Rfl: 0 .  traZODone (DESYREL) 50 MG tablet, Take 1 tablet (50 mg total) by mouth at bedtime., Disp: 30 tablet, Rfl: 5 .  triamcinolone cream (KENALOG) 0.1 %, Apply 1 application topically 2 (two) times daily., Disp: 60 g, Rfl: 0 .  umeclidinium bromide (INCRUSE ELLIPTA) 62.5 MCG/INH AEPB, Inhale 1 puff into the lungs daily., Disp: 30 each, Rfl: [place], DO [facility] 08/17/2019 9:14 AM

## 2019-08-17 NOTE — Telephone Encounter (Signed)
Pt's wife is returning my phone call.  BI ordered CT of chest to be done in 6 months but this CT is of soft tissue of neck.  Gave her this appt info.  Nothing further needed.

## 2019-08-17 NOTE — Patient Instructions (Signed)
Thank you for visiting Dr. Valeta Harms at [facility]. Today we recommend the following:  Orders Placed This Encounter  Procedures  . CT SOFT TISSUE NECK WO CONTRAST  . CT CHEST WO CONTRAST  . Ambulatory referral to ENT  . Pulmonary Function Test   Return in about 6 weeks (around 09/28/2019) for with APP or Dr. Valeta Harms.    Please do your part to reduce the spread of COVID-19.

## 2019-08-21 ENCOUNTER — Telehealth: Payer: Self-pay | Admitting: Neurology

## 2019-08-21 ENCOUNTER — Ambulatory Visit (HOSPITAL_BASED_OUTPATIENT_CLINIC_OR_DEPARTMENT_OTHER): Payer: Medicare HMO | Attending: Physician Assistant | Admitting: Internal Medicine

## 2019-08-21 ENCOUNTER — Other Ambulatory Visit: Payer: Self-pay

## 2019-08-21 NOTE — Telephone Encounter (Signed)
Received vm from [facility] [phone]) stating patient needs in person sleep test (split night sleep study) due to COPD diagnosis. Please advise if you would like to change or maybe send to [facility]?

## 2019-08-21 NOTE — Telephone Encounter (Signed)
I guess call and see if [facility] will let him test. He does not want to go the lab.

## 2019-08-22 ENCOUNTER — Other Ambulatory Visit: Payer: Medicare HMO

## 2019-08-22 ENCOUNTER — Inpatient Hospital Stay: Admission: RE | Admit: 2019-08-22 | Payer: Medicare HMO | Source: Ambulatory Visit

## 2019-08-22 NOTE — Telephone Encounter (Signed)
Order faxed to [facility] diagnostic [phone] with confirmation received.  They will contact patient and let us know if they are able to do sleep study at home.

## 2019-08-29 ENCOUNTER — Other Ambulatory Visit: Payer: Self-pay | Admitting: Physician Assistant

## 2019-08-29 DIAGNOSIS — R062 Wheezing: Secondary | ICD-10-CM

## 2019-08-29 DIAGNOSIS — J189 Pneumonia, unspecified organism: Secondary | ICD-10-CM

## 2019-08-29 DIAGNOSIS — R0689 Other abnormalities of breathing: Secondary | ICD-10-CM

## 2019-08-29 DIAGNOSIS — M62838 Other muscle spasm: Secondary | ICD-10-CM

## 2019-08-29 DIAGNOSIS — R69 Illness, unspecified: Secondary | ICD-10-CM | POA: Diagnosis not present

## 2019-08-29 DIAGNOSIS — J449 Chronic obstructive pulmonary disease, unspecified: Secondary | ICD-10-CM

## 2019-08-29 NOTE — Telephone Encounter (Signed)
Not on medication list. Last written 06/13/2019 #30 with no refills. Please advise.

## 2019-09-01 ENCOUNTER — Ambulatory Visit (INDEPENDENT_AMBULATORY_CARE_PROVIDER_SITE_OTHER): Payer: Medicare HMO

## 2019-09-01 ENCOUNTER — Ambulatory Visit (INDEPENDENT_AMBULATORY_CARE_PROVIDER_SITE_OTHER): Payer: Medicare HMO | Admitting: Physician Assistant

## 2019-09-01 ENCOUNTER — Other Ambulatory Visit: Payer: Self-pay

## 2019-09-01 ENCOUNTER — Encounter: Payer: Self-pay | Admitting: Physician Assistant

## 2019-09-01 VITALS — BP 143/67 | HR 84 | Temp 97.9°F | Wt 187.0 lb

## 2019-09-01 DIAGNOSIS — R499 Unspecified voice and resonance disorder: Secondary | ICD-10-CM

## 2019-09-01 DIAGNOSIS — R0602 Shortness of breath: Secondary | ICD-10-CM | POA: Diagnosis not present

## 2019-09-01 DIAGNOSIS — D490 Neoplasm of unspecified behavior of digestive system: Secondary | ICD-10-CM

## 2019-09-01 DIAGNOSIS — R061 Stridor: Secondary | ICD-10-CM

## 2019-09-01 DIAGNOSIS — T17308A Unspecified foreign body in larynx causing other injury, initial encounter: Secondary | ICD-10-CM | POA: Diagnosis not present

## 2019-09-01 DIAGNOSIS — J3489 Other specified disorders of nose and nasal sinuses: Secondary | ICD-10-CM | POA: Diagnosis not present

## 2019-09-01 DIAGNOSIS — R9389 Abnormal findings on diagnostic imaging of other specified body structures: Secondary | ICD-10-CM

## 2019-09-01 MED ORDER — PREDNISONE 20 MG PO TABS: ORAL_TABLET | ORAL | 0 refills | Status: DC

## 2019-09-01 NOTE — Progress Notes (Signed)
Subjective:    Patient ID: Travis Barajas, male    DOB: April 04, 1951, 69 y.o.   MRN: [id number]  HPI  Pt is a 69 yo male with recent hx of stroke, HTN, COPD who presents to the clinic with wheezing and stridor that is worsening.   Pt has noticed these signs and symptoms since CVA that are progressively getting worse. Pulmonology order CT of neck but has not been approved yet. He is now having worsening SOB, trouble swallowing, choking on food and drink, voice change, wheezing, and stridor.   He is coughing more productive over last few days. Albuterol helps but only temporary. Prednisone helps the most. No fever, chills, body aches.   .. Active Ambulatory Problems    Diagnosis Date Noted  . GERD (gastroesophageal reflux disease) 07/23/2011  . Allergic rhinitis 07/23/2011  . Joint pain 07/23/2011  . HTN (hypertension), malignant 10/01/2011  . Tobacco user 12/05/2011  . SOB (shortness of breath) 10/18/2012  . Hematuria 11/07/2013  . Hyperlipidemia 11/17/2013  . COPD, moderate ([place]) 03/21/2014  . Erectile dysfunction 03/21/2014  . Claudication ([place]) 09/26/2014  . Generalized anxiety disorder 01/15/2015  . BPH without urinary obstruction 05/25/2016  . Acute nonseasonal allergic rhinitis due to pollen 05/04/2017  . Dyslipidemia 05/04/2017  . Current smoker 09/01/2017  . Hemoptysis 09/01/2017  . Left carotid bruit 09/01/2017  . Systolic murmur [id number]  . Trouble in sleeping 10/05/2018  . PVD (peripheral vascular disease) with claudication ([place]) 02/22/2019  . Thrombocytopenia ([place]) 02/24/2019  . Slurred speech 02/24/2019  . Dizziness 02/24/2019  . Dysphagia 02/24/2019  . TIA (transient ischemic attack) 02/24/2019  . Intercostal retractions 03/31/2019  . Cerebrovascular accident ([place]) 04/03/2019  . Distended abdomen 04/14/2019  . Uncontrolled hypertension 04/14/2019  . 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 Renal cyst, left 04/26/2019  . Gallstone 04/26/2019  .  Lower extremity edema 06/15/2019  . Anxiety 06/15/2019  . Wheezing 07/24/2019  . COPD with acute exacerbation ([place]) 07/24/2019  . Left thigh pain 07/24/2019  . Acute left-sided low back pain with left-sided sciatica 07/24/2019  . Pulmonary nodules 07/31/2019  . Change in voice 09/01/2019  . Expiratory stridor 09/01/2019  . Choking 09/01/2019   Resolved Ambulatory Problems    Diagnosis Date Noted  . BPH (benign prostatic hyperplasia) 11/21/2013  . Pneumonia of left lower lobe due to infectious organism 04/03/2019   Past Medical History:  Diagnosis Date  . Allergy   . Arthritis   . COPD (chronic obstructive pulmonary disease) ([place])   . Hypertension      Review of Systems   see HPI.  Objective:   Physical Exam Vitals reviewed.  Constitutional:      General: He is not in acute distress.    Appearance: Normal appearance.  HENT:     Head: Normocephalic.  Cardiovascular:     Rate and Rhythm: Normal rate and regular rhythm.     Pulses: Normal pulses.  Pulmonary:     Comments: No respiratory distress but significant heard wheezing with auscultation.  On auscultation wheezing heard in upper lungs and neck, bilaterally.   Lower lungs sound good today. No significant rhonchi or wheezing.  Musculoskeletal:     Right lower leg: No edema.     Left lower leg: No edema.  Neurological:     General: No focal deficit present.     Mental Status: He is alert.  Psychiatric:        Mood and Affect: Mood normal.  Assessment & Plan:  Marland KitchenMarland KitchenHiroki Barajas was seen today for cough.  Diagnoses and all orders for this visit:  Expiratory stridor -     CT SOFT TISSUE NECK WO CONTRAST  Choking, initial encounter -     CT SOFT TISSUE NECK WO CONTRAST  SOB (shortness of breath) -     CT SOFT TISSUE NECK WO CONTRAST  Change in voice -     CT SOFT TISSUE NECK WO CONTRAST  Shortness of breath -     CT SOFT TISSUE NECK WO CONTRAST   Vitals reviewed and pulse ox great.    Concern for mass/obstruction in neck causing stridor. He is now having worsening SOB, wheezing at all times, recently choking on food and drink, problems swallowing.  STAT CT ordered.  Will make plan once resulted.   CT scan showed  Right internal artery pressing on pharynx Piriform prominence.   Discussed plan with Dr. Beatrice Lecher and agreed.  Prednisone taper sent for swelling and SOB.  Referral to vascular and ENT.   Spent 45 minutes with patient and patient care.

## 2019-09-01 NOTE — Progress Notes (Signed)
Reviewed with patient.  Referral to ENT.  Referral to Vascular

## 2019-09-04 ENCOUNTER — Encounter: Payer: Self-pay | Admitting: Pulmonary Disease

## 2019-09-04 ENCOUNTER — Telehealth: Payer: Self-pay

## 2019-09-04 ENCOUNTER — Ambulatory Visit (INDEPENDENT_AMBULATORY_CARE_PROVIDER_SITE_OTHER): Payer: Medicare HMO | Admitting: Pulmonary Disease

## 2019-09-04 DIAGNOSIS — R061 Stridor: Secondary | ICD-10-CM

## 2019-09-04 NOTE — Progress Notes (Signed)
09/04/2019  Peer-to-peer: Dr. Viann Fish Case number: [id number]  69 year old male former smoker last seen in our office on 08/17/2019.  At that office visit he was seen by Dr. Valeta Harms who ordered a CT chest without contrast, CT soft tissue neck without contrast as well as a referral to ENT and pulmonary function testing.  Assessment and plan from that office visit are listed below:  Discussion:  69 year old gentleman ongoing expiratory stridor, shortness of breath dyspnea on exertion wheezing longstanding history of tobacco abuse.  Possible diagnosis of COPD.  No prior PFTs.  Vital signs stable in the office this has been going on for several months.  Plan Following Extensive Data Review & Interpretation:  . I reviewed prior external note(s) from January 2021 emergency room visit, [facility], primary care office visit February 2021 [place], [place]. . I reviewed the result(s) of 06/22/2019 Covid antigen negative, serum creatinine 0.88, CBC otherwise unremarkable, white blood cell count 6.9 platelets 130, Covid PCR negative. . I have ordered full pulmonary function test, urgent referral to ENT.  We will try to get this scheduled as soon as possible for evaluation.  Also ordered a CT soft tissue neck for further evaluation.  CT Neck ordered by PCP. Results are below:   IMPRESSION: 1. No evidence of soft tissue neck mass on this noncontrast examination. No cervical lymphadenopathy. 2. Asymmetric prominence of the left piriform sinus. Consider ENT consultation to exclude left vocal cord palsy. 3. Partially retropharyngeal course of the right internal carotid artery, which exerts slight mass effect on the posterolateral pharynx. 4. The trachea is unremarkable. 5. Mild paranasal sinus mucosal thickening.  CT Chest performed in Feb/2021:   IMPRESSION: 1. No CT findings to explain the patient's history of wheezing. 2. Bilateral pulmonary nodules measuring up to 6 mm in the right middle  lobe. Non-contrast chest CT at 3-6 months is recommended. If the nodules are stable at time of repeat CT, then future CT at 18-24 months (from today's scan) is considered optional for low-risk patients, but is recommended for high-risk patients. This recommendation follows the consensus statement: Guidelines for Management of Incidental Pulmonary Nodules Detected on CT Images: From the Fleischner Society 2017; Radiology 2017; 284:228-243. 3. Subsegmental atelectasis posterior right lower lobe.  Nothing further needed.    PCCs - please ensure pt is set up with ENT.   Will route to Dr. Valeta Harms as Juluis Rainier.   Wyn Quaker FNP

## 2019-09-04 NOTE — Progress Notes (Signed)
Thanks [place], DO [facility] 09/04/2019 3:51 PM

## 2019-09-04 NOTE — Telephone Encounter (Signed)
Peer to peer scheduled for tomorrow, 09/04/19, at 1:00. I have blocked your schedule.  Case # [id number]  Dr Armanda Heritage with [facility] will be calling your cell to do this. (they have my cell as an alternate in case they cant get you).

## 2019-09-05 ENCOUNTER — Ambulatory Visit (INDEPENDENT_AMBULATORY_CARE_PROVIDER_SITE_OTHER): Payer: Medicare HMO | Admitting: Vascular Surgery

## 2019-09-05 ENCOUNTER — Other Ambulatory Visit: Payer: Self-pay

## 2019-09-05 ENCOUNTER — Ambulatory Visit (HOSPITAL_COMMUNITY)
Admission: RE | Admit: 2019-09-05 | Discharge: 2019-09-05 | Disposition: A | Discharge status: Home / Self Care | Payer: Medicare HMO | Source: Ambulatory Visit | Attending: Vascular Surgery | Admitting: Vascular Surgery

## 2019-09-05 ENCOUNTER — Encounter: Payer: Self-pay | Admitting: Vascular Surgery

## 2019-09-05 VITALS — BP 148/75 | HR 82 | Temp 98.0°F | Resp 20 | Ht 63.0 in | Wt 184.0 lb

## 2019-09-05 DIAGNOSIS — R061 Stridor: Secondary | ICD-10-CM | POA: Diagnosis not present

## 2019-09-05 DIAGNOSIS — R0989 Other specified symptoms and signs involving the circulatory and respiratory systems: Secondary | ICD-10-CM

## 2019-09-05 NOTE — Telephone Encounter (Signed)
Done. She will fax authorization code.

## 2019-09-05 NOTE — Progress Notes (Signed)
Vascular and Vein Specialist of [place]  Patient name: Travis Barajas MRN: [id number] DOB: 18-Sep-1950 Sex: male  REASON FOR CONSULT: Evaluation of abnormal CT scan of the neck  HPI: Travis Barajas is a 69 y.o. male, who is here today for evaluation discussion of abnormal CT scan.  He is here today with his wife.  He reports that he has difficulty remembering issues regarding medical problems.  Does have a prior history of stroke.  His recent evaluation was due to some expiratory stridor.  He reports that this was minimal and a "tickling in my throat.  Part of his work-up included a CT scan.  Radiologist interpretation was partially retropharyngeal course of his right internal carotid with slight mass-effect.  He is here today for further discussion of this.  Past Medical History:  Diagnosis Date  . Allergy   . Anxiety   . Arthritis   . COPD (chronic obstructive pulmonary disease) ([place])   . Hyperlipidemia   . Hypertension   . Stroke [facility])     Family History  Problem Relation Age of Onset  . Diabetes Mother   . Hypertension Mother   . Alzheimer's disease Mother   . CAD Mother   . Heart disease Mother   . Hyperlipidemia Mother   . Diabetes Father   . Hypertension Father   . Cancer Brother   . Peripheral vascular disease Brother   . Cancer Daughter     SOCIAL HISTORY: Social History   Socioeconomic History  . Marital status: Married    Spouse name: Hattie  . Number of children: 7  . Years of education: 57  . Highest education level: Some college, no degree  Occupational History    Comment: Retired  Tobacco Use  . Smoking status: Former Smoker    Packs/day: 0.50    Years: 40.00    Pack years: 20.00    Types: Cigarettes    Quit date: 02/15/2019    Years since quitting: 0.5  . Smokeless tobacco: Never Used  Substance and Sexual Activity  . Alcohol use: Not Currently    Alcohol/week: 0.0 standard drinks  . Drug use: No    . Sexual activity: Yes  Other Topics Concern  . Not on file  Social History Narrative  . Not on file   Social Determinants of Health   Financial Resource Strain:   . Difficulty of Paying Living Expenses:   Food Insecurity:   . Worried About Charity fundraiser in the Last Year:   . Arboriculturist in the Last Year:   Transportation Needs:   . Film/video editor (Medical):   Marland Kitchen Lack of Transportation (Non-Medical):   Physical Activity:   . Days of Exercise per Week:   . Minutes of Exercise per Session:   Stress:   . Feeling of Stress :   Social Connections:   . Frequency of Communication with Friends and Family:   . Frequency of Social Gatherings with Friends and Family:   . Attends Religious Services:   . Active Member of Clubs or Organizations:   . Attends Archivist Meetings:   Marland Kitchen Marital Status:   Intimate Partner Violence:   . Fear of Current or Ex-Partner:   . Emotionally Abused:   Marland Kitchen Physically Abused:   . Sexually Abused:     Allergies  Allergen Reactions  . Cialis [Tadalafil]     Hallucinations   . Lipitor [Atorvastatin]     itching  .  Niacin And Related Itching  . Pravachol [Pravastatin] Itching    Itching.     Current Outpatient Medications  Medication Sig Dispense Refill  . albuterol (VENTOLIN HFA) 108 (90 Base) MCG/ACT inhaler Inhale 2 puffs into the lungs every 6 (six) hours as needed. 16 g 1  . ALPRAZolam (XANAX) 0.5 MG tablet Take 1 tablet (0.5 mg total) by mouth 2 (two) times daily as needed. for anxiety 30 tablet 0  . AMBULATORY NON FORMULARY MEDICATION Nebulizer machine and supplies for wheezing/retractions/asthma exacerbations. 1 Device 0  . amLODipine (NORVASC) 10 MG tablet Take 1 tablet (10 mg total) by mouth daily. 90 tablet 1  . budesonide-formoterol (SYMBICORT) 160-4.5 MCG/ACT inhaler Inhale 2 puffs into the lungs 2 (two) times daily. 1 Inhaler 5  . clopidogrel (PLAVIX) 75 MG tablet Take 75 mg by mouth daily.    Marland Kitchen doxazosin  (CARDURA) 4 MG tablet Take 1 tablet (4 mg total) by mouth 2 (two) times daily. 180 tablet 1  . furosemide (LASIX) 20 MG tablet Take 1 tablet (20 mg total) by mouth daily. 30 tablet 5  . hydrALAZINE (APRESOLINE) 25 MG tablet Take 1 tablet (25 mg total) by mouth 3 (three) times daily. 90 tablet 5  . ibuprofen (ADVIL) 600 MG tablet Take 600 mg by mouth every 6 (six) hours as needed.    Marland Kitchen ipratropium-albuterol (DUONEB) 0.5-2.5 (3) MG/3ML SOLN Take 3 mLs by nebulization every 2 (two) hours as needed. 360 mL 0  . levocetirizine (XYZAL) 5 MG tablet Take 1 tablet (5 mg total) by mouth daily. 90 tablet 3  . losartan (COZAAR) 50 MG tablet Take 1 tablet (50 mg total) by mouth daily. 30 tablet 2  . metoprolol succinate (TOPROL-XL) 100 MG 24 hr tablet Take 100 mg by mouth daily. Take with or immediately following a meal.    . montelukast (SINGULAIR) 10 MG tablet Take 1 tablet by mouth at bedtime.    Marland Kitchen oxymetazoline (AFRIN SINUS) 0.05 % nasal spray Place 1 spray into both nostrils 2 (two) times daily. Only for 3 days. 30 mL 0  . Pitavastatin Magnesium (ZYPITAMAG) 2 MG TABS Take 1 tablet by mouth daily. 90 tablet 1  . predniSONE (DELTASONE) 20 MG tablet Take twice a day for 5 days then once a day for 4 days then 1/2 tablet for 4 days. 16 tablet 0  . traZODone (DESYREL) 50 MG tablet Take 1 tablet (50 mg total) by mouth at bedtime. 30 tablet 5  . umeclidinium bromide (INCRUSE ELLIPTA) 62.5 MCG/INH AEPB Inhale 1 puff into the lungs daily. 30 each 5   No current facility-administered medications for this visit.    REVIEW OF SYSTEMS:  [X]  denotes positive finding, [ ]  denotes negative finding Cardiac  Comments:  Chest pain or chest pressure:    Shortness of breath upon exertion: x   Short of breath when lying flat:    Irregular heart rhythm:        Vascular    Pain in calf, thigh, or hip brought on by ambulation: x   Pain in feet at night that wakes you up from your sleep:  x   Blood clot in your veins:      Leg swelling:         Pulmonary    Oxygen at home:    Productive cough:  x   Wheezing:  x       Neurologic    Sudden weakness in arms or legs:     Sudden  numbness in arms or legs:     Sudden onset of difficulty speaking or slurred speech: x   Temporary loss of vision in one eye:     Problems with dizziness:         Gastrointestinal    Blood in stool:     Vomited blood:         Genitourinary    Burning when urinating:     Blood in urine:        Psychiatric    Major depression:         Hematologic    Bleeding problems:    Problems with blood clotting too easily:        Skin    Rashes or ulcers:        Constitutional    Fever or chills:      PHYSICAL EXAM: Vitals:   09/05/19 1535  BP: (!) 148/75  Pulse: 82  Resp: 20  Temp: 98 F (36.7 C)  SpO2: 98%  Weight: 184 lb (83.5 kg)  Height: 5\' 3"  (1.6 m)    GENERAL: The patient is a well-nourished male, in no acute distress. The vital signs are documented above. CARDIOVASCULAR: Carotid arteries without bruits bilaterally.  2+ radial pulses bilaterally. PULMONARY: There is good air exchange  ABDOMEN: Soft and non-tender  MUSCULOSKELETAL: There are no major deformities or cyanosis. NEUROLOGIC: No focal weakness or paresthesias are detected. SKIN: There are no ulcers or rashes noted. PSYCHIATRIC: The patient has a normal affect.  DATA:  Carotid duplex in our office revealed no evidence of carotid stenoses bilaterally  CT scan was reviewed and discussed with the patient and his wife.  This shows minimal deflection of the internal carotid toward the midline with insignificant placement of his oropharynx.  I did discuss the CT scan with the radiologist.  They agreed that this was a minimal issue.  My concern was of airway since this is not an area of expertise and they felt that this was a normal variation as well.  MEDICAL ISSUES: No evidence of carotid disease.  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  He does have appointment with ear nose and throat and I have encouraged him to follow-up with this.  He will see [place] again on an as-needed basis   Rosetta Posner, MD [facility] Vascular and Vein Specialists of [facility] Tel [phone] Pager [phone]

## 2019-09-07 ENCOUNTER — Telehealth: Payer: Self-pay | Admitting: Pulmonary Disease

## 2019-09-07 DIAGNOSIS — R0989 Other specified symptoms and signs involving the circulatory and respiratory systems: Secondary | ICD-10-CM | POA: Diagnosis not present

## 2019-09-07 DIAGNOSIS — Z8673 Personal history of transient ischemic attack (TIA), and cerebral infarction without residual deficits: Secondary | ICD-10-CM | POA: Diagnosis not present

## 2019-09-07 DIAGNOSIS — J343 Hypertrophy of nasal turbinates: Secondary | ICD-10-CM | POA: Diagnosis not present

## 2019-09-07 DIAGNOSIS — J449 Chronic obstructive pulmonary disease, unspecified: Secondary | ICD-10-CM | POA: Diagnosis not present

## 2019-09-07 NOTE — Telephone Encounter (Signed)
Thank you   Travis Barajas  

## 2019-09-07 NOTE — Telephone Encounter (Signed)
I called and spoke with patient's wife and she confirmed that the patient saw Dr. Redmond Baseman today. Wanted to make you aware.

## 2019-09-07 NOTE — Telephone Encounter (Signed)
Pt's wife returning call.  [phone]

## 2019-09-07 NOTE — Telephone Encounter (Signed)
Lauraine Rinne, NP  Garner Nash, DO; [place], Marcelline Deist, Zettie Pho, RN  P2P not needed as PCP ordered the CT and got approval   Routed note to BI    Pt does still need to ENT referral   Daneil Dan, can you or the [facility] ensure he is scheduled?   Aaron Edelman  --------------------------  Per Judeen Hammans, [facility]: General 09/04/2019 11:46 AM Leonie Green, Rosalene Billings - -  Note   Spoke to Truman Hayward and she states pt had appt on 3/10 and he canceled.  I made him an appt on 3/25 at 8:40.  Called pt's wife and gave her appt info.           --------------------------   [facility] for pt to be sure he kept his appt today.

## 2019-09-17 ENCOUNTER — Other Ambulatory Visit: Payer: Self-pay | Admitting: Physician Assistant

## 2019-09-17 DIAGNOSIS — M62838 Other muscle spasm: Secondary | ICD-10-CM

## 2019-09-17 DIAGNOSIS — F419 Anxiety disorder, unspecified: Secondary | ICD-10-CM

## 2019-10-27 ENCOUNTER — Other Ambulatory Visit (HOSPITAL_COMMUNITY): Payer: Medicare HMO

## 2019-10-29 ENCOUNTER — Other Ambulatory Visit: Payer: Self-pay | Admitting: Physician Assistant

## 2019-10-29 DIAGNOSIS — I10 Essential (primary) hypertension: Secondary | ICD-10-CM

## 2019-10-29 NOTE — Progress Notes (Signed)
Subjective:    CC: Follow-up on breathing issues  HPI: Pleasant 69 year old male presenting today for follow-up on breathing issues.  Has recently been evaluated for expiratory stridor and wheezing by PCP, pulmonology, vascular surgery, cardiology, and ENT.  Despite imaging and in-depth examination by several specialties, no reason for the expiratory stridor and wheezing has been identified.   Continues to have shortness of breath with activity. No snoring at night. When using inhalers appropriately, notes that lungs remain clear and he has no rattling at night while sleeping. Did not complete sleep study as he was told with his health issues, he needs to do that at the sleep center. Expresses that he does not want to do that at this time. Had PFTs ordered with Pulmonology but did not complete them.   Reports that he is planning to get started back doing [place]. Trying to stay active but requires frequent breaks. Requesting a refill of prednisone as he breathes better and does not have back pain while taking it.  I reviewed the past medical history, family history, social history, surgical history, and allergies today and no changes were needed.  Please see the problem list section below in epic for further details.  Past Medical History: Past Medical History:  Diagnosis Date  . Allergy   . Anxiety   . Arthritis   . COPD (chronic obstructive pulmonary disease) ([place])   . Hyperlipidemia   . Hypertension   . Stroke [facility])    Past Surgical History: Past Surgical History:  Procedure Laterality Date  . FOOT SURGERY     Social History: Social History   Socioeconomic History  . Marital status: Married    Spouse name: Hattie  . Number of children: 7  . Years of education: 32  . Highest education level: Some college, no degree  Occupational History    Comment: Retired  Tobacco Use  . Smoking status: Former Smoker    Packs/day: 0.50    Years: 40.00    Pack years: 20.00    Types:  Cigarettes    Quit date: 02/15/2019    Years since quitting: 0.7  . Smokeless tobacco: Never Used  Substance and Sexual Activity  . Alcohol use: Not Currently    Alcohol/week: 0.0 standard drinks  . Drug use: No  . Sexual activity: Yes  Other Topics Concern  . Not on file  Social History Narrative  . Not on file   Social Determinants of Health   Financial Resource Strain:   . Difficulty of Paying Living Expenses:   Food Insecurity:   . Worried About Charity fundraiser in the Last Year:   . Arboriculturist in the Last Year:   Transportation Needs:   . Film/video editor (Medical):   Marland Kitchen Lack of Transportation (Non-Medical):   Physical Activity:   . Days of Exercise per Week:   . Minutes of Exercise per Session:   Stress:   . Feeling of Stress :   Social Connections:   . Frequency of Communication with Friends and Family:   . Frequency of Social Gatherings with Friends and Family:   . Attends Religious Services:   . Active Member of Clubs or Organizations:   . Attends Archivist Meetings:   Marland Kitchen Marital Status:    Family History: Family History  Problem Relation Age of Onset  . Diabetes Mother   . Hypertension Mother   . Alzheimer's disease Mother   . CAD Mother   .  Heart disease Mother   . Hyperlipidemia Mother   . Diabetes Father   . Hypertension Father   . Cancer Brother   . Peripheral vascular disease Brother   . Cancer Daughter    Allergies: Allergies  Allergen Reactions  . Cialis [Tadalafil]     Hallucinations   . Lipitor [Atorvastatin]     itching  . Niacin And Related Itching  . Pravachol [Pravastatin] Itching    Itching.    Medications: See med rec.  Review of Systems: See HPI for pertinent negatives and positives.   Objective:    General: Well Developed, well nourished, and in no acute distress.  Neuro: Alert and oriented x3.  HEENT: Normocephalic, atraumatic.  Skin: Warm and dry. Cardiac: Regular rate and rhythm, no murmurs  rubs or gallops, no lower extremity edema.  Respiratory: Clear to auscultation bilaterally. Not using accessory muscles, speaking in full sentences. MSK: No tenderness to palpation over lumbar spinal processes. Mild tenderness to left SI joint. BLE muscle strength 5/5. Sensation intact to BLE bilaterally.   Impression and Recommendations:    1. Expiratory stridor/wheezing/SOB Continue inhalers as prescribed. Strongly encouraged reconnecting with pulmonology to get the PFTs completed. Discussed prednisone and the risks and benefits associated with long-term/frequent use. Will try to avoid further steroids at this time.  3. Acute left-sided low back pain without sciatica Recent x-ray showed mild degeneration of the lumbar spine. Suspect pain related to arthritis in combination with SI joint dysfunction. Recommend conservative treatment at this time with ice, heat, Tylenol, stretching, and massage. On Plavix, higher risk for GI bleed. Refraining from starting an NSAID at this time. See above for discussion regarding prednisone.  Return if symptoms worsen or fail to improve. ___________________________________________ Clearnce Sorrel, DNP, APRN, FNP-BC [facility] [place]

## 2019-10-30 ENCOUNTER — Encounter: Payer: Self-pay | Admitting: Medical-Surgical

## 2019-10-30 ENCOUNTER — Other Ambulatory Visit: Payer: Self-pay

## 2019-10-30 ENCOUNTER — Ambulatory Visit (INDEPENDENT_AMBULATORY_CARE_PROVIDER_SITE_OTHER): Payer: Medicare HMO | Admitting: Medical-Surgical

## 2019-10-30 VITALS — BP 158/74 | HR 108 | Temp 98.2°F | Ht 63.0 in | Wt 184.6 lb

## 2019-10-30 DIAGNOSIS — R062 Wheezing: Secondary | ICD-10-CM

## 2019-10-30 DIAGNOSIS — M545 Low back pain, unspecified: Secondary | ICD-10-CM

## 2019-10-30 DIAGNOSIS — R061 Stridor: Secondary | ICD-10-CM | POA: Diagnosis not present

## 2019-10-30 NOTE — Patient Instructions (Signed)
Stretching [place] [facility] for pulmonary function tests Tylenol for discomfort Heat/ice

## 2019-11-02 ENCOUNTER — Other Ambulatory Visit: Payer: Self-pay | Admitting: Physician Assistant

## 2019-11-02 DIAGNOSIS — R062 Wheezing: Secondary | ICD-10-CM

## 2019-11-02 DIAGNOSIS — J189 Pneumonia, unspecified organism: Secondary | ICD-10-CM

## 2019-11-02 DIAGNOSIS — R69 Illness, unspecified: Secondary | ICD-10-CM | POA: Diagnosis not present

## 2019-11-02 DIAGNOSIS — R0689 Other abnormalities of breathing: Secondary | ICD-10-CM

## 2019-11-02 DIAGNOSIS — J449 Chronic obstructive pulmonary disease, unspecified: Secondary | ICD-10-CM

## 2019-11-16 DIAGNOSIS — I639 Cerebral infarction, unspecified: Secondary | ICD-10-CM | POA: Diagnosis not present

## 2019-11-20 ENCOUNTER — Telehealth: Payer: Self-pay

## 2019-11-20 NOTE — Telephone Encounter (Signed)
Pt's spouse, Georga Kaufmann, states that the pt has this medication and does not need any refills at this time.  She is unsure why we received this fax.  Spouse also stated that he is seeing neurology for his neck pain and thinks that the pt may need a refill of cyclobenzaprine.  Advised spouse that pt should probably request refills from neuro when needed.  Spouse expressed understanding.  Charyl Bigger, CMA

## 2019-11-20 NOTE — Telephone Encounter (Signed)
[facility] has livalo with magnesium for pretty cheap. Are you willing to get it there?

## 2019-11-20 NOTE — Telephone Encounter (Signed)
Received fax from pharmacy requesting change in therapy for Livalo.  Pt cannot afford this medication.  Charyl Bigger, CMA

## 2019-12-04 ENCOUNTER — Other Ambulatory Visit: Payer: Self-pay | Admitting: Neurology

## 2019-12-04 MED ORDER — MONTELUKAST SODIUM 10 MG PO TABS: 10.0000 mg | ORAL_TABLET | Freq: Every day | ORAL | 1 refills | Status: DC

## 2019-12-11 ENCOUNTER — Other Ambulatory Visit: Payer: Self-pay | Admitting: Physician Assistant

## 2019-12-11 DIAGNOSIS — I10 Essential (primary) hypertension: Secondary | ICD-10-CM

## 2019-12-11 DIAGNOSIS — I6339 Cerebral infarction due to thrombosis of other cerebral artery: Secondary | ICD-10-CM

## 2019-12-22 ENCOUNTER — Telehealth: Payer: Self-pay | Admitting: Neurology

## 2019-12-22 DIAGNOSIS — R911 Solitary pulmonary nodule: Secondary | ICD-10-CM

## 2019-12-22 NOTE — Telephone Encounter (Signed)
Patient's wife called to see when patient should have repeat CT chest. Last said to repeat in 3-6 months. Ordered repeat scan. Patient's wife made aware. [place].   Last done 07/28/2019: IMPRESSION: 1. No CT findings to explain the patient's history of wheezing. 2. Bilateral pulmonary nodules measuring up to 6 mm in the right middle lobe. Non-contrast chest CT at 3-6 months is recommended. If the nodules are stable at time of repeat CT, then future CT at 18-24 months (from today's scan) is considered optional for low-risk patients, but is recommended for high-risk patients. This recommendation follows the consensus statement: Guidelines for Management of Incidental Pulmonary Nodules Detected on CT Images: From the Fleischner Society 2017; Radiology 2017; 284:228-243. 3. Subsegmental atelectasis posterior right lower lobe.

## 2019-12-25 ENCOUNTER — Other Ambulatory Visit: Payer: Self-pay

## 2019-12-25 ENCOUNTER — Ambulatory Visit (INDEPENDENT_AMBULATORY_CARE_PROVIDER_SITE_OTHER): Payer: Medicare HMO

## 2019-12-25 DIAGNOSIS — R911 Solitary pulmonary nodule: Secondary | ICD-10-CM | POA: Diagnosis not present

## 2019-12-25 DIAGNOSIS — R918 Other nonspecific abnormal finding of lung field: Secondary | ICD-10-CM | POA: Diagnosis not present

## 2019-12-25 NOTE — Progress Notes (Signed)
GREAT news. The pulmonary nodules appear stable. Follow up in 2 years.

## 2020-01-02 ENCOUNTER — Ambulatory Visit (INDEPENDENT_AMBULATORY_CARE_PROVIDER_SITE_OTHER): Payer: Medicare HMO | Admitting: Physician Assistant

## 2020-01-02 ENCOUNTER — Encounter: Payer: Self-pay | Admitting: Physician Assistant

## 2020-01-02 VITALS — BP 117/66 | HR 82 | Ht 63.0 in | Wt 180.0 lb

## 2020-01-02 DIAGNOSIS — M7989 Other specified soft tissue disorders: Secondary | ICD-10-CM

## 2020-01-02 DIAGNOSIS — M722 Plantar fascial fibromatosis: Secondary | ICD-10-CM | POA: Diagnosis not present

## 2020-01-02 DIAGNOSIS — N521 Erectile dysfunction due to diseases classified elsewhere: Secondary | ICD-10-CM | POA: Diagnosis not present

## 2020-01-02 MED ORDER — DICLOFENAC SODIUM 1 % EX GEL: 4.0000 g | Freq: Four times a day (QID) | CUTANEOUS | 11 refills | Status: DC

## 2020-01-02 NOTE — Patient Instructions (Signed)
Plantar Fasciitis  Plantar fasciitis is a painful foot condition that affects the heel. It occurs when the band of tissue that connects the toes to the heel bone (plantar fascia) becomes irritated. This can happen as the result of exercising too much or doing other repetitive activities (overuse injury). The pain from plantar fasciitis can range from mild irritation to severe pain that makes it difficult to walk or move. The pain is usually worse in the morning after sleeping, or after sitting or lying down for a while. Pain may also be worse after long periods of walking or standing. What are the causes? This condition may be caused by:  Standing for long periods of time.  Wearing shoes that do not have good arch support.  Doing activities that put stress on joints (high-impact activities), including running, aerobics, and ballet.  Being overweight.  An abnormal way of walking (gait).  Tight muscles in the back of your lower leg (calf).  High arches in your feet.  Starting a new athletic activity. What are the signs or symptoms? The main symptom of this condition is heel pain. Pain may:  Be worse with first steps after a time of rest, especially in the morning after sleeping or after you have been sitting or lying down for a while.  Be worse after long periods of standing still.  Decrease after 30-45 minutes of activity, such as gentle walking. How is this diagnosed? This condition may be diagnosed based on your medical history and your symptoms. Your health care provider may ask questions about your activity level. Your health care provider will do a physical exam to check for:  A tender area on the bottom of your foot.  A high arch in your foot.  Pain when you move your foot.  Difficulty moving your foot. You may have imaging tests to confirm the diagnosis, such as:  X-rays.  Ultrasound.  MRI. How is this treated? Treatment for plantar fasciitis depends on how  severe your condition is. Treatment may include:  Rest, ice, applying pressure (compression), and raising the affected foot (elevation). This may be called RICE therapy. Your health care provider may recommend RICE therapy along with over-the-counter pain medicines to manage your pain.  Exercises to stretch your calves and your plantar fascia.  A splint that holds your foot in a stretched, upward position while you sleep (night splint).  Physical therapy to relieve symptoms and prevent problems in the future.  Injections of steroid medicine (cortisone) to relieve pain and inflammation.  Stimulating your plantar fascia with electrical impulses (extracorporeal shock wave therapy). This is usually the last treatment option before surgery.  Surgery, if other treatments have not worked after 12 months. Follow these instructions at home:  Managing pain, stiffness, and swelling  If directed, put ice on the painful area: ? Put ice in a plastic bag, or use a frozen bottle of water. ? Place a towel between your skin and the bag or bottle. ? Roll the bottom of your foot over the bag or bottle. ? Do this for 20 minutes, 2-3 times a day.  Wear athletic shoes that have air-sole or gel-sole cushions, or try wearing soft shoe inserts that are designed for plantar fasciitis.  Raise (elevate) your foot above the level of your heart while you are sitting or lying down. Activity  Avoid activities that cause pain. Ask your health care provider what activities are safe for you.  Do physical therapy exercises and stretches as told   by your health care provider.  Try activities and forms of exercise that are easier on your joints (low-impact). Examples include swimming, water aerobics, and biking. General instructions  Take over-the-counter and prescription medicines only as told by your health care provider.  Wear a night splint while sleeping, if told by your health care provider. Loosen the splint  if your toes tingle, become numb, or turn cold and blue.  Maintain a healthy weight, or work with your health care provider to lose weight as needed.  Keep all follow-up visits as told by your health care provider. This is important. Contact a health care provider if you:  Have symptoms that do not go away after caring for yourself at home.  Have pain that gets worse.  Have pain that affects your ability to move or do your daily activities. Summary  Plantar fasciitis is a painful foot condition that affects the heel. It occurs when the band of tissue that connects the toes to the heel bone (plantar fascia) becomes irritated.  The main symptom of this condition is heel pain that may be worse after exercising too much or standing still for a long time.  Treatment varies, but it usually starts with rest, ice, compression, and elevation (RICE therapy) and over-the-counter medicines to manage pain. This information is not intended to replace advice given to you by your health care provider. Make sure you discuss any questions you have with your health care provider. Document Revised: 05/14/2017 Document Reviewed: 03/29/2017 Elsevier Patient Education  2020 Elsevier Inc.  Plantar Fasciitis Rehab Ask your health care provider which exercises are safe for you. Do exercises exactly as told by your health care provider and adjust them as directed. It is normal to feel mild stretching, pulling, tightness, or discomfort as you do these exercises. Stop right away if you feel sudden pain or your pain gets worse. Do not begin these exercises until told by your health care provider. Stretching and range-of-motion exercises These exercises warm up your muscles and joints and improve the movement and flexibility of your foot. These exercises also help to relieve pain. Plantar fascia stretch  1. Sit with your left / right leg crossed over your opposite knee. 2. Hold your heel with one hand with that  thumb near your arch. With your other hand, hold your toes and gently pull them back toward the top of your foot. You should feel a stretch on the bottom of your toes or your foot (plantar fascia) or both. 3. Hold this stretch for__________ seconds. 4. Slowly release your toes and return to the starting position. Repeat __________ times. Complete this exercise __________ times a day. Gastrocnemius stretch, standing This exercise is also called a calf (gastroc) stretch. It stretches the muscles in the back of the upper calf. 1. Stand with your hands against a wall. 2. Extend your left / right leg behind you, and bend your front knee slightly. 3. Keeping your heels on the floor and your back knee straight, shift your weight toward the wall. Do not arch your back. You should feel a gentle stretch in your upper left / right calf. 4. Hold this position for __________ seconds. Repeat __________ times. Complete this exercise __________ times a day. Soleus stretch, standing This exercise is also called a calf (soleus) stretch. It stretches the muscles in the back of the lower calf. 1. Stand with your hands against a wall. 2. Extend your left / right leg behind you, and bend your front   knee slightly. 3. Keeping your heels on the floor, bend your back knee and shift your weight slightly over your back leg. You should feel a gentle stretch deep in your lower calf. 4. Hold this position for __________ seconds. Repeat __________ times. Complete this exercise __________ times a day. Gastroc and soleus stretch, standing step This exercise stretches the muscles in the back of the lower leg. These muscles are in the upper calf (gastrocnemius) and the lower calf (soleus). 1. Stand with the ball of your left / right foot on a step. The ball of your foot is on the walking surface, right under your toes. 2. Keep your other foot firmly on the same step. 3. Hold on to the wall or a railing for balance. 4. Slowly  lift your other foot, allowing your body weight to press your left / right heel down over the edge of the step. You should feel a stretch in your left / right calf. 5. Hold this position for __________ seconds. 6. Return both feet to the step. 7. Repeat this exercise with a slight bend in your left / right knee. Repeat __________ times with your left / right knee straight and __________ times with your left / right knee bent. Complete this exercise __________ times a day. Balance exercise This exercise builds your balance and strength control of your arch to help take pressure off your plantar fascia. Single leg stand If this exercise is too easy, you can try it with your eyes closed or while standing on a pillow. 1. Without shoes, stand near a railing or in a doorway. You may hold on to the railing or door frame as needed. 2. Stand on your left / right foot. Keep your big toe down on the floor and try to keep your arch lifted. Do not let your foot roll inward. 3. Hold this position for __________ seconds. Repeat __________ times. Complete this exercise __________ times a day. This information is not intended to replace advice given to you by your health care provider. Make sure you discuss any questions you have with your health care provider. Document Revised: 09/22/2018 Document Reviewed: 03/30/2018 Elsevier Patient Education  2020 Elsevier Inc.  

## 2020-01-02 NOTE — Progress Notes (Signed)
Subjective:    Patient ID: Travis Barajas, male    DOB: [birth year], 69 y.o.   MRN: [id number]  HPI  Pt is a 69 yo male with COPD, hx of CVA, HTN, CAD who presents to the clinic with bilateral heel pain and bilateral swollen hands.   Yesterday heel pain was very bad but seems to be better today. Admits to walking a lot this weekend in [place]. Pt has done nothing to make better.   He has also noticed intermittent swelling of both hands. No increase of SOB. Some tingling from time to time. Worse at night.   He has ED and cialis and viagra have not worked. He would like referral.    .. Active Ambulatory Problems    Diagnosis Date Noted  . GERD (gastroesophageal reflux disease) 07/23/2011  . Allergic rhinitis 07/23/2011  . Joint pain 07/23/2011  . HTN (hypertension), malignant 10/01/2011  . Tobacco user 12/05/2011  . SOB (shortness of breath) 10/18/2012  . Hematuria 11/07/2013  . Hyperlipidemia 11/17/2013  . COPD, moderate ([place]) 03/21/2014  . Erectile dysfunction 03/21/2014  . Claudication ([place]) 09/26/2014  . Generalized anxiety disorder 01/15/2015  . BPH without urinary obstruction 05/25/2016  . Acute nonseasonal allergic rhinitis due to pollen 05/04/2017  . Dyslipidemia 05/04/2017  . Current smoker 09/01/2017  . Hemoptysis 09/01/2017  . Left carotid bruit 09/01/2017  . Systolic murmur 47/02/6282  . Trouble in sleeping 10/05/2018  . PVD (peripheral vascular disease) with claudication ([place]) 02/22/2019  . Thrombocytopenia ([place]) 02/24/2019  . Slurred speech 02/24/2019  . Dizziness 02/24/2019  . Dysphagia 02/24/2019  . TIA (transient ischemic attack) 02/24/2019  . Intercostal retractions 03/31/2019  . Cerebrovascular accident ([place]) 04/03/2019  . Distended abdomen 04/14/2019  . Uncontrolled hypertension 04/14/2019  . 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 Renal cyst, left 04/26/2019  . Gallstone 04/26/2019  . Lower extremity edema 06/15/2019  . Anxiety  06/15/2019  . Wheezing 07/24/2019  . COPD with acute exacerbation ([place]) 07/24/2019  . Left thigh pain 07/24/2019  . Acute left-sided low back pain with left-sided sciatica 07/24/2019  . Pulmonary nodules 07/31/2019  . Change in voice 09/01/2019  . Expiratory stridor 09/01/2019  . Choking 09/01/2019  . Abnormal CT scan, neck 09/01/2019  . 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 Bilateral hand swelling 01/05/2020  . Plantar fasciitis 01/05/2020   Resolved Ambulatory Problems    Diagnosis Date Noted  . BPH (benign prostatic hyperplasia) 11/21/2013  . Pneumonia of left lower lobe due to infectious organism 04/03/2019   Past Medical History:  Diagnosis Date  . Allergy   . Arthritis   . COPD (chronic obstructive pulmonary disease) ([place])   . Hypertension   . Stroke [facility])      Review of Systems  All other systems reviewed and are negative.      Objective:   Physical Exam Vitals reviewed.  Constitutional:      Appearance: Normal appearance.  HENT:     Head: Normocephalic.  Cardiovascular:     Rate and Rhythm: Normal rate and regular rhythm.     Pulses: Normal pulses.     Heart sounds: Murmur heard.   Pulmonary:     Effort: Pulmonary effort is normal.  Musculoskeletal:     Right lower leg: No edema.     Left lower leg: No edema.     Comments: No significant hand swelling today.  Normal hand grip 5/5.  Normal ROM of shoulders and neck.   Tenderness over bilateral  heels.  NROM of feet. No swelling or pain with ROM.  Negative tinels.  phalens with some numbness reported bilaterally.   Neurological:     General: No focal deficit present.     Mental Status: He is alert and oriented to person, place, and time.  Psychiatric:        Mood and Affect: Mood normal.           Assessment & Plan:  Marland KitchenMarland KitchenAleksa Catterton was seen today for foot pain.  Diagnoses and all orders for this visit:  Plantar fasciitis -     diclofenac Sodium (VOLTAREN) 1 % GEL; Apply 4 g topically 4  (four) times daily. To affected joint.  Bilateral hand swelling -     NCV with EMG(electromyography); Future  Erectile dysfunction due to diseases classified elsewhere -     Ambulatory referral to Urology   Bilateral heel pain seems like plantar fasciitis likely caused by walking this weekend.  Discussed bottled of water frozen regularly, use diclofenac gel, wear good supportive shoes. Follow up as needed.   Concern for carpel tunnel. Will get EMG. No signs of fluid overload. Vitals good.   Sent referral for ED. Oral medications with no benefit.

## 2020-01-05 DIAGNOSIS — M7989 Other specified soft tissue disorders: Secondary | ICD-10-CM | POA: Insufficient documentation

## 2020-01-05 DIAGNOSIS — M722 Plantar fascial fibromatosis: Secondary | ICD-10-CM | POA: Insufficient documentation

## 2020-01-07 ENCOUNTER — Other Ambulatory Visit: Payer: Self-pay | Admitting: Physician Assistant

## 2020-01-07 DIAGNOSIS — I6339 Cerebral infarction due to thrombosis of other cerebral artery: Secondary | ICD-10-CM

## 2020-01-07 DIAGNOSIS — F419 Anxiety disorder, unspecified: Secondary | ICD-10-CM

## 2020-01-07 DIAGNOSIS — R600 Localized edema: Secondary | ICD-10-CM

## 2020-01-07 DIAGNOSIS — I10 Essential (primary) hypertension: Secondary | ICD-10-CM

## 2020-01-08 NOTE — Telephone Encounter (Signed)
Last filled 09/18/2019 #30 with no refills.  Last appt 01/02/2020 [facility] patient

## 2020-01-26 ENCOUNTER — Telehealth: Payer: Self-pay | Admitting: Pulmonary Disease

## 2020-01-26 NOTE — Telephone Encounter (Signed)
FYI - BI placed order on 3/4 for pt to have chest CT in 6 months.  I called & scheduled CT for Sept.  I called pt to give him appt info & spoke to pt's wife.  She states he just had chest CT.  I checked appt desk & pt had chest CT on 7/12 that was ordered by Iran Planas.  I told wife I would make BI aware.

## 2020-01-29 NOTE — Telephone Encounter (Signed)
Dr. Valeta Harms reviewed CT ordered by [facility]. Additional scan will not be necessary.

## 2020-01-29 NOTE — Telephone Encounter (Signed)
PCCM:  Thanks. Images reviewed. Nodules stable.  Garner Nash, DO [facility] 01/29/2020 2:41 PM

## 2020-01-29 NOTE — Telephone Encounter (Signed)
Order canceled

## 2020-02-20 ENCOUNTER — Other Ambulatory Visit: Payer: Medicare HMO

## 2020-02-20 ENCOUNTER — Other Ambulatory Visit: Payer: Self-pay | Admitting: Physician Assistant

## 2020-02-20 DIAGNOSIS — R600 Localized edema: Secondary | ICD-10-CM

## 2020-02-20 DIAGNOSIS — I6339 Cerebral infarction due to thrombosis of other cerebral artery: Secondary | ICD-10-CM

## 2020-02-20 DIAGNOSIS — I10 Essential (primary) hypertension: Secondary | ICD-10-CM

## 2020-02-27 DIAGNOSIS — R3915 Urgency of urination: Secondary | ICD-10-CM | POA: Diagnosis not present

## 2020-02-29 DIAGNOSIS — M25572 Pain in left ankle and joints of left foot: Secondary | ICD-10-CM | POA: Diagnosis not present

## 2020-02-29 DIAGNOSIS — M25571 Pain in right ankle and joints of right foot: Secondary | ICD-10-CM | POA: Diagnosis not present

## 2020-03-01 IMAGING — DX DG CHEST 2V
2 series · 2 of 2 positions shown · non-contrast
Comparison: None.

CLINICAL DATA: Shortness of breath and wheezing for 2 months.

EXAM:
CHEST - 2 VIEW

[chest pa]
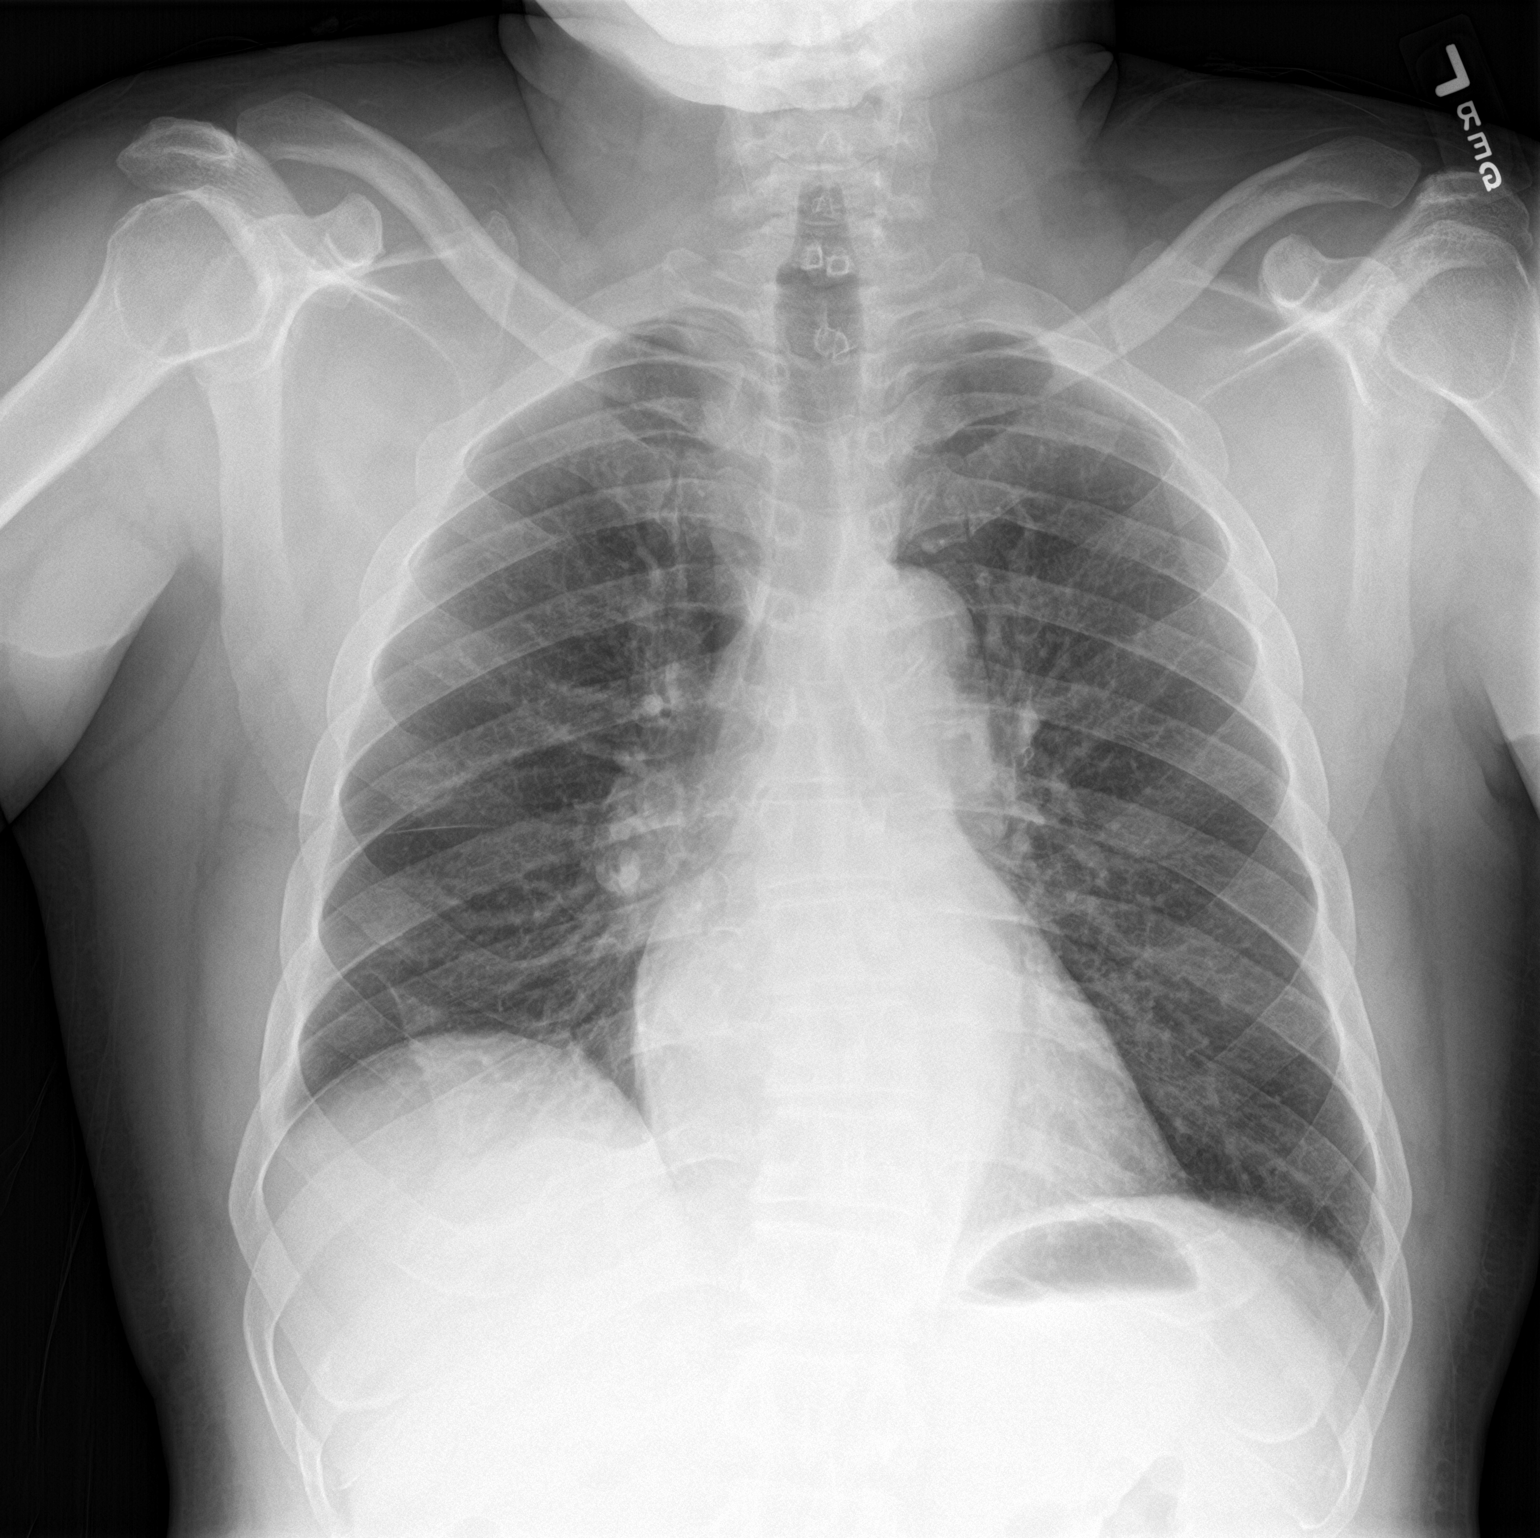

[chest lat]
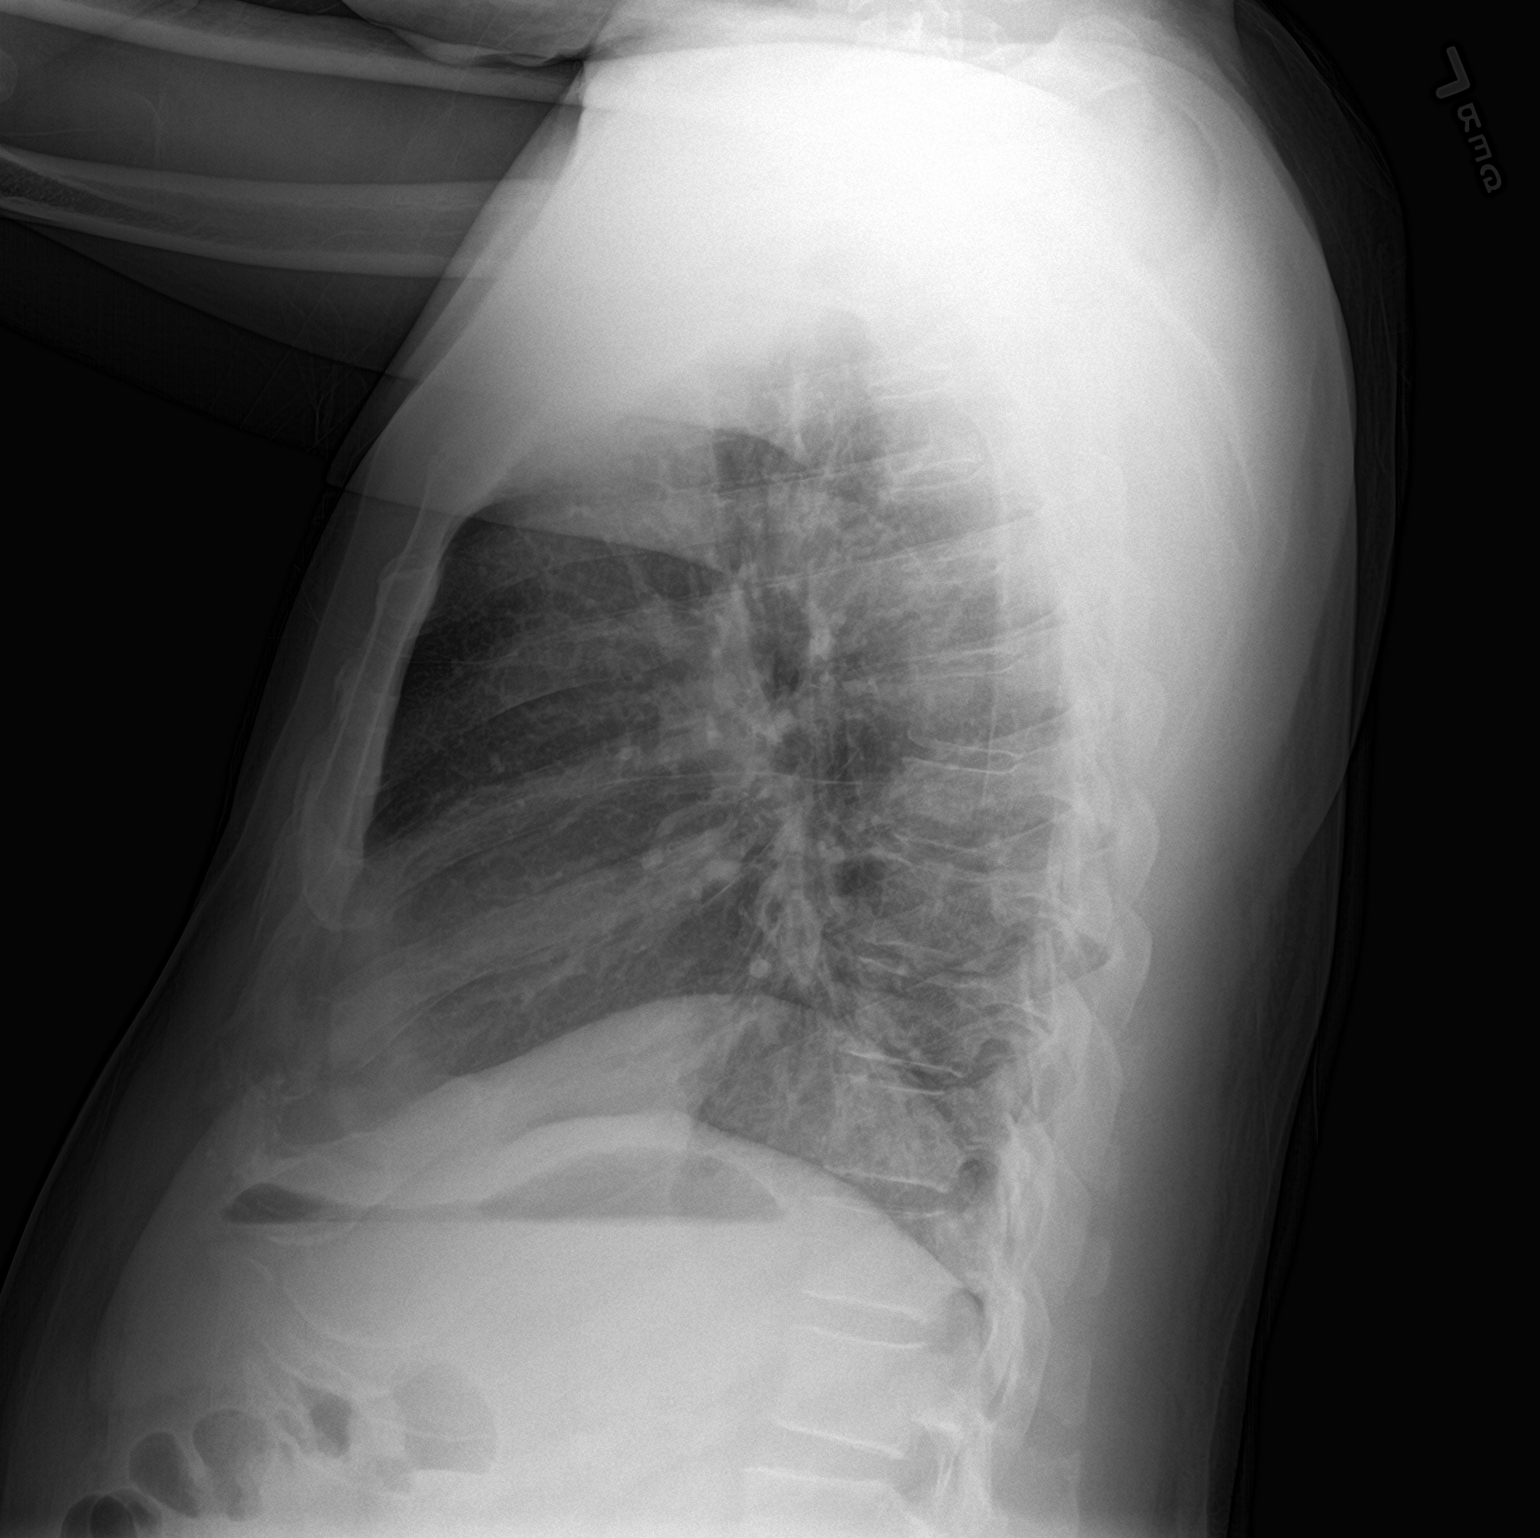

[2 of 2 positions shown; findings below may reference images not displayed]

FINDINGS: Streaky LEFT LOWER lobe opacity is now noted and may represent
pneumonia or mild atelectasis.

The cardiomediastinal silhouette is unremarkable.

There is no evidence of pulmonary edema, suspicious pulmonary
nodule/mass, pleural effusion, or pneumothorax.

No acute bony abnormalities are identified.
IMPRESSION: Streaky LEFT LOWER lobe opacity-question pneumonia versus mild
atelectasis.

## 2020-03-06 ENCOUNTER — Other Ambulatory Visit: Payer: Self-pay | Admitting: Neurology

## 2020-03-06 MED ORDER — MONTELUKAST SODIUM 10 MG PO TABS: 10.0000 mg | ORAL_TABLET | Freq: Every day | ORAL | 1 refills | Status: DC

## 2020-03-13 ENCOUNTER — Other Ambulatory Visit: Payer: Self-pay | Admitting: Family Medicine

## 2020-03-13 DIAGNOSIS — F419 Anxiety disorder, unspecified: Secondary | ICD-10-CM

## 2020-03-19 ENCOUNTER — Other Ambulatory Visit: Payer: Self-pay | Admitting: Physician Assistant

## 2020-03-19 DIAGNOSIS — I10 Essential (primary) hypertension: Secondary | ICD-10-CM

## 2020-03-26 IMAGING — US US ABDOMEN COMPLETE
1 series · 14 of 25 positions shown · non-contrast
Comparison: None.

CLINICAL DATA: 68-year-old male with abdominal distention and
bloating.

EXAM:
ABDOMEN ULTRASOUND COMPLETE

[Series 1: us abdomen complete · 0.20mm/px · 14 of 150 slices shown]
[im 1/150]
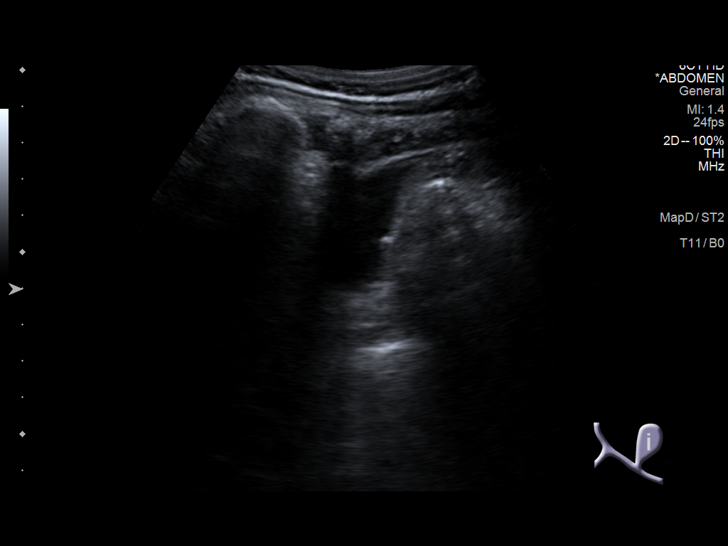
[im 13/150]
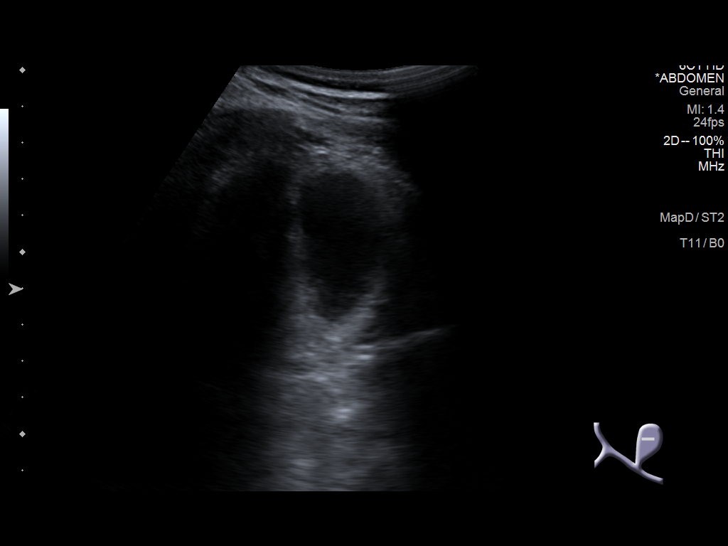
[im 25/150]
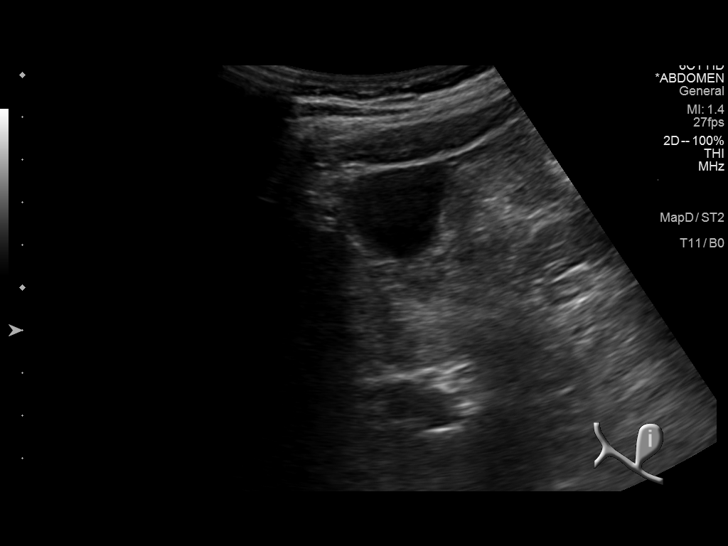
[im 38/150]
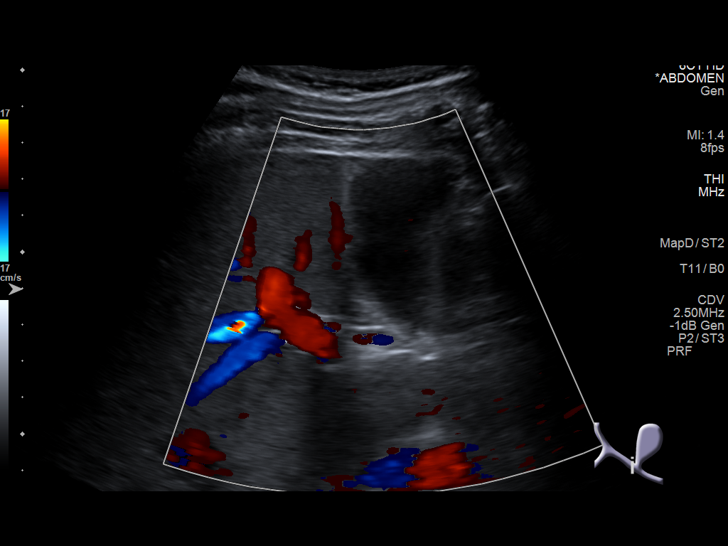
[im 50/150]
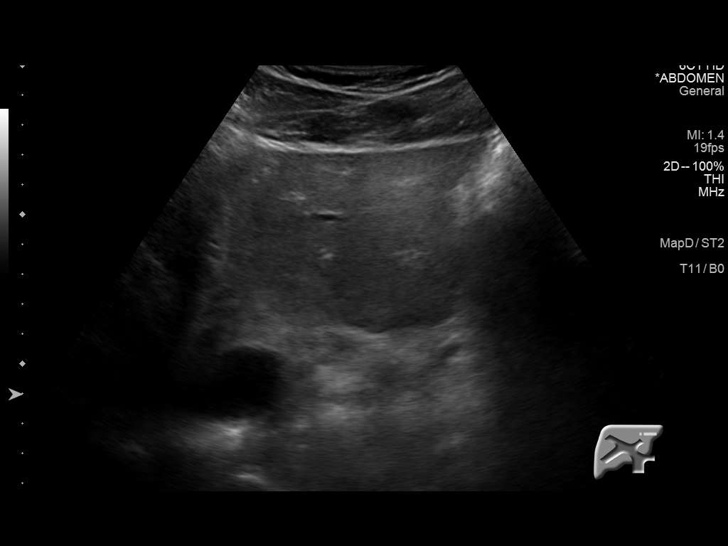
[im 56/150]
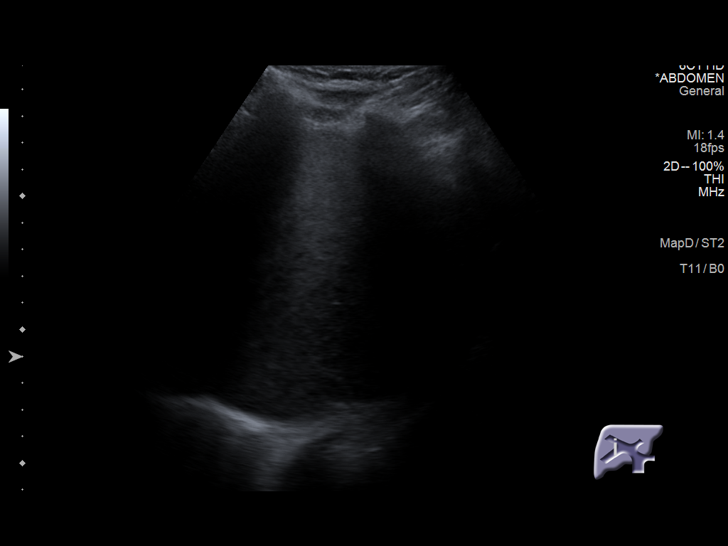
[im 69/150]
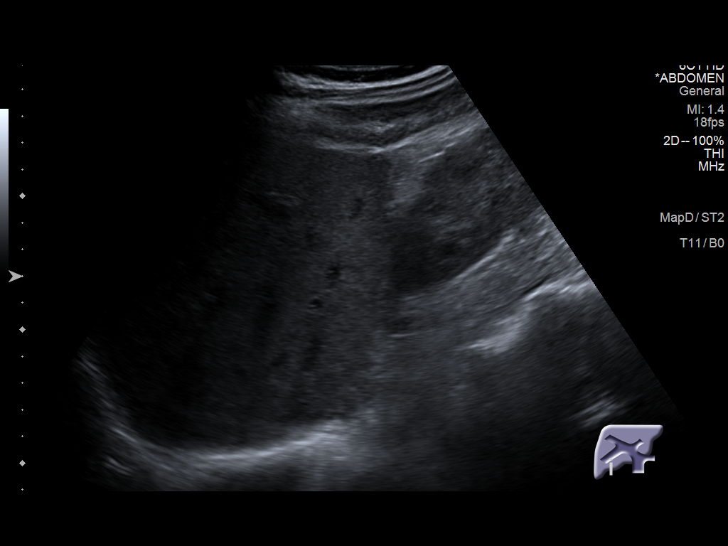
[im 81/150]
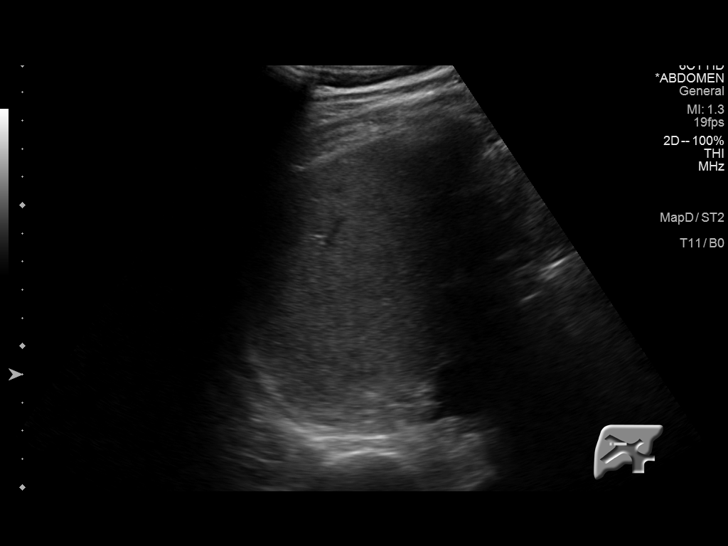
[im 94/150]
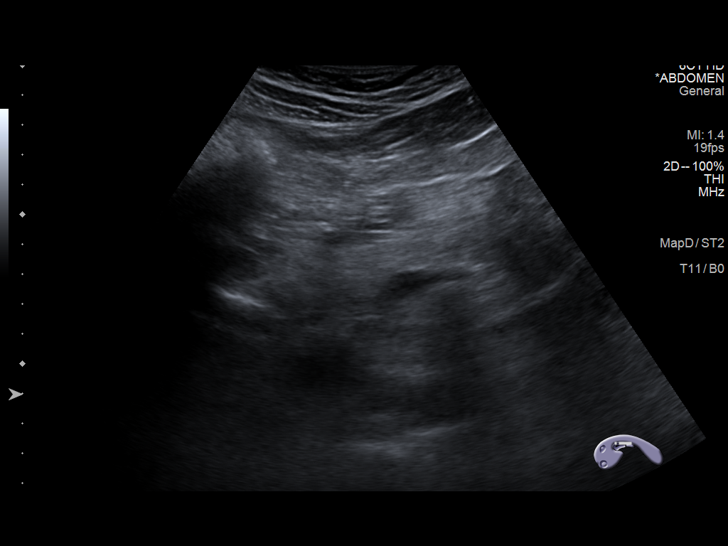
[im 100/150]
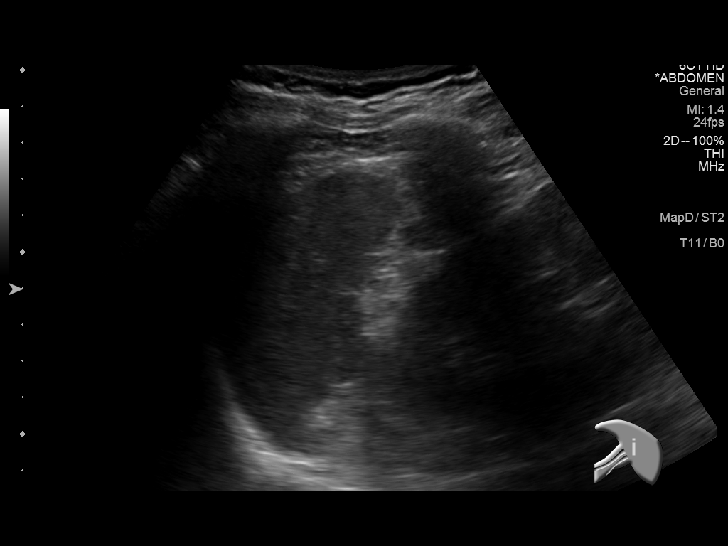
[im 112/150]
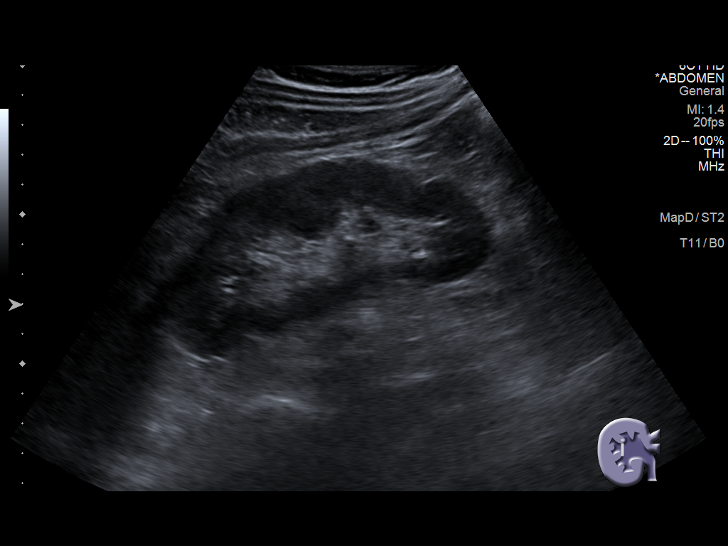
[im 125/150]
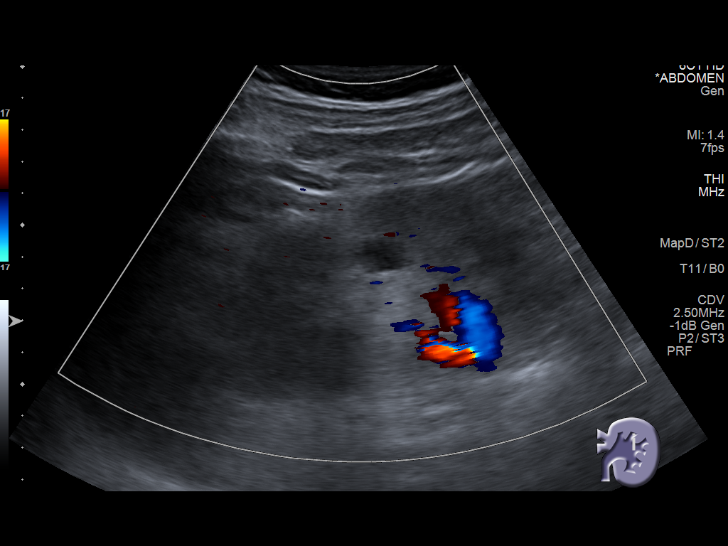
[im 137/150]
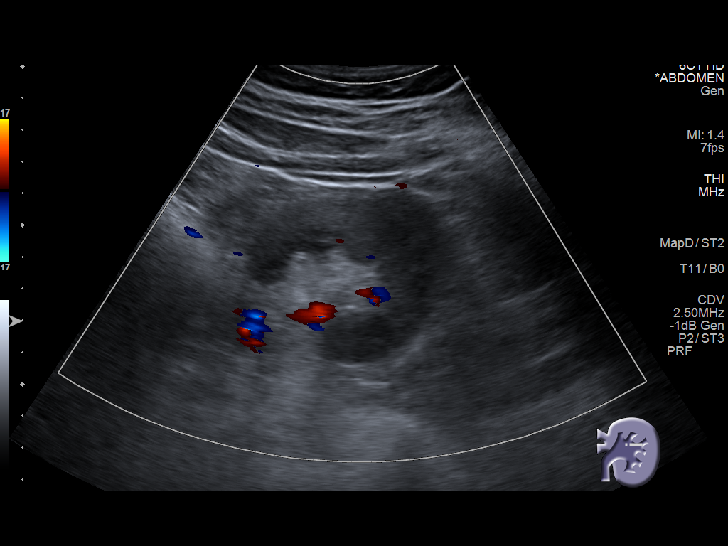
[im 150/150]
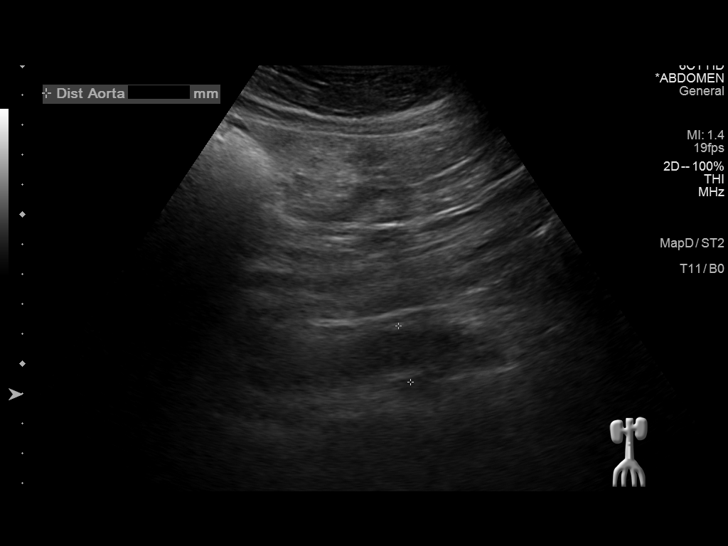

[14 of 25 positions shown; findings below may reference images not displayed]

FINDINGS: Gallbladder: Small gallstone. No gallbladder wall thickening or
pericholecystic fluid. Negative sonographic Murphy's sign.

Common bile duct: Diameter: 2 mm

Liver: The liver is grossly unremarkable as visualized. Portal vein
is patent on color Doppler imaging with normal direction of blood
flow towards the liver.

IVC: No abnormality visualized.

Pancreas: Visualized portion unremarkable.

Spleen: Size and appearance within normal limits.

Right Kidney: Length: 11 cm. Normal echogenicity. No hydronephrosis
or shadowing stone.

Left Kidney: Length: 12 cm. Normal echogenicity. No hydronephrosis
or shadowing stone. There is a 1.9 x 1.1 x 1.8 cm left renal
interpolar cyst.

Abdominal aorta: There is atherosclerotic calcification of the
abdominal aorta. The aorta is ectatic measuring 2.8 cm in diameter.

Other findings: None.
IMPRESSION: Cholelithiasis without sonographic evidence of acute cholecystitis.

## 2020-03-29 ENCOUNTER — Other Ambulatory Visit: Payer: Self-pay | Admitting: Physician Assistant

## 2020-03-29 DIAGNOSIS — R062 Wheezing: Secondary | ICD-10-CM

## 2020-03-29 DIAGNOSIS — R69 Illness, unspecified: Secondary | ICD-10-CM | POA: Diagnosis not present

## 2020-03-29 DIAGNOSIS — R0689 Other abnormalities of breathing: Secondary | ICD-10-CM

## 2020-03-29 DIAGNOSIS — J449 Chronic obstructive pulmonary disease, unspecified: Secondary | ICD-10-CM

## 2020-03-29 DIAGNOSIS — J189 Pneumonia, unspecified organism: Secondary | ICD-10-CM

## 2020-04-01 ENCOUNTER — Other Ambulatory Visit: Payer: Self-pay | Admitting: Physician Assistant

## 2020-04-01 DIAGNOSIS — I6339 Cerebral infarction due to thrombosis of other cerebral artery: Secondary | ICD-10-CM

## 2020-04-01 DIAGNOSIS — I10 Essential (primary) hypertension: Secondary | ICD-10-CM

## 2020-04-11 ENCOUNTER — Other Ambulatory Visit: Payer: Self-pay | Admitting: Physician Assistant

## 2020-04-11 DIAGNOSIS — I10 Essential (primary) hypertension: Secondary | ICD-10-CM

## 2020-04-11 DIAGNOSIS — R69 Illness, unspecified: Secondary | ICD-10-CM | POA: Diagnosis not present

## 2020-04-15 ENCOUNTER — Ambulatory Visit (INDEPENDENT_AMBULATORY_CARE_PROVIDER_SITE_OTHER): Payer: Medicare HMO | Admitting: Physician Assistant

## 2020-04-15 ENCOUNTER — Encounter: Payer: Self-pay | Admitting: Physician Assistant

## 2020-04-15 ENCOUNTER — Other Ambulatory Visit: Payer: Self-pay

## 2020-04-15 VITALS — BP 140/52 | HR 89 | Temp 98.7°F | Wt 174.1 lb

## 2020-04-15 DIAGNOSIS — D696 Thrombocytopenia, unspecified: Secondary | ICD-10-CM | POA: Diagnosis not present

## 2020-04-15 DIAGNOSIS — N400 Enlarged prostate without lower urinary tract symptoms: Secondary | ICD-10-CM

## 2020-04-15 DIAGNOSIS — R600 Localized edema: Secondary | ICD-10-CM

## 2020-04-15 DIAGNOSIS — Z125 Encounter for screening for malignant neoplasm of prostate: Secondary | ICD-10-CM

## 2020-04-15 DIAGNOSIS — I6339 Cerebral infarction due to thrombosis of other cerebral artery: Secondary | ICD-10-CM | POA: Diagnosis not present

## 2020-04-15 DIAGNOSIS — R062 Wheezing: Secondary | ICD-10-CM

## 2020-04-15 DIAGNOSIS — I10 Essential (primary) hypertension: Secondary | ICD-10-CM | POA: Diagnosis not present

## 2020-04-15 DIAGNOSIS — Z131 Encounter for screening for diabetes mellitus: Secondary | ICD-10-CM

## 2020-04-15 DIAGNOSIS — G479 Sleep disorder, unspecified: Secondary | ICD-10-CM

## 2020-04-15 MED ORDER — FUROSEMIDE 20 MG PO TABS: 20.0000 mg | ORAL_TABLET | Freq: Every day | ORAL | 1 refills | Status: DC

## 2020-04-15 MED ORDER — BUDESONIDE-FORMOTEROL FUMARATE 160-4.5 MCG/ACT IN AERO: 2.0000 | INHALATION_SPRAY | Freq: Two times a day (BID) | RESPIRATORY_TRACT | 1 refills | Status: DC

## 2020-04-15 MED ORDER — LOSARTAN POTASSIUM 50 MG PO TABS: 50.0000 mg | ORAL_TABLET | Freq: Every day | ORAL | 0 refills | Status: DC

## 2020-04-15 MED ORDER — AMLODIPINE BESYLATE 10 MG PO TABS: 10.0000 mg | ORAL_TABLET | Freq: Every day | ORAL | 1 refills | Status: DC

## 2020-04-15 MED ORDER — METOPROLOL SUCCINATE ER 100 MG PO TB24: 100.0000 mg | ORAL_TABLET | Freq: Every day | ORAL | 3 refills | Status: DC

## 2020-04-15 MED ORDER — HYDRALAZINE HCL 25 MG PO TABS: 25.0000 mg | ORAL_TABLET | Freq: Three times a day (TID) | ORAL | 1 refills | Status: DC

## 2020-04-15 MED ORDER — TRAZODONE HCL 100 MG PO TABS: ORAL_TABLET | ORAL | 1 refills | Status: DC

## 2020-04-15 NOTE — Progress Notes (Signed)
Subjective:    Patient ID: Travis Barajas, male    DOB: 04-17-1951, 69 y.o.   MRN: [id number]  HPI  Pt is a 69 yo male with hx of CVA, HTN, COPD, GERD, BPH, CAD who presents to the clinic for follow up.   He is taking his medications daily. He denies any CP, palpitations, SOB. He continues to get stronger daily. He is taking some magnesium and other supplements that seem to be helping. No significant problems or concerns. Taking BP at home and ranging 130's over 70's.    He continues to have some problems sleeping. He is taking trazodone. Which does help some. He would like to get off medication for sleep.   Breathing is doing great. No concerns. Not smoking.    No significant urinary concerns.    .. Active Ambulatory Problems    Diagnosis Date Noted  . GERD (gastroesophageal reflux disease) 07/23/2011  . Allergic rhinitis 07/23/2011  . Joint pain 07/23/2011  . HTN (hypertension), malignant 10/01/2011  . Tobacco user 12/05/2011  . SOB (shortness of breath) 10/18/2012  . Hematuria 11/07/2013  . Hyperlipidemia 11/17/2013  . COPD, moderate ([place]) 03/21/2014  . Erectile dysfunction 03/21/2014  . Claudication ([place]) 09/26/2014  . Generalized anxiety disorder 01/15/2015  . BPH without urinary obstruction 05/25/2016  . Acute nonseasonal allergic rhinitis due to pollen 05/04/2017  . Dyslipidemia 05/04/2017  . Current smoker 09/01/2017  . Hemoptysis 09/01/2017  . Left carotid bruit 09/01/2017  . Systolic murmur 16/38/4665  . Trouble in sleeping 10/05/2018  . PVD (peripheral vascular disease) with claudication ([place]) 02/22/2019  . Thrombocytopenia ([place]) 02/24/2019  . Slurred speech 02/24/2019  . Dizziness 02/24/2019  . Dysphagia 02/24/2019  . TIA (transient ischemic attack) 02/24/2019  . Intercostal retractions 03/31/2019  . Cerebrovascular accident ([place]) 04/03/2019  . Distended abdomen 04/14/2019  . Uncontrolled hypertension 04/14/2019  . 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 Renal cyst, left 04/26/2019  . Gallstone 04/26/2019  . Lower extremity edema 06/15/2019  . Anxiety 06/15/2019  . Wheezing 07/24/2019  . COPD with acute exacerbation ([place]) 07/24/2019  . Left thigh pain 07/24/2019  . Acute left-sided low back pain with left-sided sciatica 07/24/2019  . Pulmonary nodules 07/31/2019  . Change in voice 09/01/2019  . Expiratory stridor 09/01/2019  . Choking 09/01/2019  . Abnormal CT scan, neck 09/01/2019  . 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 Bilateral hand swelling 01/05/2020  . Plantar fasciitis 01/05/2020   Resolved Ambulatory Problems    Diagnosis Date Noted  . BPH (benign prostatic hyperplasia) 11/21/2013  . Pneumonia of left lower lobe due to infectious organism 04/03/2019   Past Medical History:  Diagnosis Date  . Allergy   . Arthritis   . COPD (chronic obstructive pulmonary disease) ([place])   . Hypertension   . Stroke [facility])       Review of Systems  All other systems reviewed and are negative.      Objective:   Physical Exam Vitals reviewed.  Constitutional:      Appearance: Normal appearance.  HENT:     Head: Normocephalic.  Eyes:     Conjunctiva/sclera: Conjunctivae normal.  Cardiovascular:     Rate and Rhythm: Normal rate and regular rhythm.     Pulses: Normal pulses.     Heart sounds: Murmur heard.   Pulmonary:     Effort: Pulmonary effort is normal.     Breath sounds: Normal breath sounds.  Abdominal:     General: Bowel sounds are  normal. There is no distension.     Palpations: Abdomen is soft.     Tenderness: There is no abdominal tenderness.  Musculoskeletal:     Right lower leg: No edema.     Left lower leg: No edema.  Neurological:     General: No focal deficit present.     Mental Status: He is alert and oriented to person, place, and time.  Psychiatric:        Mood and Affect: Mood normal.           Assessment & Plan:  Marland KitchenMarland KitchenCrockett Barajas was seen today for follow-up.  Diagnoses and all  orders for this visit:  Uncontrolled hypertension -     amLODipine (NORVASC) 10 MG tablet; Take 1 tablet (10 mg total) by mouth daily. -     hydrALAZINE (APRESOLINE) 25 MG tablet; Take 1 tablet (25 mg total) by mouth 3 (three) times daily. -     metoprolol succinate (TOPROL-XL) 100 MG 24 hr tablet; Take 1 tablet (100 mg total) by mouth daily. Take with or immediately following a meal. -     COMPLETE METABOLIC PANEL WITH GFR -     losartan (COZAAR) 50 MG tablet; Take 1 tablet (50 mg total) by mouth daily.  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 -     Lipid Panel w/reflex Direct LDL -     losartan (COZAAR) 50 MG tablet; Take 1 tablet (50 mg total) by mouth daily.  Lower extremity edema -     furosemide (LASIX) 20 MG tablet; Take 1 tablet (20 mg total) by mouth daily. -     TSH  Wheezing -     budesonide-formoterol (SYMBICORT) 160-4.5 MCG/ACT inhaler; Inhale 2 puffs into the lungs 2 (two) times daily.  Screening for diabetes mellitus -     COMPLETE METABOLIC PANEL WITH GFR  Decreased platelet count (HCC) -     CBC  Prostate cancer screening -     PSA  Benign prostatic hyperplasia without lower urinary tract symptoms -     PSA  Trouble in sleeping -     traZODone (DESYREL) 100 MG tablet; Take one and one half tablet at bedtime.   Needs labs.  Will adjust medications as needed.   Discussed sleep and sleep hygiene. Discussed natural melatonin and valarian root. Ok to increase trazodone to 1 and 1/2 tablet at bedtime.   COPD controlled with symbicort and ventolin as needed.   BP under 150/90 but with hx of stroke goal would be under 130/90. Continue to monitor at home. Last visit BP was too low with and decreased cozzar. Stay on same medications for now. Keep BP log and bring back in. Watch salt in diet.   Follow up in 3 months.

## 2020-04-15 NOTE — Patient Instructions (Signed)
Melatonin 3-5mg /valarian root natural ways to help with sleep.  I sent trazodone 1 to 1 and 1/2 tablet at bedtime.

## 2020-04-17 DIAGNOSIS — I6339 Cerebral infarction due to thrombosis of other cerebral artery: Secondary | ICD-10-CM | POA: Diagnosis not present

## 2020-04-17 DIAGNOSIS — Z125 Encounter for screening for malignant neoplasm of prostate: Secondary | ICD-10-CM | POA: Diagnosis not present

## 2020-04-17 DIAGNOSIS — D696 Thrombocytopenia, unspecified: Secondary | ICD-10-CM | POA: Diagnosis not present

## 2020-04-17 DIAGNOSIS — N400 Enlarged prostate without lower urinary tract symptoms: Secondary | ICD-10-CM | POA: Diagnosis not present

## 2020-04-17 DIAGNOSIS — I10 Essential (primary) hypertension: Secondary | ICD-10-CM | POA: Diagnosis not present

## 2020-04-17 DIAGNOSIS — R600 Localized edema: Secondary | ICD-10-CM | POA: Diagnosis not present

## 2020-04-17 DIAGNOSIS — Z131 Encounter for screening for diabetes mellitus: Secondary | ICD-10-CM | POA: Diagnosis not present

## 2020-04-18 LAB — PSA: PSA: 1.05 ng/mL (ref <=4.0)

## 2020-04-18 LAB — LIPID PANEL W/REFLEX DIRECT LDL
Cholesterol: 199 mg/dL (ref <200)
HDL: 39 mg/dL — ABNORMAL LOW (ref >=40)
LDL Cholesterol (Calc): 135 mg/dL (calc) — ABNORMAL HIGH
Non-HDL Cholesterol (Calc): 160 mg/dL (calc) — ABNORMAL HIGH (ref <130)
Total CHOL/HDL Ratio: 5.1 (calc) — ABNORMAL HIGH (ref <5.0)
Triglycerides: 123 mg/dL (ref <150)

## 2020-04-18 LAB — CBC
HCT: 43.4 % (ref 38.5–50.0)
Hemoglobin: 14 g/dL (ref 13.2–17.1)
MCH: 26.4 pg — ABNORMAL LOW (ref 27.0–33.0)
MCHC: 32.3 g/dL (ref 32.0–36.0)
MCV: 81.7 fL (ref 80.0–100.0)
Platelets: 138 10*3/uL — ABNORMAL LOW (ref 140–400)
RBC: 5.31 10*6/uL (ref 4.20–5.80)
RDW: 13.9 % (ref 11.0–15.0)
WBC: 7.2 10*3/uL (ref 3.8–10.8)

## 2020-04-18 LAB — COMPLETE METABOLIC PANEL WITH GFR
AG Ratio: 1.7 (calc) (ref 1.0–2.5)
ALT: 12 U/L (ref 9–46)
AST: 15 U/L (ref 10–35)
Albumin: 4.1 g/dL (ref 3.6–5.1)
Alkaline phosphatase (APISO): 74 U/L (ref 35–144)
BUN: 12 mg/dL (ref 7–25)
CO2: 29 mmol/L (ref 20–32)
Calcium: 9.4 mg/dL (ref 8.6–10.3)
Chloride: 106 mmol/L (ref 98–110)
Creat: 0.93 mg/dL (ref 0.70–1.25)
GFR, Est African American: 97 mL/min/{1.73_m2} (ref >=60)
GFR, Est Non African American: 84 mL/min/{1.73_m2} (ref >=60)
Globulin: 2.4 g/dL (calc) (ref 1.9–3.7)
Glucose, Bld: 88 mg/dL (ref 65–99)
Potassium: 3.7 mmol/L (ref 3.5–5.3)
Sodium: 142 mmol/L (ref 135–146)
Total Bilirubin: 0.4 mg/dL (ref 0.2–1.2)
Total Protein: 6.5 g/dL (ref 6.1–8.1)

## 2020-04-18 LAB — TSH: TSH: 1.81 mIU/L (ref 0.40–4.50)

## 2020-04-19 ENCOUNTER — Other Ambulatory Visit: Payer: Self-pay | Admitting: *Deleted

## 2020-04-19 NOTE — Progress Notes (Signed)
Call patient:  Cholesterol is still not to goal. Are you taking pitavastatin?  Kidney, liver, glucose look good.  PsA stable and great.

## 2020-04-26 ENCOUNTER — Telehealth: Payer: Self-pay | Admitting: Physician Assistant

## 2020-04-26 NOTE — Telephone Encounter (Signed)
Pt left a voicemail requesting an appointment. I called pt back at 12:04 and he said he's having chest pain and not sure if its a pulled muscle or what so I consulted Iran Planas, PA and she had me to tell patient to go to the Urgent Care. We do not have any openings in our office today.

## 2020-05-01 ENCOUNTER — Other Ambulatory Visit: Payer: Self-pay | Admitting: Neurology

## 2020-05-01 DIAGNOSIS — N400 Enlarged prostate without lower urinary tract symptoms: Secondary | ICD-10-CM

## 2020-05-01 DIAGNOSIS — J302 Other seasonal allergic rhinitis: Secondary | ICD-10-CM

## 2020-05-01 MED ORDER — LEVOCETIRIZINE DIHYDROCHLORIDE 5 MG PO TABS: 5.0000 mg | ORAL_TABLET | Freq: Every day | ORAL | 0 refills | Status: DC

## 2020-05-01 MED ORDER — DOXAZOSIN MESYLATE 4 MG PO TABS: 4.0000 mg | ORAL_TABLET | Freq: Two times a day (BID) | ORAL | 0 refills | Status: DC

## 2020-05-03 ENCOUNTER — Ambulatory Visit (INDEPENDENT_AMBULATORY_CARE_PROVIDER_SITE_OTHER): Payer: Medicare HMO | Admitting: Physician Assistant

## 2020-05-03 ENCOUNTER — Other Ambulatory Visit: Payer: Self-pay

## 2020-05-03 ENCOUNTER — Encounter: Payer: Self-pay | Admitting: Physician Assistant

## 2020-05-03 VITALS — BP 157/63 | HR 72 | Ht 63.0 in | Wt 172.0 lb

## 2020-05-03 DIAGNOSIS — R69 Illness, unspecified: Secondary | ICD-10-CM | POA: Diagnosis not present

## 2020-05-03 DIAGNOSIS — I739 Peripheral vascular disease, unspecified: Secondary | ICD-10-CM

## 2020-05-03 DIAGNOSIS — I10 Essential (primary) hypertension: Secondary | ICD-10-CM

## 2020-05-03 DIAGNOSIS — F419 Anxiety disorder, unspecified: Secondary | ICD-10-CM | POA: Diagnosis not present

## 2020-05-03 DIAGNOSIS — E782 Mixed hyperlipidemia: Secondary | ICD-10-CM

## 2020-05-03 DIAGNOSIS — R252 Cramp and spasm: Secondary | ICD-10-CM | POA: Diagnosis not present

## 2020-05-03 MED ORDER — ALPRAZOLAM 0.5 MG PO TABS: 0.5000 mg | ORAL_TABLET | Freq: Two times a day (BID) | ORAL | 1 refills | Status: DC | PRN

## 2020-05-03 NOTE — Patient Instructions (Signed)
Leg Cramps Leg cramps occur when one or more muscles tighten and you have no control over this tightening (involuntary muscle contraction). Muscle cramps can develop in any muscle, but the most common place is in the calf muscles of the leg. Those cramps can occur during exercise or when you are at rest. Leg cramps are painful, and they may last for a few seconds to a few minutes. Cramps may return several times before they finally stop. Usually, leg cramps are not caused by a serious medical problem. In many cases, the cause is not known. Some common causes include:  Excessive physical effort (overexertion), such as during intense exercise.  Overuse from repetitive motions, or doing the same thing over and over.  Staying in a certain position for a long period of time.  Improper preparation, form, or technique while performing a sport or an activity.  Dehydration.  Injury.  Side effects of certain medicines.  Abnormally low levels of minerals in your blood (electrolytes), especially potassium and calcium. This could result from: ? Pregnancy. ? Taking diuretic medicines. Follow these instructions at home: Eating and drinking  Drink enough fluid to keep your urine pale yellow. Staying hydrated may help prevent cramps.  Eat a healthy diet that includes plenty of nutrients to help your muscles function. A healthy diet includes fruits and vegetables, lean protein, whole grains, and low-fat or nonfat dairy products. Managing pain, stiffness, and swelling      Try massaging, stretching, and relaxing the affected muscle. Do this for several minutes at a time.  If directed, put ice on areas that are sore or painful after a cramp: ? Put ice in a plastic bag. ? Place a towel between your skin and the bag. ? Leave the ice on for 20 minutes, 2-3 times a day.  If directed, apply heat to muscles that are tense or tight. Do this before you exercise, or as often as told by your health care  provider. Use the heat source that your health care provider recommends, such as a moist heat pack or a heating pad. ? Place a towel between your skin and the heat source. ? Leave the heat on for 20-30 minutes. ? Remove the heat if your skin turns bright red. This is especially important if you are unable to feel pain, heat, or cold. You may have a greater risk of getting burned.  Try taking hot showers or baths to help relax tight muscles. General instructions  If you are having frequent leg cramps, avoid intense exercise for several days.  Take over-the-counter and prescription medicines only as told by your health care provider.  Keep all follow-up visits as told by your health care provider. This is important. Contact a health care provider if:  Your leg cramps get more severe or more frequent, or they do not improve over time.  Your foot becomes cold, numb, or blue. Summary  Muscle cramps can develop in any muscle, but the most common place is in the calf muscles of the leg.  Leg cramps are painful, and they may last for a few seconds to a few minutes.  Usually, leg cramps are not caused by a serious medical problem. Often, the cause is not known.  Stay hydrated and take over-the-counter and prescription medicines only as told by your health care provider. This information is not intended to replace advice given to you by your health care provider. Make sure you discuss any questions you have with your health care  provider. Document Revised: 05/14/2017 Document Reviewed: 03/11/2017 Elsevier Patient Education  2020 [employer].

## 2020-05-03 NOTE — Progress Notes (Signed)
Subjective:    Patient ID: Travis Barajas, male    DOB: 03/30/1951, 69 y.o.   MRN: [id number]  HPI  Patient is a 69 year old male with hypertension, history of CVA, PVD, COPD, anxiety who presents to the clinic with some intermittent leg cramping.  This is not been happening very often.  It is not associated with walking or exertion.  This is been happening at rest when he is sitting.  He feels his calves getting tight and aching.  Usually walking or getting up or massage helps it.  He denies any injury to his legs.  He denies any shortness of breath out of normal.  He is taking his Plavix and Livalo.  He is only taking Livalo 3 times a week as it was started like this because of his past history of statin intolerance.  He is tolerating the statin very well. No leg swelling or bruising. Pain resolved today.   He did not take his blood pressure medication this morning.  He denies any chest pain, palpitations, headaches, vision changes.  He is taking his blood pressure at home and reports readings 130s to 140s over 70s.  .. Active Ambulatory Problems    Diagnosis Date Noted  . GERD (gastroesophageal reflux disease) 07/23/2011  . Allergic rhinitis 07/23/2011  . Joint pain 07/23/2011  . HTN (hypertension), malignant 10/01/2011  . Tobacco user 12/05/2011  . SOB (shortness of breath) 10/18/2012  . Hematuria 11/07/2013  . Hyperlipidemia 11/17/2013  . COPD, moderate ([place]) 03/21/2014  . Erectile dysfunction 03/21/2014  . Claudication ([place]) 09/26/2014  . Generalized anxiety disorder 01/15/2015  . BPH without urinary obstruction 05/25/2016  . Acute nonseasonal allergic rhinitis due to pollen 05/04/2017  . Dyslipidemia 05/04/2017  . Current smoker 09/01/2017  . Hemoptysis 09/01/2017  . Left carotid bruit 09/01/2017  . Systolic murmur 22/97/9892  . Trouble in sleeping 10/05/2018  . PVD (peripheral vascular disease) with claudication ([place]) 02/22/2019  .  Thrombocytopenia ([place]) 02/24/2019  . Slurred speech 02/24/2019  . Dizziness 02/24/2019  . Dysphagia 02/24/2019  . TIA (transient ischemic attack) 02/24/2019  . Intercostal retractions 03/31/2019  . Cerebrovascular accident ([place]) 04/03/2019  . Distended abdomen 04/14/2019  . Primary hypertension 04/14/2019  . 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 Renal cyst, left 04/26/2019  . Gallstone 04/26/2019  . Lower extremity edema 06/15/2019  . Anxiety 06/15/2019  . Wheezing 07/24/2019  . COPD with acute exacerbation ([place]) 07/24/2019  . Left thigh pain 07/24/2019  . Acute left-sided low back pain with left-sided sciatica 07/24/2019  . Pulmonary nodules 07/31/2019  . Change in voice 09/01/2019  . Expiratory stridor 09/01/2019  . Choking 09/01/2019  . Abnormal CT scan, neck 09/01/2019  . 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 Bilateral hand swelling 01/05/2020  . Plantar fasciitis 01/05/2020   Resolved Ambulatory Problems    Diagnosis Date Noted  . BPH (benign prostatic hyperplasia) 11/21/2013  . Pneumonia of left lower lobe due to infectious organism 04/03/2019   Past Medical History:  Diagnosis Date  . Allergy   . Arthritis   . COPD (chronic obstructive pulmonary disease) ([place])   . Hypertension   . Stroke [facility])      Review of Systems  All other systems reviewed and are negative.      Objective:   Physical Exam Vitals reviewed.  Constitutional:      Appearance: Normal appearance.  Cardiovascular:     Rate and Rhythm: Normal rate and regular rhythm.     Pulses:  Normal pulses.  Pulmonary:     Effort: Pulmonary effort is normal.     Breath sounds: Normal breath sounds.  Musculoskeletal:     Right lower leg: No edema.     Left lower leg: No edema.     Comments: No calf swelling, redness, warmth or tenderness, bilaterally.  NROM of bilateral knees.   Neurological:     General: No focal deficit present.     Mental Status: He is alert and oriented to person, place,  and time.  Psychiatric:        Mood and Affect: Mood normal.           Assessment & Plan:  Marland KitchenMarland KitchenCastor Gittleman was seen today for leg pain.  Diagnoses and all orders for this visit:  Leg cramping  Anxiety -     ALPRAZolam (XANAX) 0.5 MG tablet; Take 1 tablet (0.5 mg total) by mouth 2 (two) times daily as needed. for anxiety  PVD (peripheral vascular disease) with claudication (HCC) -     LIVALO 1 MG TABS; Take 1 tablet (1 mg total) by mouth daily.  Primary hypertension  Mixed hyperlipidemia -     LIVALO 1 MG TABS; Take 1 tablet (1 mg total) by mouth daily.   Pt has PVD and history of some claudication but this does not sound like claudication. He is having a leg cramp at rest. Discussed hydration, massage, icing. Resolved today. Labs just done 2 weeks ago and electrolytes were normal. If persist will do more work up.   For PVD/CVA and overall CV risk reduction continue on livalo, plavix. If starts having leg pain with walking please follow up. STAY away from any smoking. Pt has been taking livalo 3 times a week because of previous intolerance to statins. He agrees to start daily to see if he can tolerate daily. LDL is not to goal.   Pt did not take BP medication today. He is keeping log at home and runing 130's over 70s.  Refilled xanax for as needed usage. Discussed risk of fall and dependency. Use sparingly.   Spent 30 minutes with patient discuss CV risk and treatment plan.

## 2020-05-07 ENCOUNTER — Telehealth: Payer: Self-pay

## 2020-05-07 ENCOUNTER — Encounter: Payer: Self-pay | Admitting: Physician Assistant

## 2020-05-07 MED ORDER — ZYPITAMAG 1 MG PO TABS: 1.0000 mg | ORAL_TABLET | Freq: Every day | ORAL | 3 refills | Status: DC

## 2020-05-07 MED ORDER — LIVALO 1 MG PO TABS: 1.0000 mg | ORAL_TABLET | Freq: Every day | ORAL | 3 refills | Status: DC

## 2020-05-07 NOTE — Telephone Encounter (Signed)
Medication sent.

## 2020-05-07 NOTE — Telephone Encounter (Signed)
[facility] called and states the patient would like to go back on Zypitamag instead of the Livalo. Please advise.

## 2020-05-07 NOTE — Telephone Encounter (Signed)
Yes that is ok can we send it like that so that is what on the chart when I refill them, thanks.

## 2020-05-14 DIAGNOSIS — R3915 Urgency of urination: Secondary | ICD-10-CM | POA: Diagnosis not present

## 2020-05-23 DIAGNOSIS — N529 Male erectile dysfunction, unspecified: Secondary | ICD-10-CM | POA: Diagnosis not present

## 2020-05-23 DIAGNOSIS — Z8673 Personal history of transient ischemic attack (TIA), and cerebral infarction without residual deficits: Secondary | ICD-10-CM | POA: Diagnosis not present

## 2020-05-23 DIAGNOSIS — N528 Other male erectile dysfunction: Secondary | ICD-10-CM | POA: Diagnosis not present

## 2020-05-23 DIAGNOSIS — R3915 Urgency of urination: Secondary | ICD-10-CM | POA: Diagnosis not present

## 2020-06-05 DIAGNOSIS — R3915 Urgency of urination: Secondary | ICD-10-CM | POA: Diagnosis not present

## 2020-06-18 ENCOUNTER — Other Ambulatory Visit: Payer: Self-pay | Admitting: *Deleted

## 2020-06-18 DIAGNOSIS — I6339 Cerebral infarction due to thrombosis of other cerebral artery: Secondary | ICD-10-CM

## 2020-06-18 MED ORDER — CLOPIDOGREL BISULFATE 75 MG PO TABS: 75.0000 mg | ORAL_TABLET | Freq: Every day | ORAL | 1 refills | Status: DC

## 2020-06-20 NOTE — Telephone Encounter (Signed)
Error

## 2020-07-12 DIAGNOSIS — R3915 Urgency of urination: Secondary | ICD-10-CM | POA: Diagnosis not present

## 2020-08-15 ENCOUNTER — Other Ambulatory Visit: Payer: Self-pay | Admitting: Physician Assistant

## 2020-08-15 DIAGNOSIS — I6339 Cerebral infarction due to thrombosis of other cerebral artery: Secondary | ICD-10-CM

## 2020-08-15 DIAGNOSIS — I10 Essential (primary) hypertension: Secondary | ICD-10-CM

## 2020-08-19 DIAGNOSIS — R3915 Urgency of urination: Secondary | ICD-10-CM | POA: Diagnosis not present

## 2020-08-22 ENCOUNTER — Telehealth: Payer: Self-pay | Admitting: Neurology

## 2020-08-22 DIAGNOSIS — J302 Other seasonal allergic rhinitis: Secondary | ICD-10-CM

## 2020-08-22 DIAGNOSIS — N400 Enlarged prostate without lower urinary tract symptoms: Secondary | ICD-10-CM

## 2020-08-22 NOTE — Telephone Encounter (Signed)
Prior Authorization for Livalo submitted via covermymeds. Awaiting response. Your information has been submitted to [place] [employer]. [employer] will review the request and will issue a decision, typically within 1-3 days from your submission. You can check the updated outcome later by reopening this request.  If [employer] has not responded in 1-3 days or if you have any questions about your ePA request, please contact [place] [employer] Part D at [phone]. If you think there may be a problem with your PA request, use our live chat feature at the bottom right.

## 2020-08-27 MED ORDER — LEVOCETIRIZINE DIHYDROCHLORIDE 5 MG PO TABS: 5.0000 mg | ORAL_TABLET | Freq: Every day | ORAL | 0 refills | Status: DC

## 2020-08-27 MED ORDER — DOXAZOSIN MESYLATE 4 MG PO TABS: 4.0000 mg | ORAL_TABLET | Freq: Two times a day (BID) | ORAL | 0 refills | Status: DC

## 2020-08-27 NOTE — Addendum Note (Signed)
Addended byAnnamaria Helling on: 08/27/2020 03:56 PM   Modules accepted: Orders

## 2020-08-27 NOTE — Telephone Encounter (Signed)
Approval received valid 06/15/2020-06/14/2021.

## 2020-09-05 ENCOUNTER — Other Ambulatory Visit: Payer: Self-pay | Admitting: Neurology

## 2020-09-09 ENCOUNTER — Other Ambulatory Visit: Payer: Self-pay | Admitting: Neurology

## 2020-09-09 DIAGNOSIS — J302 Other seasonal allergic rhinitis: Secondary | ICD-10-CM

## 2020-09-09 DIAGNOSIS — N400 Enlarged prostate without lower urinary tract symptoms: Secondary | ICD-10-CM

## 2020-09-09 MED ORDER — DOXAZOSIN MESYLATE 4 MG PO TABS: 4.0000 mg | ORAL_TABLET | Freq: Two times a day (BID) | ORAL | 0 refills | Status: DC

## 2020-09-09 MED ORDER — LEVOCETIRIZINE DIHYDROCHLORIDE 5 MG PO TABS: 5.0000 mg | ORAL_TABLET | Freq: Every day | ORAL | 0 refills | Status: DC

## 2020-09-20 ENCOUNTER — Other Ambulatory Visit: Payer: Self-pay | Admitting: Physician Assistant

## 2020-09-20 DIAGNOSIS — F419 Anxiety disorder, unspecified: Secondary | ICD-10-CM

## 2020-11-05 ENCOUNTER — Other Ambulatory Visit: Payer: Self-pay | Admitting: Physician Assistant

## 2020-11-05 DIAGNOSIS — F419 Anxiety disorder, unspecified: Secondary | ICD-10-CM

## 2020-11-05 NOTE — Telephone Encounter (Signed)
Last written 09/23/2020 #30 no refills Last appt 05/03/2020 Scheduled 11/13/2020

## 2020-11-12 ENCOUNTER — Other Ambulatory Visit: Payer: Self-pay | Admitting: Physician Assistant

## 2020-11-13 ENCOUNTER — Other Ambulatory Visit: Payer: Self-pay

## 2020-11-13 ENCOUNTER — Telehealth: Payer: Self-pay | Admitting: Neurology

## 2020-11-13 ENCOUNTER — Ambulatory Visit (INDEPENDENT_AMBULATORY_CARE_PROVIDER_SITE_OTHER): Payer: Medicare HMO | Admitting: Physician Assistant

## 2020-11-13 VITALS — BP 145/56 | HR 61 | Ht 63.0 in | Wt 165.0 lb

## 2020-11-13 DIAGNOSIS — R600 Localized edema: Secondary | ICD-10-CM

## 2020-11-13 DIAGNOSIS — Z8673 Personal history of transient ischemic attack (TIA), and cerebral infarction without residual deficits: Secondary | ICD-10-CM | POA: Diagnosis not present

## 2020-11-13 DIAGNOSIS — G479 Sleep disorder, unspecified: Secondary | ICD-10-CM | POA: Diagnosis not present

## 2020-11-13 DIAGNOSIS — I739 Peripheral vascular disease, unspecified: Secondary | ICD-10-CM

## 2020-11-13 DIAGNOSIS — N400 Enlarged prostate without lower urinary tract symptoms: Secondary | ICD-10-CM

## 2020-11-13 DIAGNOSIS — Z1211 Encounter for screening for malignant neoplasm of colon: Secondary | ICD-10-CM

## 2020-11-13 DIAGNOSIS — N3000 Acute cystitis without hematuria: Secondary | ICD-10-CM

## 2020-11-13 DIAGNOSIS — I10 Essential (primary) hypertension: Secondary | ICD-10-CM

## 2020-11-13 DIAGNOSIS — R310 Gross hematuria: Secondary | ICD-10-CM

## 2020-11-13 DIAGNOSIS — I6339 Cerebral infarction due to thrombosis of other cerebral artery: Secondary | ICD-10-CM

## 2020-11-13 DIAGNOSIS — E782 Mixed hyperlipidemia: Secondary | ICD-10-CM

## 2020-11-13 DIAGNOSIS — J302 Other seasonal allergic rhinitis: Secondary | ICD-10-CM | POA: Diagnosis not present

## 2020-11-13 LAB — POCT URINALYSIS DIP (CLINITEK)
Blood, UA: NEGATIVE
Glucose, UA: NEGATIVE mg/dL
Nitrite, UA: NEGATIVE
POC PROTEIN,UA: 100 — AB
Spec Grav, UA: 1.025 (ref 1.010–1.025)
Urobilinogen, UA: 1 E.U./dL
pH, UA: 6 (ref 5.0–8.0)

## 2020-11-13 MED ORDER — DOXAZOSIN MESYLATE 4 MG PO TABS: 4.0000 mg | ORAL_TABLET | Freq: Two times a day (BID) | ORAL | 0 refills | Status: DC

## 2020-11-13 MED ORDER — HYDRALAZINE HCL 25 MG PO TABS: 25.0000 mg | ORAL_TABLET | Freq: Three times a day (TID) | ORAL | 1 refills | Status: DC

## 2020-11-13 MED ORDER — CIPROFLOXACIN HCL 500 MG PO TABS: 500.0000 mg | ORAL_TABLET | Freq: Two times a day (BID) | ORAL | 0 refills | Status: AC

## 2020-11-13 MED ORDER — AMLODIPINE BESYLATE 10 MG PO TABS
10.0000 mg | ORAL_TABLET | Freq: Every day | ORAL | 1 refills | Status: DC
Start: 2020-11-13 — End: 2021-09-23

## 2020-11-13 MED ORDER — ZYPITAMAG 1 MG PO TABS: 1.0000 mg | ORAL_TABLET | Freq: Every day | ORAL | 3 refills | Status: DC

## 2020-11-13 MED ORDER — LOSARTAN POTASSIUM 50 MG PO TABS: 1.0000 | ORAL_TABLET | Freq: Every day | ORAL | 1 refills | Status: DC

## 2020-11-13 MED ORDER — MONTELUKAST SODIUM 10 MG PO TABS: 10.0000 mg | ORAL_TABLET | Freq: Every day | ORAL | 1 refills | Status: DC

## 2020-11-13 MED ORDER — FUROSEMIDE 20 MG PO TABS: 20.0000 mg | ORAL_TABLET | Freq: Every day | ORAL | 1 refills | Status: DC

## 2020-11-13 MED ORDER — LEVOCETIRIZINE DIHYDROCHLORIDE 5 MG PO TABS: 5.0000 mg | ORAL_TABLET | Freq: Every day | ORAL | 3 refills | Status: DC

## 2020-11-13 MED ORDER — TRAZODONE HCL 100 MG PO TABS: ORAL_TABLET | ORAL | 1 refills | Status: DC

## 2020-11-13 NOTE — Telephone Encounter (Signed)
Cologuard order faxed to [phone] with confirmation received. They will contact the patient directly.   

## 2020-11-13 NOTE — Progress Notes (Signed)
Subjective:    Patient ID: Travis Barajas, male    DOB: 1950-11-05, 70 y.o.   MRN: [id number]  HPI Pt is a 70 yo male with HTN, hx of stroke, COPD, HLD, PVD, GERD who presents to the clinic for 3 month follow up.   Pt is doing ok except for some blood in urine and increased urination. Has BPH but this feels different. No fever, chills, nausea, vomiting or flank pain. Not tried anything to make better. Symptoms for the past week.   COPD-doing well. No concerns or complaints. Using incruse and BREO.  Hx of CVA- strength continues to get better. BP seems to be doing fine on losaartan, hydralazine, lasix, metoprol, norvasc. On livalo for cholesterol.   Having more cough and allergy symptoms. Taking xyzal daily and flonase.   .. Active Ambulatory Problems    Diagnosis Date Noted   GERD (gastroesophageal reflux disease) 07/23/2011   Allergic rhinitis 07/23/2011   Joint pain 07/23/2011   HTN (hypertension), malignant 10/01/2011   Tobacco user 12/05/2011   SOB (shortness of breath) 10/18/2012   Hematuria 11/07/2013   Hyperlipidemia 11/17/2013   COPD, moderate ([place]) 03/21/2014   Erectile dysfunction 03/21/2014   Claudication ([place]) 09/26/2014   Generalized anxiety disorder 01/15/2015   BPH without urinary obstruction 05/25/2016   Acute nonseasonal allergic rhinitis due to pollen 05/04/2017   Dyslipidemia 05/04/2017   Current smoker 09/01/2017   Hemoptysis 09/01/2017   Left carotid bruit 78/29/5621   Systolic murmur 30/86/5784   Trouble in sleeping 10/05/2018   PVD (peripheral vascular disease) with claudication ([place]) 02/22/2019   Thrombocytopenia ([place]) 02/24/2019   Slurred speech 02/24/2019   Dizziness 02/24/2019   Dysphagia 02/24/2019   TIA (transient ischemic attack) 02/24/2019   Intercostal retractions 03/31/2019   Cerebrovascular accident ([place]) 04/03/2019   Distended abdomen 04/14/2019   Primary hypertension 04/14/2019   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[place]) 04/26/2019    Renal cyst, left 04/26/2019   Gallstone 04/26/2019   Lower extremity edema 06/15/2019   Anxiety 06/15/2019   Wheezing 07/24/2019   COPD with acute exacerbation ([place]) 07/24/2019   Left thigh pain 07/24/2019   Acute left-sided low back pain with left-sided sciatica 07/24/2019   Pulmonary nodules 07/31/2019   Change in voice 09/01/2019   Expiratory stridor 09/01/2019   Choking 09/01/2019   Abnormal CT scan, neck 09/01/2019   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     Objective:   Physical Exam Vitals reviewed.  Constitutional:      Appearance: Normal appearance.  HENT:     Head: Normocephalic.  Neck:     Vascular: No carotid bruit.  Cardiovascular:     Rate and Rhythm: Normal rate and regular rhythm.     Pulses: Normal pulses.     Heart sounds: Murmur heard.  Pulmonary:     Effort: Pulmonary effort is normal.     Breath sounds: Normal breath sounds. No wheezing or rhonchi.  Abdominal:  General: Bowel sounds are normal. There is no distension.     Palpations: Abdomen is soft. There is no mass.     Tenderness: There is no abdominal tenderness. There is no right CVA tenderness, left CVA tenderness, guarding or rebound.  Musculoskeletal:        General: Normal range of motion.     Right lower leg: No edema.     Left lower leg: No edema.     Comments: Upper and lower ext strength 4/5 to 5/5 from left to right.   Neurological:     General: No focal deficit present.     Mental  Status: He is alert and oriented to person, place, and time.  Psychiatric:        Mood and Affect: Mood normal.    .. Results for orders placed or performed in visit on 11/13/20  Urine Culture   Specimen: Urine  Result Value Ref Range   MICRO NUMBER: [id number]    SPECIMEN QUALITY: Adequate    Sample Source NOT GIVEN    STATUS: FINAL    ISOLATE 1: Escherichia coli (A)       Susceptibility   Escherichia coli - URINE CULTURE, REFLEX    AMOX/CLAVULANIC 8 Sensitive     AMPICILLIN >=32 Resistant     AMPICILLIN/SULBACTAM 16 Intermediate     CEFAZOLIN* <=4 Not Reportable      * For infections other than uncomplicated UTIcaused by E. coli, K. pneumoniae or P. mirabilis:Cefazolin is resistant if MIC > or = 8 mcg/mL.(Distinguishing susceptible versus intermediatefor isolates with MIC < or = 4 mcg/mL requiresadditional testing.)For uncomplicated UTI caused by E. coli,K. pneumoniae or P. mirabilis: Cefazolin issusceptible if MIC <32 mcg/mL and predictssusceptible to the oral agents cefaclor, cefdinir,cefpodoxime, cefprozil, cefuroxime, cephalexinand loracarbef.    CEFEPIME <=1 Sensitive     CEFTRIAXONE <=1 Sensitive     CIPROFLOXACIN <=0.25 Sensitive     LEVOFLOXACIN <=0.12 Sensitive     ERTAPENEM <=0.5 Sensitive     GENTAMICIN <=1 Sensitive     IMIPENEM <=0.25 Sensitive     NITROFURANTOIN <=16 Sensitive     PIP/TAZO <=4 Sensitive     TOBRAMYCIN <=1 Sensitive     TRIMETH/SULFA* <=20 Sensitive      * For infections other than uncomplicated UTIcaused by E. coli, K. pneumoniae or P. mirabilis:Cefazolin is resistant if MIC > or = 8 mcg/mL.(Distinguishing susceptible versus intermediatefor isolates with MIC < or = 4 mcg/mL requiresadditional testing.)For uncomplicated UTI caused by E. coli,K. pneumoniae or P. mirabilis: Cefazolin issusceptible if MIC <32 mcg/mL and predictssusceptible to the oral agents cefaclor, cefdinir,cefpodoxime, cefprozil, cefuroxime, cephalexinand loracarbef.Legend:S =  Susceptible  I = IntermediateR = Resistant  NS = Not susceptible* = Not tested  NR = Not reported**NN = See antimicrobic comments  POCT URINALYSIS DIP (CLINITEK)  Result Value Ref Range   Color, UA yellow yellow   Clarity, UA clear clear   Glucose, UA negative negative mg/dL   Bilirubin, UA small (A) negative   Ketones, POC UA small (15) (A) negative mg/dL   Spec Grav, UA 1.025 1.010 - 1.025   Blood, UA negative negative   pH, UA 6.0 5.0 - 8.0   POC PROTEIN,UA =100 (A) negative, trace   Urobilinogen, UA 1.0 0.2 or 1.0 E.U./dL   Nitrite, UA Negative Negative   Leukocytes, UA Large (3+) (A) Negative         Assessment & Plan:  Marland KitchenMarland KitchenAaidyn San was seen today for follow-up.  Diagnoses and all  orders for this visit:  Primary hypertension -     COMPLETE METABOLIC PANEL WITH GFR  Gross hematuria -     POCT URINALYSIS DIP (CLINITEK) -     Urine Culture -     ciprofloxacin (CIPRO) 500 MG tablet; Take 1 tablet (500 mg total) by mouth 2 (two) times daily for 7 days.  Colon cancer screening -     Cologuard  Trouble in sleeping -     traZODone (DESYREL) 100 MG tablet; Take one and one half tablet at bedtime.  Uncontrolled hypertension -     losartan (COZAAR) 50 MG tablet; Take 1 tablet (50 mg total) by mouth daily. -     hydrALAZINE (APRESOLINE) 25 MG tablet; Take 1 tablet (25 mg total) by mouth 3 (three) times daily. -     amLODipine (NORVASC) 10 MG tablet; Take 1 tablet (10 mg total) by mouth daily.  History of stroke -     losartan (COZAAR) 50 MG tablet; Take 1 tablet (50 mg total) by mouth daily. -     hydrALAZINE (APRESOLINE) 25 MG tablet; Take 1 tablet (25 mg total) by mouth 3 (three) times daily.  Lower extremity edema -     furosemide (LASIX) 20 MG tablet; Take 1 tablet (20 mg total) by mouth daily.  BPH without urinary obstruction -     doxazosin (CARDURA) 4 MG tablet; Take 1 tablet (4 mg total) by mouth 2 (two) times daily.  Seasonal allergies -     montelukast  (SINGULAIR) 10 MG tablet; Take 1 tablet (10 mg total) by mouth at bedtime. -     levocetirizine (XYZAL) 5 MG tablet; Take 1 tablet (5 mg total) by mouth daily.  Acute cystitis without hematuria -     ciprofloxacin (CIPRO) 500 MG tablet; Take 1 tablet (500 mg total) by mouth 2 (two) times daily for 7 days.  PVD (peripheral vascular disease) with claudication (HCC) -     Pitavastatin Magnesium (ZYPITAMAG) 1 MG TABS; Take 1 mg by mouth daily.  Mixed hyperlipidemia -     Pitavastatin Magnesium (ZYPITAMAG) 1 MG TABS; Take 1 mg by mouth daily.  UA shows blood, lueks, protein.  Will culture.  Treated with cipro.  Increase hydration.  Follow up if urinary symptoms do not resolve.   Labs ordered and medications refilled.   Added singular for allergies. Continue inhalers for COPD.   BP close to goal but would like to see under 130/80. Start checking at home.

## 2020-11-15 LAB — URINE CULTURE
MICRO NUMBER:: 11959804
SPECIMEN QUALITY:: ADEQUATE

## 2020-11-15 NOTE — Progress Notes (Signed)
E.coli found in urine. Cipro given at appt should treat.

## 2020-11-19 ENCOUNTER — Telehealth: Payer: Self-pay | Admitting: Neurology

## 2020-11-19 DIAGNOSIS — N529 Male erectile dysfunction, unspecified: Secondary | ICD-10-CM | POA: Diagnosis not present

## 2020-11-19 DIAGNOSIS — Z960 Presence of urogenital implants: Secondary | ICD-10-CM | POA: Diagnosis not present

## 2020-11-19 NOTE — Telephone Encounter (Signed)
Patient's wife called stating with Pitavastin being written for half the dose they have to pay the same amount of money as they did the 2 mg. They want to know if we can write for 2 mg and they just split in half. Please advise. [phone].

## 2020-11-20 NOTE — Telephone Encounter (Signed)
I just hit refill on what was ordered ok to do higher dose. They are pretty small tablets make sure they can split them.

## 2020-11-20 NOTE — Telephone Encounter (Signed)
LMOM letting patient's wife know that the last RX he was on was the 1 mg. The last time he was on 2 mg was the beginning of 2021. She is to call back if we need to change anything, but he should just be taking 1 tablet daily.

## 2020-11-29 ENCOUNTER — Encounter: Payer: Self-pay | Admitting: Physician Assistant

## 2020-11-29 DIAGNOSIS — J302 Other seasonal allergic rhinitis: Secondary | ICD-10-CM | POA: Insufficient documentation

## 2020-11-29 DIAGNOSIS — H52223 Regular astigmatism, bilateral: Secondary | ICD-10-CM | POA: Diagnosis not present

## 2020-12-02 DIAGNOSIS — Z0100 Encounter for examination of eyes and vision without abnormal findings: Secondary | ICD-10-CM | POA: Diagnosis not present

## 2020-12-11 ENCOUNTER — Other Ambulatory Visit: Payer: Self-pay | Admitting: Physician Assistant

## 2020-12-11 DIAGNOSIS — F419 Anxiety disorder, unspecified: Secondary | ICD-10-CM

## 2020-12-12 NOTE — Telephone Encounter (Signed)
Last written 11/05/2020 #30 no refills Last appt 11/13/2020

## 2020-12-17 DIAGNOSIS — R3915 Urgency of urination: Secondary | ICD-10-CM | POA: Diagnosis not present

## 2021-01-13 DIAGNOSIS — R3915 Urgency of urination: Secondary | ICD-10-CM | POA: Diagnosis not present

## 2021-01-22 ENCOUNTER — Other Ambulatory Visit: Payer: Self-pay | Admitting: Physician Assistant

## 2021-01-22 DIAGNOSIS — F419 Anxiety disorder, unspecified: Secondary | ICD-10-CM

## 2021-01-22 DIAGNOSIS — G479 Sleep disorder, unspecified: Secondary | ICD-10-CM

## 2021-01-22 DIAGNOSIS — I6339 Cerebral infarction due to thrombosis of other cerebral artery: Secondary | ICD-10-CM

## 2021-01-23 NOTE — Telephone Encounter (Signed)
Last written 12/13/2020 #30 no refills  Last appt 11/13/2020

## 2021-02-20 ENCOUNTER — Other Ambulatory Visit: Payer: Self-pay | Admitting: Physician Assistant

## 2021-02-20 DIAGNOSIS — G479 Sleep disorder, unspecified: Secondary | ICD-10-CM

## 2021-02-25 ENCOUNTER — Other Ambulatory Visit: Payer: Self-pay | Admitting: Physician Assistant

## 2021-02-25 DIAGNOSIS — G479 Sleep disorder, unspecified: Secondary | ICD-10-CM

## 2021-03-12 DIAGNOSIS — R3915 Urgency of urination: Secondary | ICD-10-CM | POA: Diagnosis not present

## 2021-03-13 ENCOUNTER — Other Ambulatory Visit: Payer: Self-pay | Admitting: Medical-Surgical

## 2021-03-13 DIAGNOSIS — F419 Anxiety disorder, unspecified: Secondary | ICD-10-CM

## 2021-03-14 NOTE — Telephone Encounter (Signed)
Travis Barajas pt

## 2021-03-25 DIAGNOSIS — Z1211 Encounter for screening for malignant neoplasm of colon: Secondary | ICD-10-CM | POA: Diagnosis not present

## 2021-03-25 LAB — COLOGUARD: Cologuard: NEGATIVE

## 2021-03-27 ENCOUNTER — Telehealth: Payer: Self-pay | Admitting: *Deleted

## 2021-03-27 NOTE — Chronic Care Management (AMB) (Signed)
  Chronic Care Management   Outreach Note  03/27/2021 Name: Travis Barajas MRN: [id number] DOB: [birth year]  Travis Barajas is a 70 y.o. year old male who is a primary care patient of Donella Stade, [place]. I reached out to Travis Barajas by phone today in response to a referral sent by Travis Barajas's primary care provider.  An unsuccessful telephone outreach was attempted today. The patient was referred to the case management team for assistance with care management and care coordination.   Follow Up Plan: A HIPAA compliant phone message was left for the patient providing contact information and requesting a return call.  If patient returns call to provider office, please advise to call Embedded Care Management Care Guide Bonniejean Piano at [place], [place] [facility]  Direct Dial: [phone]

## 2021-04-03 NOTE — Chronic Care Management (AMB) (Signed)
  Chronic Care Management   Outreach Note  04/03/2021 Name: Travis Barajas MRN: [id number] DOB: 1951/04/18  Travis Barajas is a 70 y.o. year old male who is a primary care patient of Donella Stade, [place]. I reached out to [name] by phone today in response to a referral sent by Travis Barajas's primary care provider.  A second unsuccessful telephone outreach was attempted today. The patient was referred to the case management team for assistance with care management and care coordination.   Follow Up Plan: A HIPAA compliant phone message was left for the patient providing contact information and requesting a return call.  If patient returns call to provider office, please advise to call Embedded Care Management Care Guide Rathana Viveros at [place], [place] [facility]  Direct Dial: [phone]

## 2021-04-07 ENCOUNTER — Ambulatory Visit (INDEPENDENT_AMBULATORY_CARE_PROVIDER_SITE_OTHER): Payer: Medicare HMO | Admitting: Physician Assistant

## 2021-04-07 ENCOUNTER — Other Ambulatory Visit: Payer: Self-pay

## 2021-04-07 VITALS — BP 145/56 | HR 83 | Temp 98.8°F | Ht 63.0 in | Wt 163.0 lb

## 2021-04-07 DIAGNOSIS — R339 Retention of urine, unspecified: Secondary | ICD-10-CM

## 2021-04-07 DIAGNOSIS — R3915 Urgency of urination: Secondary | ICD-10-CM

## 2021-04-07 DIAGNOSIS — N3001 Acute cystitis with hematuria: Secondary | ICD-10-CM

## 2021-04-07 DIAGNOSIS — R350 Frequency of micturition: Secondary | ICD-10-CM

## 2021-04-07 DIAGNOSIS — N400 Enlarged prostate without lower urinary tract symptoms: Secondary | ICD-10-CM | POA: Diagnosis not present

## 2021-04-07 DIAGNOSIS — N401 Enlarged prostate with lower urinary tract symptoms: Secondary | ICD-10-CM | POA: Diagnosis not present

## 2021-04-07 DIAGNOSIS — G479 Sleep disorder, unspecified: Secondary | ICD-10-CM

## 2021-04-07 DIAGNOSIS — N342 Other urethritis: Secondary | ICD-10-CM

## 2021-04-07 LAB — POCT URINALYSIS DIP (CLINITEK)
Bilirubin, UA: NEGATIVE
Glucose, UA: NEGATIVE mg/dL
Nitrite, UA: NEGATIVE
POC PROTEIN,UA: 100 — AB
Spec Grav, UA: 1.025 (ref 1.010–1.025)
Urobilinogen, UA: 0.2 E.U./dL
pH, UA: 5.5 (ref 5.0–8.0)

## 2021-04-07 MED ORDER — CIPROFLOXACIN HCL 500 MG PO TABS: 500.0000 mg | ORAL_TABLET | Freq: Two times a day (BID) | ORAL | 0 refills | Status: DC

## 2021-04-07 MED ORDER — TRAZODONE HCL 100 MG PO TABS: 200.0000 mg | ORAL_TABLET | Freq: Every day | ORAL | 1 refills | Status: DC

## 2021-04-07 NOTE — Patient Instructions (Addendum)
Increase trazodone to 200mg  at bedtime.   Urinary Tract Infection, Adult A urinary tract infection (UTI) is an infection of any part of the urinary tract. The urinary tract includes: The kidneys. The ureters. The bladder. The urethra. These organs make, store, and get rid of pee (urine) in the body. What are the causes? This infection is caused by germs (bacteria) in your genital area. These germs grow and cause swelling (inflammation) of your urinary tract. What increases the risk? The following factors may make you more likely to develop this condition: Using a small, thin tube (catheter) to drain pee. Not being able to control when you pee or poop (incontinence). Being male. If you are male, these things can increase the risk: Using these methods to prevent pregnancy: A medicine that kills sperm (spermicide). A device that blocks sperm (diaphragm). Having low levels of a male hormone (estrogen). Being pregnant. You are more likely to develop this condition if: You have genes that add to your risk. You are sexually active. You take antibiotic medicines. You have trouble peeing because of: A prostate that is bigger than normal, if you are male. A blockage in the part of your body that drains pee from the bladder. A kidney stone. A nerve condition that affects your bladder. Not getting enough to drink. Not peeing often enough. You have other conditions, such as: Diabetes. A weak disease-fighting system (immune system). Sickle cell disease. Gout. Injury of the spine. What are the signs or symptoms? Symptoms of this condition include: Needing to pee right away. Peeing small amounts often. Pain or burning when peeing. Blood in the pee. Pee that smells bad or not like normal. Trouble peeing. Pee that is cloudy. Fluid coming from the vagina, if you are male. Pain in the belly or lower back. Other symptoms include: Vomiting. Not feeling hungry. Feeling mixed up  (confused). This may be the first symptom in older adults. Being tired and grouchy (irritable). A fever. Watery poop (diarrhea). How is this treated? Taking antibiotic medicine. Taking other medicines. Drinking enough water. In some cases, you may need to see a specialist. Follow these instructions at home: Medicines Take over-the-counter and prescription medicines only as told by your doctor. If you were prescribed an antibiotic medicine, take it as told by your doctor. Do not stop taking it even if you start to feel better. General instructions Make sure you: Pee until your bladder is empty. Do not hold pee for a long time. Empty your bladder after sex. Wipe from front to back after peeing or pooping if you are a male. Use each tissue one time when you wipe. Drink enough fluid to keep your pee pale yellow. Keep all follow-up visits. Contact a doctor if: You do not get better after 1-2 days. Your symptoms go away and then come back. Get help right away if: You have very bad back pain. You have very bad pain in your lower belly. You have a fever. You have chills. You feeling like you will vomit or you vomit. Summary A urinary tract infection (UTI) is an infection of any part of the urinary tract. This condition is caused by germs in your genital area. There are many risk factors for a UTI. Treatment includes antibiotic medicines. Drink enough fluid to keep your pee pale yellow. This information is not intended to replace advice given to you by your health care provider. Make sure you discuss any questions you have with your health care provider. Document Revised: 01/12/2020  Document Reviewed: 01/12/2020 Elsevier Patient Education  [place].

## 2021-04-07 NOTE — Progress Notes (Signed)
L 

## 2021-04-07 NOTE — Progress Notes (Signed)
Subjective:    Patient ID: Travis Barajas, male    DOB: January 11, 1951, 70 y.o.   MRN: [id number]  HPI Pt is a 70 yo male with BPH and ED who presents to the clinic with worsening urinary symptoms since Friday. For 4 days he has had the urge to go frequently but only urinating a little at a time. There is a little discomfort but no pain. No abdominal or flank pain. No fever, chills, nausea or vomiting.   Continues to have some problems going to sleep. Trazodone 165m helps most nights but wonders if he can increase.  .. Active Ambulatory Problems    Diagnosis Date Noted   GERD (gastroesophageal reflux disease) 07/23/2011   Allergic rhinitis 07/23/2011   Joint pain 07/23/2011   HTN (hypertension), malignant 10/01/2011   Tobacco user 12/05/2011   SOB (shortness of breath) 10/18/2012   Hematuria 11/07/2013   Hyperlipidemia 11/17/2013   COPD, moderate ([facility] 03/21/2014   Erectile dysfunction 03/21/2014   Claudication ([facility] 09/26/2014   Generalized anxiety disorder 01/15/2015   BPH without urinary obstruction 05/25/2016   Acute nonseasonal allergic rhinitis due to pollen 05/04/2017   Dyslipidemia 05/04/2017   Current smoker 09/01/2017   Hemoptysis 09/01/2017   Left carotid bruit 062/69/4854  Systolic murmur 062/70/3500  Trouble in sleeping 10/05/2018   PVD (peripheral vascular disease) with claudication ([facility] 02/22/2019   Thrombocytopenia ([facility] 02/24/2019   Slurred speech 02/24/2019   Dizziness 02/24/2019   Dysphagia 02/24/2019   TIA (transient ischemic attack) 02/24/2019   Intercostal retractions 03/31/2019   Cerebrovascular accident ([facility] 04/03/2019   Distended abdomen 04/14/2019   Primary hypertension 04/14/2019   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[facility] 04/26/2019   Renal cyst, left 04/26/2019   Gallstone 04/26/2019   Lower extremity edema 06/15/2019   Anxiety 06/15/2019   Wheezing 07/24/2019   COPD with acute exacerbation ([facility] 07/24/2019   Left thigh pain 07/24/2019   Acute  left-sided low back pain with left-sided sciatica 07/24/2019   Pulmonary nodules 07/31/2019   Change in voice 09/01/2019   Expiratory stridor 09/01/2019   Choking 09/01/2019   Abnormal CT scan, neck 09/01/2019   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  Objective:   Physical Exam Vitals reviewed.  Constitutional:      Appearance: Normal appearance.  HENT:     Head: Normocephalic.  Cardiovascular:     Rate and Rhythm: Normal rate.  Pulmonary:     Effort: Pulmonary effort is normal.  Abdominal:     General: Bowel sounds are normal. There is no distension.     Palpations: Abdomen is soft. There is no mass.     Tenderness: There is no abdominal tenderness. There is no right CVA tenderness, left CVA tenderness, guarding or rebound.     Hernia: No hernia is present.  Neurological:     Mental Status: He is alert.  Psychiatric:  Mood and Affect: Mood normal.      .. Results for orders placed or performed in visit on 04/07/21  PSA  Result Value Ref Range   PSA 11.45 (H) < OR = 4.00 ng/mL  CBC  Result Value Ref Range   WBC 19.5 (H) 3.8 - 10.8 Thousand/uL   RBC 5.40 4.20 - 5.80 Million/uL   Hemoglobin 14.7 13.2 - 17.1 g/dL   HCT 44.4 38.5 - 50.0 %   MCV 82.2 80.0 - 100.0 fL   MCH 27.2 27.0 - 33.0 pg   MCHC 33.1 32.0 - 36.0 g/dL   RDW 13.1 11.0 - 15.0 %   Platelets 152 140 - 400 Thousand/uL   MPV  7.5 - 12.5 fL  COMPLETE METABOLIC PANEL WITH GFR   Result Value Ref Range   Glucose, Bld 96 65 - 99 mg/dL   BUN 14 7 - 25 mg/dL   Creat 0.94 0.70 - 1.35 mg/dL   eGFR 88 > OR = 60 mL/min/1.53m   BUN/Creatinine Ratio NOT APPLICABLE 6 - 22 (calc)   Sodium 138 135 - 146 mmol/L   Potassium 4.4 3.5 - 5.3 mmol/L   Chloride 102 98 - 110 mmol/L   CO2 27 20 - 32 mmol/L   Calcium 9.2 8.6 - 10.3 mg/dL   Total Protein 6.7 6.1 - 8.1 g/dL   Albumin 3.8 3.6 - 5.1 g/dL   Globulin 2.9 1.9 - 3.7 g/dL (calc)   AG Ratio 1.3 1.0 - 2.5 (calc)   Total Bilirubin 1.2 0.2 - 1.2 mg/dL   Alkaline phosphatase (APISO) 73 35 - 144 U/L   AST 20 10 - 35 U/L   ALT 8 (L) 9 - 46 U/L  POCT URINALYSIS DIP (CLINITEK)  Result Value Ref Range   Color, UA yellow yellow   Clarity, UA clear clear   Glucose, UA negative negative mg/dL   Bilirubin, UA negative negative   Ketones, POC UA small (15) (A) negative mg/dL   Spec Grav, UA 1.025 1.010 - 1.025   Blood, UA moderate (A) negative   pH, UA 5.5 5.0 - 8.0   POC PROTEIN,UA =100 (A) negative, trace   Urobilinogen, UA 0.2 0.2 or 1.0 E.U./dL   Nitrite, UA Negative Negative   Leukocytes, UA Small (1+) (A) Negative       Assessment & Plan:  .Marland KitchenMarland KitchenOreste Majeedwas seen today for urinary frequency.  Diagnoses and all orders for this visit:  Infective urethritis -     CBC -     ciprofloxacin (CIPRO) 500 MG tablet; Take 1 tablet (500 mg total) by mouth 2 (two) times daily for 10 days. -     POCT URINALYSIS DIP (CLINITEK) -     Urine Culture  Urinary retention -     PSA -     CBC -     COMPLETE METABOLIC PANEL WITH GFR -     POCT URINALYSIS DIP (CLINITEK) -     Urine Culture  Urinary urgency -     PSA -     CBC -     COMPLETE METABOLIC PANEL WITH GFR -     POCT URINALYSIS DIP (CLINITEK) -     Urine Culture  Benign prostatic hyperplasia with urinary frequency -     PSA -     CBC -     COMPLETE METABOLIC PANEL WITH GFR -     POCT URINALYSIS DIP (CLINITEK) -     Urine Culture  Trouble in sleeping -  traZODone (DESYREL) 100 MG tablet; Take 2 tablets (200 mg total) by mouth at bedtime.  UA dipstick showed blood, leuks, protein.  Treated with cipro for UTI. No complicated symptoms or signs.  Will send for culture.  Will get CBC, PsA, CMP.  Increase hydration.  Follow up as needed or if symptoms worsen.   Discussed sleep hygiene with patient and encouraged NO TV in bed.  Increased trazodone to 278m at bedtime.   Spent 30 minutes with patient discussing symptoms and treatment plan.

## 2021-04-08 ENCOUNTER — Encounter: Payer: Self-pay | Admitting: Physician Assistant

## 2021-04-08 ENCOUNTER — Other Ambulatory Visit: Payer: Self-pay

## 2021-04-08 DIAGNOSIS — D72828 Other elevated white blood cell count: Secondary | ICD-10-CM

## 2021-04-08 DIAGNOSIS — K6289 Other specified diseases of anus and rectum: Secondary | ICD-10-CM

## 2021-04-08 DIAGNOSIS — N401 Enlarged prostate with lower urinary tract symptoms: Secondary | ICD-10-CM | POA: Insufficient documentation

## 2021-04-08 LAB — COMPLETE METABOLIC PANEL WITH GFR
AG Ratio: 1.3 (calc) (ref 1.0–2.5)
ALT: 8 U/L — ABNORMAL LOW (ref 9–46)
AST: 20 U/L (ref 10–35)
Albumin: 3.8 g/dL (ref 3.6–5.1)
Alkaline phosphatase (APISO): 73 U/L (ref 35–144)
BUN: 14 mg/dL (ref 7–25)
CO2: 27 mmol/L (ref 20–32)
Calcium: 9.2 mg/dL (ref 8.6–10.3)
Chloride: 102 mmol/L (ref 98–110)
Creat: 0.94 mg/dL (ref 0.70–1.35)
Globulin: 2.9 g/dL (calc) (ref 1.9–3.7)
Glucose, Bld: 96 mg/dL (ref 65–99)
Potassium: 4.4 mmol/L (ref 3.5–5.3)
Sodium: 138 mmol/L (ref 135–146)
Total Bilirubin: 1.2 mg/dL (ref 0.2–1.2)
Total Protein: 6.7 g/dL (ref 6.1–8.1)
eGFR: 88 mL/min/{1.73_m2} (ref >=60)

## 2021-04-08 LAB — CBC
HCT: 44.4 % (ref 38.5–50.0)
Hemoglobin: 14.7 g/dL (ref 13.2–17.1)
MCH: 27.2 pg (ref 27.0–33.0)
MCHC: 33.1 g/dL (ref 32.0–36.0)
MCV: 82.2 fL (ref 80.0–100.0)
Platelets: 152 10*3/uL (ref 140–400)
RBC: 5.4 10*6/uL (ref 4.20–5.80)
RDW: 13.1 % (ref 11.0–15.0)
WBC: 19.5 10*3/uL — ABNORMAL HIGH (ref 3.8–10.8)

## 2021-04-08 LAB — PSA: PSA: 11.45 ng/mL — ABNORMAL HIGH (ref <=4.00)

## 2021-04-08 NOTE — Progress Notes (Signed)
WBC elevated which is a sign of infection. PSA is very high which in this case due to infection. You have prostatitis. Cipro is the agent of choice for that as well but you do need to continue that longer than given yesterday. I gave you 10 days but you need to take for 2 weeks. Please send over another 4 days with note to pharmacy to add to previous 10 days.   Follow up in 2 weeks for recheck CBC, PsA.

## 2021-04-09 LAB — URINE CULTURE
MICRO NUMBER:: 12543972
SPECIMEN QUALITY:: ADEQUATE

## 2021-04-09 NOTE — Progress Notes (Signed)
Urine culture showed klebsiella oxytoca and sensitive to cipro. Should be feeling better soon.

## 2021-04-10 NOTE — Chronic Care Management (AMB) (Signed)
  Chronic Care Management   Outreach Note  04/10/2021 Name: Travis Barajas MRN: [id number] DOB: 1951-04-30  Travis Barajas is a 70 y.o. year old male who is a primary care patient of Donella Stade, [place]. I reached out to Travis Barajas by phone today in response to a referral sent by Travis Barajas's primary care provider.  Third unsuccessful telephone outreach was attempted today. The patient was referred to the case management team for assistance with care management and care coordination. The patient's primary care provider has been notified of our unsuccessful attempts to make or maintain contact with the patient. The care management team is pleased to engage with this patient at any time in the future should he/she be interested in assistance from the care management team.   Follow Up Plan: We have been unable to make contact with the patient for follow up. The care management team is available to follow up with the patient after provider conversation with the patient regarding recommendation for care management engagement   Julian Hy, [place] [facility]  Direct Dial: [phone]

## 2021-04-11 ENCOUNTER — Other Ambulatory Visit: Payer: Self-pay | Admitting: Physician Assistant

## 2021-04-11 DIAGNOSIS — N342 Other urethritis: Secondary | ICD-10-CM

## 2021-04-15 ENCOUNTER — Telehealth: Payer: Self-pay | Admitting: Neurology

## 2021-04-15 DIAGNOSIS — N342 Other urethritis: Secondary | ICD-10-CM

## 2021-04-15 MED ORDER — CIPROFLOXACIN HCL 500 MG PO TABS: 500.0000 mg | ORAL_TABLET | Freq: Two times a day (BID) | ORAL | 0 refills | Status: AC

## 2021-04-16 NOTE — Telephone Encounter (Signed)
Called patient's wife to let her know extra 4 days sent to pharmacy. LMOM.

## 2021-04-21 ENCOUNTER — Other Ambulatory Visit: Payer: Self-pay | Admitting: Physician Assistant

## 2021-04-21 DIAGNOSIS — I10 Essential (primary) hypertension: Secondary | ICD-10-CM

## 2021-06-06 ENCOUNTER — Other Ambulatory Visit: Payer: Self-pay | Admitting: Physician Assistant

## 2021-06-06 DIAGNOSIS — I6339 Cerebral infarction due to thrombosis of other cerebral artery: Secondary | ICD-10-CM

## 2021-06-11 DIAGNOSIS — Z960 Presence of urogenital implants: Secondary | ICD-10-CM | POA: Diagnosis not present

## 2021-06-11 DIAGNOSIS — R972 Elevated prostate specific antigen [PSA]: Secondary | ICD-10-CM | POA: Diagnosis not present

## 2021-06-11 DIAGNOSIS — R3915 Urgency of urination: Secondary | ICD-10-CM | POA: Diagnosis not present

## 2021-06-12 ENCOUNTER — Other Ambulatory Visit: Payer: Self-pay | Admitting: Physician Assistant

## 2021-06-12 DIAGNOSIS — F419 Anxiety disorder, unspecified: Secondary | ICD-10-CM

## 2021-06-13 NOTE — Telephone Encounter (Signed)
Last written 03/14/21 #30 with 1 refill Last appt 04/07/21

## 2021-06-23 ENCOUNTER — Telehealth: Payer: Self-pay

## 2021-06-23 NOTE — Telephone Encounter (Signed)
Medication: Pitavastatin Magnesium (ZYPITAMAG) 1 MG TABS Prior authorization submitted via CoverMyMeds on 06/23/2021 [place] submission pending

## 2021-06-24 NOTE — Telephone Encounter (Signed)
Medication: Pitavastatin Magnesium (ZYPITAMAG) 1 MG TABS Prior authorization determination received Medication has been approved Approval dates: 06/15/2021-06/14/2022  Patient aware via: phone Pharmacy aware: Yes Provider aware via this encounter

## 2021-06-29 ENCOUNTER — Other Ambulatory Visit: Payer: Self-pay | Admitting: Physician Assistant

## 2021-06-29 DIAGNOSIS — J302 Other seasonal allergic rhinitis: Secondary | ICD-10-CM

## 2021-08-03 ENCOUNTER — Other Ambulatory Visit: Payer: Self-pay | Admitting: Physician Assistant

## 2021-08-03 DIAGNOSIS — R600 Localized edema: Secondary | ICD-10-CM

## 2021-08-03 DIAGNOSIS — F419 Anxiety disorder, unspecified: Secondary | ICD-10-CM

## 2021-08-04 NOTE — Telephone Encounter (Signed)
Last appt October 2022 Last written 06/13/2021 #30 no refills

## 2021-08-25 ENCOUNTER — Ambulatory Visit (INDEPENDENT_AMBULATORY_CARE_PROVIDER_SITE_OTHER): Payer: Medicare HMO

## 2021-08-25 ENCOUNTER — Ambulatory Visit (INDEPENDENT_AMBULATORY_CARE_PROVIDER_SITE_OTHER): Payer: Medicare HMO | Admitting: Physician Assistant

## 2021-08-25 ENCOUNTER — Other Ambulatory Visit: Payer: Self-pay

## 2021-08-25 ENCOUNTER — Encounter: Payer: Self-pay | Admitting: Physician Assistant

## 2021-08-25 VITALS — BP 158/84 | HR 78 | Ht 63.0 in | Wt 165.0 lb

## 2021-08-25 DIAGNOSIS — E782 Mixed hyperlipidemia: Secondary | ICD-10-CM

## 2021-08-25 DIAGNOSIS — R079 Chest pain, unspecified: Secondary | ICD-10-CM

## 2021-08-25 DIAGNOSIS — J449 Chronic obstructive pulmonary disease, unspecified: Secondary | ICD-10-CM | POA: Diagnosis not present

## 2021-08-25 DIAGNOSIS — Z125 Encounter for screening for malignant neoplasm of prostate: Secondary | ICD-10-CM

## 2021-08-25 DIAGNOSIS — Z8673 Personal history of transient ischemic attack (TIA), and cerebral infarction without residual deficits: Secondary | ICD-10-CM

## 2021-08-25 DIAGNOSIS — R69 Illness, unspecified: Secondary | ICD-10-CM | POA: Diagnosis not present

## 2021-08-25 DIAGNOSIS — Z79899 Other long term (current) drug therapy: Secondary | ICD-10-CM

## 2021-08-25 DIAGNOSIS — I10 Essential (primary) hypertension: Secondary | ICD-10-CM

## 2021-08-25 DIAGNOSIS — R0989 Other specified symptoms and signs involving the circulatory and respiratory systems: Secondary | ICD-10-CM

## 2021-08-25 DIAGNOSIS — I739 Peripheral vascular disease, unspecified: Secondary | ICD-10-CM | POA: Diagnosis not present

## 2021-08-25 DIAGNOSIS — R4586 Emotional lability: Secondary | ICD-10-CM

## 2021-08-25 MED ORDER — ESCITALOPRAM OXALATE 5 MG PO TABS: 5.0000 mg | ORAL_TABLET | Freq: Every day | ORAL | 2 refills | Status: DC

## 2021-08-25 MED ORDER — ZYPITAMAG 1 MG PO TABS: 1.0000 mg | ORAL_TABLET | Freq: Every day | ORAL | 3 refills | Status: DC

## 2021-08-25 MED ORDER — ALBUTEROL SULFATE HFA 108 (90 BASE) MCG/ACT IN AERS: 2.0000 | INHALATION_SPRAY | Freq: Four times a day (QID) | RESPIRATORY_TRACT | 1 refills | Status: DC | PRN

## 2021-08-25 NOTE — Progress Notes (Signed)
Subjective:    Patient ID: Travis Barajas, male    DOB: 1951/02/05, 71 y.o.   MRN: [id number]  HPI Pt is a 71 yo male with history of CVA, PVD, HTN, HLD, COPD who presents to the clinic with some concerns today.   He is having bilateral leg pain and cramping with any exertion. This has been since January. Pt has PVD and per wife declined stents in legs at one point. He has not been able to be very active lately. He is on statin but he states he has been out for the last 3 months at least.   Pt is also having intermittent right sided chest pain that radiates to his right back. No cough, congestion, fever, chills, shortness of breath. Pain is worse when he is exerting himself or outside. If he does not do anything he does not have any pain.   Wife states his mood is not good. He is very irritable. She thinks he needs something for his mood. No SI/HC.  Marland Kitchen. Active Ambulatory Problems    Diagnosis Date Noted   GERD (gastroesophageal reflux disease) 07/23/2011   Allergic rhinitis 07/23/2011   Joint pain 07/23/2011   HTN (hypertension), malignant 10/01/2011   Tobacco user 12/05/2011   SOB (shortness of breath) 10/18/2012   Hematuria 11/07/2013   Hyperlipidemia 11/17/2013   COPD, moderate (HCC) 03/21/2014   Erectile dysfunction 03/21/2014   Claudication (HCC) 09/26/2014   Generalized anxiety disorder 01/15/2015   BPH without urinary obstruction 05/25/2016   Acute nonseasonal allergic rhinitis due to pollen 05/04/2017   Dyslipidemia 05/04/2017   Current smoker 09/01/2017   Hemoptysis 09/01/2017   Left carotid bruit 09/01/2017   Systolic murmur 09/01/2017   Trouble in sleeping 10/05/2018   PVD (peripheral vascular disease) with claudication (HCC) 02/22/2019   Thrombocytopenia (HCC) 02/24/2019   Slurred speech 02/24/2019   Dizziness 02/24/2019   Dysphagia 02/24/2019   TIA (transient ischemic attack) 02/24/2019   Intercostal retractions 03/31/2019    Cerebrovascular accident (HCC) 04/03/2019   Distended abdomen 04/14/2019   Primary hypertension 04/14/2019   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HCC) 04/26/2019   Renal cyst, left 04/26/2019   Gallstone 04/26/2019   Lower extremity edema 06/15/2019   Anxiety 06/15/2019   Wheezing 07/24/2019   COPD with acute exacerbation (HCC) 07/24/2019   Left thigh pain 07/24/2019   Acute left-sided low back pain with left-sided sciatica 07/24/2019   Pulmonary nodules 07/31/2019   Change in voice 09/01/2019   Expiratory stridor 09/01/2019   Choking 09/01/2019   Abnormal CT scan, neck 09/01/2019   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   Bilateral hand swelling 01/05/2020   Plantar fasciitis 01/05/2020   Seasonal allergies 11/29/2020   Benign prostatic hyperplasia with urinary frequency 04/08/2021   Diminished pulses in lower extremity 08/26/2021   Resolved Ambulatory Problems    Diagnosis Date Noted   BPH (benign prostatic hyperplasia) 11/21/2013   Pneumonia of left lower lobe due to infectious organism 04/03/2019   Past Medical History:  Diagnosis Date   Allergy    Arthritis    COPD (chronic obstructive pulmonary disease) (HCC)    Hypertension    Stroke (HCC)     Review of Systems     Objective:   Physical Exam Vitals reviewed.  Constitutional:      Appearance: Normal appearance.  HENT:     Head: Normocephalic.  Neck:     Vascular: No carotid bruit.  Cardiovascular:     Rate and Rhythm: Normal rate and regular  rhythm.     Pulses: Normal pulses.     Heart sounds: Normal heart sounds.  Pulmonary:     Effort: Pulmonary effort is normal.     Breath sounds: Normal breath sounds. No wheezing or rhonchi.  Abdominal:     General: There is no distension.     Palpations: Abdomen is soft. There is no mass.     Tenderness: There is no abdominal tenderness. There is no right CVA tenderness, left CVA tenderness or guarding.     Hernia: No hernia is present.   Musculoskeletal:     Right lower leg: No edema.     Left lower leg: No edema.     Comments: No hair on legs.  Diminished pulses in both lower ext. Almost absent in right lower ext.  No ulcers   Lymphadenopathy:     Cervical: No cervical adenopathy.  Neurological:     General: No focal deficit present.     Mental Status: He is alert and oriented to person, place, and time.  Psychiatric:        Mood and Affect: Mood normal.          Assessment & Plan:  Marland KitchenMarland KitchenJondavid Barajas was seen today for follow-up.  Diagnoses and all orders for this visit:  PVD (peripheral vascular disease) with claudication (HCC) -     Lipid Panel w/reflex Direct LDL -     Pitavastatin Magnesium (ZYPITAMAG) 1 MG TABS; Take 1 mg by mouth daily. -     EKG 12-Lead -     Ambulatory referral to Vascular Surgery  COPD, moderate (HCC) -     albuterol (VENTOLIN HFA) 108 (90 Base) MCG/ACT inhaler; Inhale 2 puffs into the lungs every 6 (six) hours as needed.  Mixed hyperlipidemia -     Lipid Panel w/reflex Direct LDL -     Pitavastatin Magnesium (ZYPITAMAG) 1 MG TABS; Take 1 mg by mouth daily. -     Ambulatory referral to Vascular Surgery  Medication management -     COMPLETE METABOLIC PANEL WITH GFR  Prostate cancer screening -     PSA -     CBC with Differential/Platelet  Right-sided chest pain -     DG Chest 2 View; Future  Mood changes  Diminished pulses in lower extremity -     Ambulatory referral to Vascular Surgery  Claudication [facility]) -     Ambulatory referral to Vascular Surgery  Other orders -     escitalopram (LEXAPRO) 5 MG tablet; Take 1 tablet (5 mg total) by mouth daily.   Pulses are extremely diminished in bilateral lower extremity but almost absent in right lower extremity.  Hx of PVD and declined stents at one point.  On statin but has not taken for the last 3-4 months.  Restarted today.  On Plavix.  No ulcers or lesions today.  Will refer to vascular to continue work up.   EKG  unchanged from 2016. No ST elevation or depression.  Will get nuclear stress test to evaluate for any coronary ischemia.  Discussed mood.  Added lexapro daily.  Follow up in 4-6 weeks.    Declined all vaccines today. He is aware of risk.

## 2021-08-25 NOTE — Patient Instructions (Addendum)
Will get in with vascular ASAP.  ?Start back on livalo your cholesterol medication.  ?Started lexapro daily and use xanax just when needed ?Will order stress test, cardiology will call.  ?

## 2021-08-26 ENCOUNTER — Other Ambulatory Visit: Payer: Self-pay | Admitting: Physician Assistant

## 2021-08-26 DIAGNOSIS — R0989 Other specified symptoms and signs involving the circulatory and respiratory systems: Secondary | ICD-10-CM | POA: Insufficient documentation

## 2021-08-26 DIAGNOSIS — I10 Essential (primary) hypertension: Secondary | ICD-10-CM

## 2021-08-26 DIAGNOSIS — Z125 Encounter for screening for malignant neoplasm of prostate: Secondary | ICD-10-CM | POA: Diagnosis not present

## 2021-08-26 DIAGNOSIS — Z79899 Other long term (current) drug therapy: Secondary | ICD-10-CM | POA: Diagnosis not present

## 2021-08-26 DIAGNOSIS — E782 Mixed hyperlipidemia: Secondary | ICD-10-CM | POA: Diagnosis not present

## 2021-08-26 DIAGNOSIS — I739 Peripheral vascular disease, unspecified: Secondary | ICD-10-CM | POA: Diagnosis not present

## 2021-08-26 DIAGNOSIS — R7301 Impaired fasting glucose: Secondary | ICD-10-CM | POA: Diagnosis not present

## 2021-08-26 DIAGNOSIS — Z8673 Personal history of transient ischemic attack (TIA), and cerebral infarction without residual deficits: Secondary | ICD-10-CM

## 2021-08-26 MED ORDER — PITAVASTATIN CALCIUM 2 MG PO TABS: 2.0000 mg | ORAL_TABLET | Freq: Every day | ORAL | 3 refills | Status: DC

## 2021-08-26 NOTE — Progress Notes (Signed)
Normal cXR. Lungs clear.

## 2021-08-27 ENCOUNTER — Telehealth (HOSPITAL_COMMUNITY): Payer: Self-pay

## 2021-08-27 ENCOUNTER — Encounter: Payer: Self-pay | Admitting: Physician Assistant

## 2021-08-27 DIAGNOSIS — R7301 Impaired fasting glucose: Secondary | ICD-10-CM | POA: Insufficient documentation

## 2021-08-27 MED ORDER — ROSUVASTATIN CALCIUM 10 MG PO TABS: 10.0000 mg | ORAL_TABLET | Freq: Every day | ORAL | 3 refills | Status: DC

## 2021-08-27 NOTE — Addendum Note (Signed)
Addended by: Donella Stade on: 08/27/2021 04:30 PM ? ? Modules accepted: Orders ? ?

## 2021-08-27 NOTE — Progress Notes (Signed)
Your PSA is much better.  ?As suspected being off cholesterol medication your LDL is up. I sent livalo to pharmacy. We MUST get LDL way down to decrease CV risk and other cardiovascular events.  ?WBC and hemoglobin look good.  ?Kidney and liver look good.  ?Your glucose was pretty high. We need to add A1C to evaluate for diabetes.

## 2021-08-27 NOTE — Telephone Encounter (Signed)
Detailed instructions left on the patient's answering machine. Asked to call back with any questions. S.Jenny Lai EMTP 

## 2021-08-27 NOTE — Progress Notes (Signed)
Cindy, Can you look into vascular referral?  ? ?You did not tolerate lipitor but we can try crestor at the lowest dose and see if you tolerate and if you do slowly titrate up. Take at bedtime.

## 2021-08-28 ENCOUNTER — Ambulatory Visit (HOSPITAL_COMMUNITY): Payer: Medicare HMO | Attending: Internal Medicine

## 2021-08-28 DIAGNOSIS — I739 Peripheral vascular disease, unspecified: Secondary | ICD-10-CM

## 2021-08-28 DIAGNOSIS — I10 Essential (primary) hypertension: Secondary | ICD-10-CM

## 2021-08-28 DIAGNOSIS — E782 Mixed hyperlipidemia: Secondary | ICD-10-CM

## 2021-08-28 DIAGNOSIS — Z8673 Personal history of transient ischemic attack (TIA), and cerebral infarction without residual deficits: Secondary | ICD-10-CM

## 2021-08-28 DIAGNOSIS — R079 Chest pain, unspecified: Secondary | ICD-10-CM | POA: Diagnosis not present

## 2021-08-28 LAB — MYOCARDIAL PERFUSION IMAGING
LV dias vol: 94 mL (ref 62–150)
LV sys vol: 33 mL
Nuc Stress EF: 65 %
Peak HR: 94 {beats}/min
Rest HR: 69 {beats}/min
Rest Nuclear Isotope Dose: 10.1 mCi
SDS: 0
SRS: 0
SSS: 0
ST Depression (mm): 0 mm
Stress Nuclear Isotope Dose: 29.6 mCi
TID: 1.02

## 2021-08-28 MED ORDER — TECHNETIUM TC 99M TETROFOSMIN IV KIT
10.1000 | PACK | Freq: Once | INTRAVENOUS | Status: AC | PRN
  Administered 2021-08-28: 10.1 via INTRAVENOUS
  Filled 2021-08-28: qty 11

## 2021-08-28 MED ORDER — REGADENOSON 0.4 MG/5ML IV SOLN
0.4000 mg | Freq: Once | INTRAVENOUS | Status: AC
  Administered 2021-08-28: 0.4 mg via INTRAVENOUS

## 2021-08-28 MED ORDER — TECHNETIUM TC 99M TETROFOSMIN IV KIT
29.6000 | PACK | Freq: Once | INTRAVENOUS | Status: AC | PRN
  Administered 2021-08-28: 29.6 via INTRAVENOUS
  Filled 2021-08-28: qty 30

## 2021-08-29 NOTE — Progress Notes (Signed)
Stress test is very low risk for any ischemia or blockages. Great news.

## 2021-08-30 LAB — LIPID PANEL W/REFLEX DIRECT LDL
Cholesterol: 250 mg/dL — ABNORMAL HIGH (ref <200)
HDL: 44 mg/dL (ref >=40)
LDL Cholesterol (Calc): 176 mg/dL (calc) — ABNORMAL HIGH
Non-HDL Cholesterol (Calc): 206 mg/dL (calc) — ABNORMAL HIGH (ref <130)
Total CHOL/HDL Ratio: 5.7 (calc) — ABNORMAL HIGH (ref <5.0)
Triglycerides: 152 mg/dL — ABNORMAL HIGH (ref <150)

## 2021-08-30 LAB — CBC WITH DIFFERENTIAL/PLATELET
Absolute Monocytes: 640 cells/uL (ref 200–950)
Basophils Absolute: 64 cells/uL (ref 0–200)
Basophils Relative: 0.8 %
Eosinophils Absolute: 544 cells/uL — ABNORMAL HIGH (ref 15–500)
Eosinophils Relative: 6.8 %
HCT: 44.1 % (ref 38.5–50.0)
Hemoglobin: 14.3 g/dL (ref 13.2–17.1)
Lymphs Abs: 2640 cells/uL (ref 850–3900)
MCH: 26.6 pg — ABNORMAL LOW (ref 27.0–33.0)
MCHC: 32.4 g/dL (ref 32.0–36.0)
MCV: 82.1 fL (ref 80.0–100.0)
Monocytes Relative: 8 %
Neutro Abs: 4112 cells/uL (ref 1500–7800)
Neutrophils Relative %: 51.4 %
Platelets: 142 10*3/uL (ref 140–400)
RBC: 5.37 10*6/uL (ref 4.20–5.80)
RDW: 14.3 % (ref 11.0–15.0)
Total Lymphocyte: 33 %
WBC: 8 10*3/uL (ref 3.8–10.8)

## 2021-08-30 LAB — COMPLETE METABOLIC PANEL WITH GFR
AG Ratio: 1.5 (calc) (ref 1.0–2.5)
ALT: 13 U/L (ref 9–46)
AST: 17 U/L (ref 10–35)
Albumin: 4.4 g/dL (ref 3.6–5.1)
Alkaline phosphatase (APISO): 71 U/L (ref 35–144)
BUN: 20 mg/dL (ref 7–25)
CO2: 30 mmol/L (ref 20–32)
Calcium: 9.7 mg/dL (ref 8.6–10.3)
Chloride: 103 mmol/L (ref 98–110)
Creat: 1.05 mg/dL (ref 0.70–1.35)
Globulin: 2.9 g/dL (calc) (ref 1.9–3.7)
Glucose, Bld: 158 mg/dL — ABNORMAL HIGH (ref 65–99)
Potassium: 3.8 mmol/L (ref 3.5–5.3)
Sodium: 142 mmol/L (ref 135–146)
Total Bilirubin: 0.6 mg/dL (ref 0.2–1.2)
Total Protein: 7.3 g/dL (ref 6.1–8.1)
eGFR: 77 mL/min/{1.73_m2} (ref >=60)

## 2021-08-30 LAB — PSA: PSA: 0.91 ng/mL (ref <=4.00)

## 2021-08-30 LAB — HEMOGLOBIN A1C W/OUT EAG: Hgb A1c MFr Bld: 5.5 % of total Hgb (ref <5.7)

## 2021-09-01 NOTE — Progress Notes (Signed)
A1C continues to be in normal range which is great news at 5.5.

## 2021-09-03 DIAGNOSIS — R972 Elevated prostate specific antigen [PSA]: Secondary | ICD-10-CM | POA: Diagnosis not present

## 2021-09-03 DIAGNOSIS — R3915 Urgency of urination: Secondary | ICD-10-CM | POA: Diagnosis not present

## 2021-09-07 ENCOUNTER — Other Ambulatory Visit: Payer: Self-pay | Admitting: Physician Assistant

## 2021-09-07 DIAGNOSIS — I10 Essential (primary) hypertension: Secondary | ICD-10-CM

## 2021-09-07 DIAGNOSIS — Z8673 Personal history of transient ischemic attack (TIA), and cerebral infarction without residual deficits: Secondary | ICD-10-CM

## 2021-09-11 ENCOUNTER — Other Ambulatory Visit: Payer: Self-pay | Admitting: Physician Assistant

## 2021-09-11 DIAGNOSIS — Z8673 Personal history of transient ischemic attack (TIA), and cerebral infarction without residual deficits: Secondary | ICD-10-CM

## 2021-09-11 DIAGNOSIS — I10 Essential (primary) hypertension: Secondary | ICD-10-CM

## 2021-09-15 DIAGNOSIS — R972 Elevated prostate specific antigen [PSA]: Secondary | ICD-10-CM | POA: Diagnosis not present

## 2021-09-15 DIAGNOSIS — R3915 Urgency of urination: Secondary | ICD-10-CM | POA: Diagnosis not present

## 2021-09-15 DIAGNOSIS — N4889 Other specified disorders of penis: Secondary | ICD-10-CM | POA: Diagnosis not present

## 2021-09-15 DIAGNOSIS — R102 Pelvic and perineal pain: Secondary | ICD-10-CM | POA: Diagnosis not present

## 2021-09-15 DIAGNOSIS — T83410A Breakdown (mechanical) of penile (implanted) prosthesis, initial encounter: Secondary | ICD-10-CM | POA: Diagnosis not present

## 2021-09-22 ENCOUNTER — Other Ambulatory Visit: Payer: Self-pay | Admitting: Physician Assistant

## 2021-09-22 DIAGNOSIS — I10 Essential (primary) hypertension: Secondary | ICD-10-CM

## 2021-09-22 DIAGNOSIS — Z8673 Personal history of transient ischemic attack (TIA), and cerebral infarction without residual deficits: Secondary | ICD-10-CM

## 2021-09-29 ENCOUNTER — Other Ambulatory Visit: Payer: Self-pay | Admitting: Physician Assistant

## 2021-09-29 DIAGNOSIS — F419 Anxiety disorder, unspecified: Secondary | ICD-10-CM

## 2021-09-29 DIAGNOSIS — N400 Enlarged prostate without lower urinary tract symptoms: Secondary | ICD-10-CM

## 2021-09-30 NOTE — Telephone Encounter (Signed)
Please advise on refills: ? ?Doxazosin - last written June of 2022 for 90 days no refills, should he be taking?  ? ?Xanax - Last written 08/05/2021 #30 no refills  ? ?Last appt 08/25/2021 ?

## 2021-10-02 ENCOUNTER — Other Ambulatory Visit: Payer: Self-pay

## 2021-10-02 DIAGNOSIS — I739 Peripheral vascular disease, unspecified: Secondary | ICD-10-CM

## 2021-10-07 ENCOUNTER — Encounter: Payer: Self-pay | Admitting: Vascular Surgery

## 2021-10-07 ENCOUNTER — Ambulatory Visit: Payer: Medicare HMO | Admitting: Vascular Surgery

## 2021-10-07 ENCOUNTER — Ambulatory Visit (HOSPITAL_COMMUNITY)
Admission: RE | Admit: 2021-10-07 | Discharge: 2021-10-07 | Disposition: A | Discharge status: Home / Self Care | Payer: Medicare HMO | Source: Ambulatory Visit | Attending: Vascular Surgery | Admitting: Vascular Surgery

## 2021-10-07 VITALS — BP 173/77 | HR 67 | Temp 97.9°F | Resp 16 | Ht 65.5 in | Wt 166.0 lb

## 2021-10-07 DIAGNOSIS — I739 Peripheral vascular disease, unspecified: Secondary | ICD-10-CM

## 2021-10-07 NOTE — Progress Notes (Signed)
? ? ?Patient name: Travis Barajas MRN: [id number] DOB: [birth year] Sex: male ? ?REASON FOR CONSULT: Lower extremity claudication ? ?HPI: ?Travis Barajas is a 71 y.o. male, with history of hypertension, hyperlipidemia, previous stroke that presents for evaluation of bilateral lower extremity claudication.  Patient states he has had pain in both legs particularly in the calves when walking that really limits his ability to live life.  He does okay at rest and then when he starts walking he is really debilitated by both legs.  This limits his ability to go to the grocery store, mow his yard and is really unable to do any walking with his wife.  He was previously seen by Dr. Donnetta Hutching in 2016 and offered arteriogram which ultimately was deferred.  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  States he smokes a cigar once in awhile. ? ?Past Medical History:  ?Diagnosis Date  ? Allergy   ? Anxiety   ? Arthritis   ? COPD (chronic obstructive pulmonary disease) ([place])   ? Hyperlipidemia   ? Hypertension   ? Stroke [facility])   ? ? ?Past Surgical History:  ?Procedure Laterality Date  ? FOOT SURGERY    ? ? ?Family History  ?Problem Relation Age of Onset  ? Diabetes Mother   ? Hypertension Mother   ? Alzheimer's disease Mother   ? CAD Mother   ? Heart disease Mother   ? Hyperlipidemia Mother   ? Diabetes Father   ? Hypertension Father   ? Cancer Brother   ? Peripheral vascular disease Brother   ? Cancer Daughter   ? ? ?SOCIAL HISTORY: ?Social History  ? ?Socioeconomic History  ? Marital status: Married  ?  Spouse name: Georga Kaufmann  ? Number of children: 7  ? Years of education: 47  ? Highest education level: Some college, no degree  ?Occupational History  ?  Comment: Retired  ?Tobacco Use  ? Smoking status: Former  ?  Packs/day: 0.50  ?  Years: 40.00  ?  Pack years: 20.00  ?  Types: Cigarettes  ?  Quit date: 02/15/2019  ?  Years since quitting: 2.6  ? Smokeless  tobacco: Never  ?Vaping Use  ? Vaping Use: Never used  ?Substance and Sexual Activity  ? Alcohol use: Not Currently  ?  Alcohol/week: 0.0 standard drinks  ? Drug use: No  ? Sexual activity: Yes  ?Other Topics Concern  ? Not on file  ?Social History Narrative  ? Not on file  ? ?Social Determinants of Health  ? ?Financial Resource Strain: Not on file  ?Food Insecurity: Not on file  ?Transportation Needs: Not on file  ?Physical Activity: Not on file  ?Stress: Not on file  ?Social Connections: Not on file  ?Intimate Partner Violence: Not on file  ? ? ?Allergies  ?Allergen Reactions  ? Cialis [Tadalafil]   ?  Hallucinations   ? Lipitor [Atorvastatin]   ?  itching  ? Niacin And Related Itching  ? Pravachol [Pravastatin] Itching  ?  Itching.   ? ? ?Current Outpatient Medications  ?Medication Sig Dispense Refill  ? albuterol (VENTOLIN HFA) 108 (90 Base) MCG/ACT inhaler Inhale 2 puffs into the lungs every 6 (six) hours as needed. 16 g 1  ? ALPRAZolam (XANAX) 0.5 MG tablet Take 1 tablet by mouth twice daily as needed for anxiety 30 tablet 0  ? AMBULATORY NON FORMULARY MEDICATION  Nebulizer machine and supplies for wheezing/retractions/asthma exacerbations. 1 Device 0  ? amLODipine (NORVASC) 10 MG tablet Take 1 tablet by mouth once daily 90 tablet 1  ? budesonide-formoterol (SYMBICORT) 160-4.5 MCG/ACT inhaler Inhale 2 puffs into the lungs 2 (two) times daily. 3 each 1  ? clopidogrel (PLAVIX) 75 MG tablet Take 1 tablet by mouth once daily 90 tablet 0  ? diclofenac Sodium (VOLTAREN) 1 % GEL Apply 4 g topically 4 (four) times daily. To affected joint. 100 g 11  ? doxazosin (CARDURA) 4 MG tablet Take 1 tablet by mouth twice daily 180 tablet 0  ? escitalopram (LEXAPRO) 5 MG tablet Take 1 tablet (5 mg total) by mouth daily. 30 tablet 2  ? furosemide (LASIX) 20 MG tablet Take 1 tablet by mouth once daily 90 tablet 0  ? hydrALAZINE (APRESOLINE) 25 MG tablet TAKE 1 TABLET BY MOUTH THREE TIMES DAILY 270 tablet 1  ? ibuprofen (ADVIL) 600  MG tablet Take 600 mg by mouth every 6 (six) hours as needed.    ? ipratropium-albuterol (DUONEB) 0.5-2.5 (3) MG/3ML SOLN Take 3 mLs by nebulization every 2 (two) hours as needed. 360 mL 0  ? levocetirizine (XYZAL) 5 MG tablet Take 1 tablet (5 mg total) by mouth daily. 90 tablet 3  ? losartan (COZAAR) 50 MG tablet Take 1 tablet by mouth once daily 90 tablet 0  ? metoprolol succinate (TOPROL-XL) 100 MG 24 hr tablet TAKE 1 TABLET BY MOUTH ONCE DAILY TAKE  WITH  OR  IMMEDIATELY  FOLLOWING  A  MEAL 90 tablet 1  ? montelukast (SINGULAIR) 10 MG tablet TAKE 1 TABLET BY MOUTH AT BEDTIME 90 tablet 0  ? oxymetazoline (AFRIN SINUS) 0.05 % nasal spray Place 1 spray into both nostrils 2 (two) times daily. Only for 3 days. 30 mL 0  ? Pitavastatin Calcium 2 MG TABS Take 1 tablet (2 mg total) by mouth daily. 90 tablet 3  ? Pitavastatin Magnesium (ZYPITAMAG) 1 MG TABS Take 1 mg by mouth daily. 90 tablet 3  ? rosuvastatin (CRESTOR) 10 MG tablet Take 1 tablet (10 mg total) by mouth daily. 90 tablet 3  ? traZODone (DESYREL) 100 MG tablet Take 2 tablets (200 mg total) by mouth at bedtime. 180 tablet 1  ? umeclidinium bromide (INCRUSE ELLIPTA) 62.5 MCG/INH AEPB Inhale 1 puff into the lungs daily. 30 each 5  ? ?No current facility-administered medications for this visit.  ? ? ?REVIEW OF SYSTEMS:  ?'[X]'$  denotes positive finding, '[ ]'$  denotes negative finding ?Cardiac  Comments:  ?Chest pain or chest pressure:    ?Shortness of breath upon exertion:    ?Short of breath when lying flat:    ?Irregular heart rhythm:    ?    ?Vascular    ?Pain in calf, thigh, or hip brought on by ambulation: x   ?Pain in feet at night that wakes you up from your sleep:     ?Blood clot in your veins:    ?Leg swelling:     ?    ?Pulmonary    ?Oxygen at home:    ?Productive cough:     ?Wheezing:     ?    ?Neurologic    ?Sudden weakness in arms or legs:     ?Sudden numbness in arms or legs:     ?Sudden onset of difficulty speaking or slurred speech:    ?Temporary loss  of vision in one eye:     ?Problems with dizziness:     ?    ?  Gastrointestinal    ?Blood in stool:     ?Vomited blood:     ?    ?Genitourinary    ?Burning when urinating:     ?Blood in urine:    ?    ?Psychiatric    ?Major depression:     ?    ?Hematologic    ?Bleeding problems:    ?Problems with blood clotting too easily:    ?    ?Skin    ?Rashes or ulcers:    ?    ?Constitutional    ?Fever or chills:    ? ? ?PHYSICAL EXAM: ?Vitals:  ? 10/07/21 1445  ?BP: (!) 173/77  ?Pulse: 67  ?Resp: 16  ?Temp: 97.9 ?F (36.6 ?C)  ?TempSrc: Temporal  ?Weight: 166 lb (75.3 kg)  ?Height: 5' 5.5" (1.664 m)  ? ? ?GENERAL: The patient is a well-nourished male, in no acute distress. The vital signs are documented above. ?CARDIAC: There is a regular rate and rhythm.  ?VASCULAR:  ?Right femoral pulse 2+ palpable ?Left femoral pulse weaker and 1+ palpable ?PULMONARY: No respiratory distress ?ABDOMEN: Soft and non-tender. ?MUSCULOSKELETAL: There are no major deformities or cyanosis. ?NEUROLOGIC: No focal weakness or paresthesias are detected. ?SKIN: There are no ulcers or rashes noted. ?PSYCHIATRIC: The patient has a normal affect. ? ?DATA:  ? ?ABIs today are 0.55 on the right biphasic and 0.63 on the left biphasic ? ?Assessment/Plan: ? ?71 year old male presents for evaluation of lifestyle-limiting claudication in the bilateral lower extremities.  I discussed with him the nature of claudication and that this is not typically considered limb threatening.  I discussed typical lifestyle management including exercise with walking therapy, no smoking, and other lifestyle modifications.  Ultimately he feels this is very debilitating and wants to proceed with intervention.  He was previously offered an arteriogram in 2016 by Dr. Donnetta Hutching and this was deferred and he feels his symptoms are much worse.  I discussed right transfemoral access with aortogram, bilateral lower extremity arteriogram, and possible intervention with initial focus on the left  leg.  Risk benefits discussed.  We will get him scheduled for the Cath Lab. ? ? ?[name], MD ?[facility] ?Office: [phone] ? ? ? ? ?

## 2021-10-10 ENCOUNTER — Other Ambulatory Visit: Payer: Self-pay

## 2021-10-16 ENCOUNTER — Ambulatory Visit (HOSPITAL_COMMUNITY)
Admission: RE | Admit: 2021-10-16 | Discharge: 2021-10-16 | Disposition: A | Discharge status: Home / Self Care | Payer: Medicare HMO | Source: Ambulatory Visit | Attending: Vascular Surgery | Admitting: Vascular Surgery

## 2021-10-16 ENCOUNTER — Other Ambulatory Visit: Payer: Self-pay

## 2021-10-16 ENCOUNTER — Encounter (HOSPITAL_COMMUNITY)
Admission: RE | Disposition: A | Discharge status: Home / Self Care | Payer: Self-pay | Source: Ambulatory Visit | Attending: Vascular Surgery

## 2021-10-16 DIAGNOSIS — E785 Hyperlipidemia, unspecified: Secondary | ICD-10-CM | POA: Diagnosis not present

## 2021-10-16 DIAGNOSIS — I70213 Atherosclerosis of native arteries of extremities with intermittent claudication, bilateral legs: Secondary | ICD-10-CM | POA: Insufficient documentation

## 2021-10-16 DIAGNOSIS — I10 Essential (primary) hypertension: Secondary | ICD-10-CM | POA: Insufficient documentation

## 2021-10-16 DIAGNOSIS — F17290 Nicotine dependence, other tobacco product, uncomplicated: Secondary | ICD-10-CM | POA: Insufficient documentation

## 2021-10-16 DIAGNOSIS — Z8673 Personal history of transient ischemic attack (TIA), and cerebral infarction without residual deficits: Secondary | ICD-10-CM | POA: Diagnosis not present

## 2021-10-16 DIAGNOSIS — R69 Illness, unspecified: Secondary | ICD-10-CM | POA: Diagnosis not present

## 2021-10-16 HISTORY — PX: PERIPHERAL VASCULAR INTERVENTION: CATH118257

## 2021-10-16 HISTORY — PX: ABDOMINAL AORTOGRAM W/LOWER EXTREMITY: CATH118223

## 2021-10-16 LAB — POCT I-STAT, CHEM 8
BUN: 7 mg/dL — ABNORMAL LOW (ref 8–23)
Calcium, Ion: 1.1 mmol/L — ABNORMAL LOW (ref 1.15–1.40)
Chloride: 102 mmol/L (ref 98–111)
Creatinine, Ser: 0.9 mg/dL (ref 0.61–1.24)
Glucose, Bld: 82 mg/dL (ref 70–99)
HCT: 43 % (ref 39.0–52.0)
Hemoglobin: 14.6 g/dL (ref 13.0–17.0)
Potassium: 3.1 mmol/L — ABNORMAL LOW (ref 3.5–5.1)
Sodium: 142 mmol/L (ref 135–145)
TCO2: 30 mmol/L (ref 22–32)

## 2021-10-16 LAB — POCT ACTIVATED CLOTTING TIME: Activated Clotting Time: 257 seconds

## 2021-10-16 SURGERY — ABDOMINAL AORTOGRAM W/LOWER EXTREMITY
Anesthesia: LOCAL

## 2021-10-16 MED ORDER — LABETALOL HCL 5 MG/ML IV SOLN: 10.0000 mg | INTRAVENOUS | Status: DC | PRN

## 2021-10-16 MED ORDER — ASPIRIN EC 81 MG PO TBEC: 81.0000 mg | DELAYED_RELEASE_TABLET | Freq: Every day | ORAL | 2 refills | Status: AC

## 2021-10-16 MED ORDER — SODIUM CHLORIDE 0.9 % IV SOLN: 250.0000 mL | INTRAVENOUS | Status: DC | PRN

## 2021-10-16 MED ORDER — HYDRALAZINE HCL 20 MG/ML IJ SOLN
INTRAMUSCULAR | Status: AC
  Filled 2021-10-16: qty 1

## 2021-10-16 MED ORDER — HEPARIN (PORCINE) IN NACL 1000-0.9 UT/500ML-% IV SOLN
INTRAVENOUS | Status: DC | PRN
  Administered 2021-10-16 (×2): 500 mL

## 2021-10-16 MED ORDER — HYDRALAZINE HCL 20 MG/ML IJ SOLN
INTRAMUSCULAR | Status: DC | PRN
  Administered 2021-10-16 (×2): 10 mg via INTRAVENOUS

## 2021-10-16 MED ORDER — LIDOCAINE HCL (PF) 1 % IJ SOLN
INTRAMUSCULAR | Status: AC
  Filled 2021-10-16: qty 30

## 2021-10-16 MED ORDER — HYDRALAZINE HCL 20 MG/ML IJ SOLN: 5.0000 mg | INTRAMUSCULAR | Status: DC | PRN

## 2021-10-16 MED ORDER — HEPARIN (PORCINE) IN NACL 1000-0.9 UT/500ML-% IV SOLN
INTRAVENOUS | Status: AC
  Filled 2021-10-16: qty 1000

## 2021-10-16 MED ORDER — SODIUM CHLORIDE 0.9% FLUSH: 3.0000 mL | INTRAVENOUS | Status: DC | PRN

## 2021-10-16 MED ORDER — HEPARIN SODIUM (PORCINE) 1000 UNIT/ML IJ SOLN
INTRAMUSCULAR | Status: DC | PRN
  Administered 2021-10-16: 7000 [IU] via INTRAVENOUS

## 2021-10-16 MED ORDER — ASPIRIN 81 MG PO CHEW
CHEWABLE_TABLET | ORAL | Status: AC
  Filled 2021-10-16: qty 1

## 2021-10-16 MED ORDER — MIDAZOLAM HCL 2 MG/2ML IJ SOLN
INTRAMUSCULAR | Status: AC
  Filled 2021-10-16: qty 2

## 2021-10-16 MED ORDER — IODIXANOL 320 MG/ML IV SOLN
INTRAVENOUS | Status: DC | PRN
Start: 2021-10-16 — End: 2021-10-16
  Administered 2021-10-16: 135 mL via INTRA_ARTERIAL

## 2021-10-16 MED ORDER — FENTANYL CITRATE (PF) 100 MCG/2ML IJ SOLN
INTRAMUSCULAR | Status: DC | PRN
  Administered 2021-10-16: 25 ug via INTRAVENOUS

## 2021-10-16 MED ORDER — ASPIRIN EC 81 MG PO TBEC
81.0000 mg | DELAYED_RELEASE_TABLET | Freq: Every day | ORAL | Status: DC
  Administered 2021-10-16: 81 mg via ORAL

## 2021-10-16 MED ORDER — CLOPIDOGREL BISULFATE 75 MG PO TABS
ORAL_TABLET | ORAL | Status: AC
  Filled 2021-10-16: qty 1

## 2021-10-16 MED ORDER — CLOPIDOGREL BISULFATE 300 MG PO TABS
ORAL_TABLET | ORAL | Status: DC | PRN
  Administered 2021-10-16: 75 mg via ORAL

## 2021-10-16 MED ORDER — SODIUM CHLORIDE 0.9% FLUSH: 3.0000 mL | Freq: Two times a day (BID) | INTRAVENOUS | Status: DC

## 2021-10-16 MED ORDER — SODIUM CHLORIDE 0.9 % IV SOLN: INTRAVENOUS | Status: AC

## 2021-10-16 MED ORDER — SODIUM CHLORIDE 0.9 % IV SOLN: INTRAVENOUS | Status: DC

## 2021-10-16 MED ORDER — ONDANSETRON HCL 4 MG/2ML IJ SOLN: 4.0000 mg | Freq: Four times a day (QID) | INTRAMUSCULAR | Status: DC | PRN

## 2021-10-16 MED ORDER — MIDAZOLAM HCL 2 MG/2ML IJ SOLN
INTRAMUSCULAR | Status: DC | PRN
  Administered 2021-10-16: 1 mg via INTRAVENOUS

## 2021-10-16 MED ORDER — FENTANYL CITRATE (PF) 100 MCG/2ML IJ SOLN
INTRAMUSCULAR | Status: AC
  Filled 2021-10-16: qty 2

## 2021-10-16 MED ORDER — POTASSIUM CHLORIDE CRYS ER 20 MEQ PO TBCR
EXTENDED_RELEASE_TABLET | ORAL | Status: AC
  Filled 2021-10-16: qty 2

## 2021-10-16 MED ORDER — POTASSIUM CHLORIDE ER 10 MEQ PO TBCR
30.0000 meq | EXTENDED_RELEASE_TABLET | Freq: Once | ORAL | Status: AC
  Administered 2021-10-16: 30 meq via ORAL
  Filled 2021-10-16: qty 3

## 2021-10-16 MED ORDER — LIDOCAINE HCL (PF) 1 % IJ SOLN
INTRAMUSCULAR | Status: DC | PRN
Start: 2021-10-16 — End: 2021-10-16
  Administered 2021-10-16: 18 mL via INTRADERMAL

## 2021-10-16 MED ORDER — ACETAMINOPHEN 325 MG PO TABS: 650.0000 mg | ORAL_TABLET | ORAL | Status: DC | PRN

## 2021-10-16 SURGICAL SUPPLY — 14 items
BALLN MUSTANG 7X80X75 (BALLOONS) ×3
BALLOON MUSTANG 7X80X75 (BALLOONS) IMPLANT
CATH OMNI FLUSH 5F 65CM (CATHETERS) ×1 IMPLANT
GLIDEWIRE ADV .035X260CM (WIRE) ×1 IMPLANT
KIT ENCORE 26 ADVANTAGE (KITS) ×1 IMPLANT
KIT MICROPUNCTURE NIT STIFF (SHEATH) ×1 IMPLANT
KIT PV (KITS) ×3 IMPLANT
SHEATH FLEXOR ANSEL 1 7F 45CM (SHEATH) ×1 IMPLANT
SHEATH PINNACLE 5F 10CM (SHEATH) ×1 IMPLANT
STENT INNOVA 8X80X130 (Permanent Stent) ×1 IMPLANT
SYR MEDRAD MARK V 150ML (SYRINGE) ×1 IMPLANT
TRANSDUCER W/STOPCOCK (MISCELLANEOUS) ×3 IMPLANT
TRAY PV CATH (CUSTOM PROCEDURE TRAY) ×3 IMPLANT
WIRE BENTSON .035X145CM (WIRE) ×1 IMPLANT

## 2021-10-16 NOTE — Progress Notes (Signed)
Dr. Carlis Abbott called, msg left regarding this pt, K+ = 3.1, no answer at this time ?

## 2021-10-16 NOTE — Progress Notes (Signed)
Attempted to call Travis Barajas several times to notify of K level 3.1.  Also notified Stacy in Cath lab of K level 3.1. ?

## 2021-10-16 NOTE — Op Note (Signed)
? ? ?Patient name: Travis Barajas MRN: [id number] DOB: [birth year] Sex: male ? ?10/16/2021 ?Pre-operative Diagnosis: Lifestyle limiting short distance claudication bilateral lower extremities ?Post-operative diagnosis:  Same ?Surgeon:  [name], MD ?Procedure Performed: ?1.  Ultrasound-guided access right common femoral artery ?2.  Aortogram with catheter selection of aorta ?3.  Bilateral lower extremity arteriogram with runoff ?4.  Left external iliac artery angioplasty with stent placement (primarily stented with 8 mm x 80 mm self-expanding Innova postdilated with a 7 mm x 80 mm Mustang) ?5.  45 minutes of monitored moderate conscious sedation time ? ?Indications: 71 year old male seen with bilateral lower extremity short distance lifestyle limiting claudication initially evaluated by Dr. Donnetta Hutching in 2016 and offered arteriogram at that time.  He has had worsening symptoms of lifestyle limiting claudication in both legs.  Plan aortogram, lower extremity arteriogram with initial focus on the left leg today and possible intervention. ? ?Findings:  ? ?Aortogram showed no flow-limiting stenosis in the aorta with patent renal arteries bilaterally.  The left common iliac is calcified but patent.  The left hypogastric is diseased.  The left external iliac artery has 3 focal areas of high-grade stenosis greater than 90% with a patent distal external iliac as well as a patent common femoral and profunda.  The SFA is diffusely diseased without flow-limiting stenosis on the left.  Popliteal arteries patent and three-vessel runoff although the peroneal is diminutive distally. ? ?The right lower extremity shows a patent common iliac and external iliac artery with a patent common femoral and profunda.  There is a stump of SFA proximally that is patent with a long segment SFA occlusion and reconstitution of a diseased above-knee popliteal artery also with patent below-knee and three-vessel runoff. ? ?The left external  iliac stenosis was crossed from right transfemoral access and then stented with an 8 mm x 80 mm Innova postdilated with an 8 mm Mustang.  Excellent results with no residual stenosis and preserved runoff. ?  ?Procedure:  The patient was identified in the holding area and taken to room 8.  The patient was then placed supine on the table and prepped and draped in the usual sterile fashion.  A time out was called.  Ultrasound was used to evaluate the right common femoral artery.  It was patent .  A digital ultrasound image was acquired.  A micropuncture needle was used to access the right common femoral artery under ultrasound guidance.  An 018 wire was advanced without resistance and a micropuncture sheath was placed.  The 018 wire was removed and a benson wire was placed.  The micropuncture sheath was exchanged for a 5 french sheath.  An omniflush catheter was advanced over the wire to the level of L-1.  An abdominal angiogram was obtained.  Next the catheter was pulled down and bilateral lower extremity runoff was obtained.  Pertinent findings are noted above.  Initial plan was to stent his left external iliac artery in the diseased segments as noted above.  I used a Glidewire advantage with Omni Flush catheter to cross the aortic bifurcation and got my wire into the distal left external iliac artery and down into the SFA and crossed the left external iliac artery stenosis.  Patient was given 100 units/kg IV heparin.  7 [place] long Ansell sheath was placed in the right common femoral artery over the aortic bifurcation.  Ultimately we then got a planning hand-injection.  The external iliac artery was then stented with an 8 mm x  80 mm self-expanding Innova postdilated with a 7 mm Mustang.  Excellent results with widely patent stent and no significant residual stenosis.  We shot a left leg arteriogram after this to ensure that preserved runoff with no distal embolization.  Catheter was pulled over the aortic bifurcation  and wires removed.  He was taken to holding in stable condition to have the sheath removed. ? ?Plan: Patient with excellent results after stenting of his left external iliac artery today.  We will arrange follow-up in 1 month.  Continue stain and plavix and will add aspirin.  If he has improvement in left leg symptoms we can discuss intervention on the right leg in the future. ? ? ?[name], MD ?[name] ?Office: [phone] ? ? ?

## 2021-10-16 NOTE — Progress Notes (Signed)
Patient and wife was given discharge instructions. Both verbalized understanding. 

## 2021-10-16 NOTE — Progress Notes (Signed)
42fench sheath removed from right femoral artery. Pressure held x 20 minutes and dressed with 4x4 and tegaderm.  Site level 0 with no hematoma.  Vitals stable during sheath removal.   ?

## 2021-10-16 NOTE — H&P (Signed)
History and Physical Interval Note: ? ?10/16/2021 ?7:51 AM ? ?Travis Barajas  has presented today for surgery, with the diagnosis of PVD.  The various methods of treatment have been discussed with the patient and family. After consideration of risks, benefits and other options for treatment, the patient has consented to  Procedure(s): ?ABDOMINAL AORTOGRAM W/LOWER EXTREMITY (N/A) as a surgical intervention.  The patient's history has been reviewed, patient examined, no change in status, stable for surgery.  I have reviewed the patient's chart and labs.  Questions were answered to the patient's satisfaction.   ? ? ?[name] ? ?Patient name: Travis Barajas  MRN: [id number]        DOB: 01/05/1951          Sex: male ?  ?REASON FOR CONSULT: Lower extremity claudication ?  ?HPI: ?Travis Barajas is a 71 y.o. male, with history of hypertension, hyperlipidemia, previous stroke that presents for evaluation of bilateral lower extremity claudication.  Patient states he has had pain in both legs particularly in the calves when walking that really limits his ability to live life.  He does okay at rest and then when he starts walking he is really debilitated by both legs.  This limits his ability to go to the grocery store, mow his yard and is really unable to do any walking with his wife.  He was previously seen by Dr. Donnetta Hutching in 2016 and offered arteriogram which ultimately was deferred.  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  States he smokes a cigar once in awhile. ?  ?    ?Past Medical History:  ?Diagnosis Date  ? Allergy    ? Anxiety    ? Arthritis    ? COPD (chronic obstructive pulmonary disease) ([place])    ? Hyperlipidemia    ? Hypertension    ? Stroke [facility])    ?  ?  ?     ?Past Surgical History:  ?Procedure Laterality Date  ? FOOT SURGERY      ?  ?  ?     ?Family History  ?Problem Relation Age of Onset  ? Diabetes Mother    ?  Hypertension Mother    ? Alzheimer's disease Mother    ? CAD Mother    ? Heart disease Mother    ? Hyperlipidemia Mother    ? Diabetes Father    ? Hypertension Father    ? Cancer Brother    ? Peripheral vascular disease Brother    ? Cancer Daughter    ?  ?  ?SOCIAL HISTORY: ?Social History  ?  ?     ?Socioeconomic History  ? Marital status: Married  ?    Spouse name: Travis Barajas  ? Number of children: 7  ? Years of education: 28  ? Highest education level: Some college, no degree  ?Occupational History  ?    Comment: Retired  ?Tobacco Use  ? Smoking status: Former  ?    Packs/day: 0.50  ?    Years: 40.00  ?    Pack years: 20.00  ?    Types: Cigarettes  ?    Quit date: 02/15/2019  ?    Years since quitting: 2.6  ? Smokeless tobacco: Never  ?Vaping Use  ? Vaping Use: Never used  ?Substance and Sexual Activity  ? Alcohol use: Not Currently  ?  Alcohol/week: 0.0 standard drinks  ? Drug use: No  ? Sexual activity: Yes  ?Other Topics Concern  ? Not on file  ?Social History Narrative  ? Not on file  ?  ?Social Determinants of Health  ?  ?Financial Resource Strain: Not on file  ?Food Insecurity: Not on file  ?Transportation Needs: Not on file  ?Physical Activity: Not on file  ?Stress: Not on file  ?Social Connections: Not on file  ?Intimate Partner Violence: Not on file  ?  ?  ?     ?Allergies  ?Allergen Reactions  ? Cialis [Tadalafil]    ?    Hallucinations   ? Lipitor [Atorvastatin]    ?    itching  ? Niacin And Related Itching  ? Pravachol [Pravastatin] Itching  ?    Itching.   ?  ?  ?      ?Current Outpatient Medications  ?Medication Sig Dispense Refill  ? albuterol (VENTOLIN HFA) 108 (90 Base) MCG/ACT inhaler Inhale 2 puffs into the lungs every 6 (six) hours as needed. 16 g 1  ? ALPRAZolam (XANAX) 0.5 MG tablet Take 1 tablet by mouth twice daily as needed for anxiety 30 tablet 0  ? AMBULATORY NON FORMULARY MEDICATION Nebulizer machine and supplies for wheezing/retractions/asthma exacerbations. 1 Device 0  ? amLODipine  (NORVASC) 10 MG tablet Take 1 tablet by mouth once daily 90 tablet 1  ? budesonide-formoterol (SYMBICORT) 160-4.5 MCG/ACT inhaler Inhale 2 puffs into the lungs 2 (two) times daily. 3 each 1  ? clopidogrel (PLAVIX) 75 MG tablet Take 1 tablet by mouth once daily 90 tablet 0  ? diclofenac Sodium (VOLTAREN) 1 % GEL Apply 4 g topically 4 (four) times daily. To affected joint. 100 g 11  ? doxazosin (CARDURA) 4 MG tablet Take 1 tablet by mouth twice daily 180 tablet 0  ? escitalopram (LEXAPRO) 5 MG tablet Take 1 tablet (5 mg total) by mouth daily. 30 tablet 2  ? furosemide (LASIX) 20 MG tablet Take 1 tablet by mouth once daily 90 tablet 0  ? hydrALAZINE (APRESOLINE) 25 MG tablet TAKE 1 TABLET BY MOUTH THREE TIMES DAILY 270 tablet 1  ? ibuprofen (ADVIL) 600 MG tablet Take 600 mg by mouth every 6 (six) hours as needed.      ? ipratropium-albuterol (DUONEB) 0.5-2.5 (3) MG/3ML SOLN Take 3 mLs by nebulization every 2 (two) hours as needed. 360 mL 0  ? levocetirizine (XYZAL) 5 MG tablet Take 1 tablet (5 mg total) by mouth daily. 90 tablet 3  ? losartan (COZAAR) 50 MG tablet Take 1 tablet by mouth once daily 90 tablet 0  ? metoprolol succinate (TOPROL-XL) 100 MG 24 hr tablet TAKE 1 TABLET BY MOUTH ONCE DAILY TAKE  WITH  OR  IMMEDIATELY  FOLLOWING  A  MEAL 90 tablet 1  ? montelukast (SINGULAIR) 10 MG tablet TAKE 1 TABLET BY MOUTH AT BEDTIME 90 tablet 0  ? oxymetazoline (AFRIN SINUS) 0.05 % nasal spray Place 1 spray into both nostrils 2 (two) times daily. Only for 3 days. 30 mL 0  ? Pitavastatin Calcium 2 MG TABS Take 1 tablet (2 mg total) by mouth daily. 90 tablet 3  ? Pitavastatin Magnesium (ZYPITAMAG) 1 MG TABS Take 1 mg by mouth daily. 90 tablet 3  ? rosuvastatin (CRESTOR) 10 MG tablet Take 1 tablet (10 mg total) by mouth daily. 90 tablet 3  ? traZODone (DESYREL) 100 MG tablet Take 2 tablets (200 mg total) by mouth at bedtime. 180 tablet  1  ? umeclidinium bromide (INCRUSE ELLIPTA) 62.5 MCG/INH AEPB Inhale 1 puff into the lungs  daily. 30 each 5  ?  ?No current facility-administered medications for this visit.  ?  ?  ?REVIEW OF SYSTEMS:  ?'[X]'$  denotes positive finding, '[ ]'$  denotes negative finding ?Cardiac   Comments:  ?Chest pain or chest pressure:      ?Shortness of breath upon exertion:      ?Short of breath when lying flat:      ?Irregular heart rhythm:      ?       ?Vascular      ?Pain in calf, thigh, or hip brought on by ambulation: x    ?Pain in feet at night that wakes you up from your sleep:       ?Blood clot in your veins:      ?Leg swelling:       ?       ?Pulmonary      ?Oxygen at home:      ?Productive cough:       ?Wheezing:       ?       ?Neurologic      ?Sudden weakness in arms or legs:       ?Sudden numbness in arms or legs:       ?Sudden onset of difficulty speaking or slurred speech:      ?Temporary loss of vision in one eye:       ?Problems with dizziness:       ?       ?Gastrointestinal      ?Blood in stool:       ?Vomited blood:       ?       ?Genitourinary      ?Burning when urinating:       ?Blood in urine:      ?       ?Psychiatric      ?Major depression:       ?       ?Hematologic      ?Bleeding problems:      ?Problems with blood clotting too easily:      ?       ?Skin      ?Rashes or ulcers:      ?       ?Constitutional      ?Fever or chills:      ?  ?  ?PHYSICAL EXAM: ?   ?Vitals:  ?  10/07/21 1445  ?BP: (!) 173/77  ?Pulse: 67  ?Resp: 16  ?Temp: 97.9 ?F (36.6 ?C)  ?TempSrc: Temporal  ?Weight: 166 lb (75.3 kg)  ?Height: 5' 5.5" (1.664 m)  ?  ?  ?GENERAL: The patient is a well-nourished male, in no acute distress. The vital signs are documented above. ?CARDIAC: There is a regular rate and rhythm.  ?VASCULAR:  ?Right femoral pulse 2+ palpable ?Left femoral pulse weaker and 1+ palpable ?PULMONARY: No respiratory distress ?ABDOMEN: Soft and non-tender. ?MUSCULOSKELETAL: There are no major deformities or cyanosis. ?NEUROLOGIC: No focal weakness or paresthesias are detected. ?SKIN: There are no ulcers or rashes  noted. ?PSYCHIATRIC: The patient has a normal affect. ?  ?DATA:  ?  ?ABIs today are 0.55 on the right biphasic and 0.63 on the left biphasic ?  ?Assessment/Plan: ?  ?71 year old male presents for evaluation of li

## 2021-10-16 NOTE — Progress Notes (Signed)
Pt ambulated without difficulty or bleeding.   Discharged home with wife who will drive and stay with pt x 24 hrs 

## 2021-10-17 ENCOUNTER — Encounter (HOSPITAL_COMMUNITY): Payer: Self-pay | Admitting: Vascular Surgery

## 2021-10-17 LAB — POCT ACTIVATED CLOTTING TIME: Activated Clotting Time: 185 seconds

## 2021-11-04 ENCOUNTER — Other Ambulatory Visit: Payer: Self-pay | Admitting: Physician Assistant

## 2021-11-04 DIAGNOSIS — F419 Anxiety disorder, unspecified: Secondary | ICD-10-CM

## 2021-11-04 DIAGNOSIS — J302 Other seasonal allergic rhinitis: Secondary | ICD-10-CM

## 2021-11-04 DIAGNOSIS — I6339 Cerebral infarction due to thrombosis of other cerebral artery: Secondary | ICD-10-CM

## 2021-11-04 DIAGNOSIS — G479 Sleep disorder, unspecified: Secondary | ICD-10-CM

## 2021-11-04 NOTE — Telephone Encounter (Signed)
Last appt 3/13/023 Last written 10/01/2021 #30 with no refills

## 2021-11-18 ENCOUNTER — Other Ambulatory Visit: Payer: Self-pay | Admitting: *Deleted

## 2021-11-18 DIAGNOSIS — I739 Peripheral vascular disease, unspecified: Secondary | ICD-10-CM

## 2021-11-24 ENCOUNTER — Ambulatory Visit (INDEPENDENT_AMBULATORY_CARE_PROVIDER_SITE_OTHER): Payer: Medicare HMO | Admitting: Physician Assistant

## 2021-11-24 VITALS — BP 137/57 | HR 67 | Temp 97.8°F | Ht 65.0 in | Wt 176.0 lb

## 2021-11-24 DIAGNOSIS — R062 Wheezing: Secondary | ICD-10-CM

## 2021-11-24 DIAGNOSIS — J441 Chronic obstructive pulmonary disease with (acute) exacerbation: Secondary | ICD-10-CM

## 2021-11-24 MED ORDER — PREDNISONE 20 MG PO TABS
ORAL_TABLET | ORAL | 0 refills | Status: DC
Start: 2021-11-24 — End: 2022-01-19

## 2021-11-24 MED ORDER — DOXYCYCLINE HYCLATE 100 MG PO TABS: 100.0000 mg | ORAL_TABLET | Freq: Two times a day (BID) | ORAL | 0 refills | Status: DC

## 2021-11-24 NOTE — Patient Instructions (Addendum)
Need to be using symbicort 2 puffs twice a day and then ventolin or your nebulizer(duoneb) for rescue or before bedtime.   Start prednisone and doxycycline.   Asthma, Adult  Asthma is a long-term (chronic) condition that causes recurrent episodes in which the lower airways in the lungs become tight and narrow. The narrowing is caused by inflammation and tightening of the smooth muscle around the lower airways. Asthma episodes, also called asthma attacks or asthma flares, may cause coughing, making high-pitched whistling sounds when you breathe, most often when you breathe out (wheezing), shortness of breath, and chest pain. The airways may produce extra mucus caused by the inflammation and irritation. During an attack, it can be difficult to breathe. Asthma attacks can range from minor to life-threatening. Asthma cannot be cured, but medicines and lifestyle changes can help control it and treat acute attacks. It is important to keep your asthma well controlled so the condition does not interfere with your daily life. What are the causes? This condition is believed to be caused by inherited (genetic) and environmental factors, but its exact cause is not known. What can trigger an asthma attack? Many things can bring on an asthma attack or make symptoms worse. These triggers are different for every person. Common triggers include: Allergens and irritants like mold, dust, pet dander, cockroaches, pollen, air pollution, and chemical odors. Cigarette smoke. Weather changes and cold air. Stress and strong emotional responses such as crying or laughing hard. Certain medications such as aspirin or beta blockers. Infections and inflammatory conditions, such as the flu, a cold, pneumonia, or inflammation of the nasal membranes (rhinitis). Gastroesophageal reflux disease (GERD). What are the signs or symptoms? Symptoms may occur right after exposure to an asthma trigger or hours later and can vary by  person. Common signs and symptoms include: Wheezing. Trouble breathing (shortness of breath). Excessive nighttime or early morning coughing. Chest tightness. Tiredness (fatigue) with minimal activity. Difficulty talking in complete sentences. Poor exercise tolerance. How is this diagnosed? This condition is diagnosed based on: A physical exam and your medical history. Tests, which may include: Lung function studies to evaluate the flow of air in your lungs. Allergy tests. Imaging tests, such as X-rays. How is this treated? There is no cure, but symptoms can be controlled with proper treatment. Treatment usually involves: Identifying and avoiding your asthma triggers. Inhaled medicines. Two types are commonly used to treat asthma, depending on severity: Controller medicines. These help prevent asthma symptoms from occurring. They are taken every day. Fast-acting reliever or rescue medicines. These quickly relieve asthma symptoms. They are used as needed and provide short-term relief. Using other medicines, such as: Allergy medicines, such as antihistamines, if your asthma attacks are triggered by allergens. Immune medicines (immunomodulators). These are medicines that help control the immune system. Using supplemental oxygen. This is only needed during a severe episode. Creating an asthma action plan. An asthma action plan is a written plan for managing and treating your asthma attacks. This plan includes: A list of your asthma triggers and how to avoid them. Information about when medicines should be taken and when their dosage should be changed. Instructions about using a device called a peak flow meter. A peak flow meter measures how well the lungs are working and the severity of your asthma. It helps you monitor your condition. Follow these instructions at home: Take over-the-counter and prescription medicines only as told by your health care provider. Stay up to date on all  vaccinations as recommended  by your healthcare provider, including vaccines for the flu and pneumonia. Use a peak flow meter and keep track of your peak flow readings. Understand and use your asthma action plan to address any asthma flares. Do not smoke or allow anyone to smoke in your home. Contact a health care provider if: You have wheezing, shortness of breath, or a cough that is not responding to medicines. Your medicines are causing side effects, such as a rash, itching, swelling, or trouble breathing. You need to use a reliever medicine more than 2-3 times a week. Your peak flow reading is still at 50-79% of your personal best after following your action plan for 1 hour. You have a fever and shortness of breath. Get help right away if: You are getting worse and do not respond to treatment during an asthma attack. You are short of breath when at rest or when doing very little physical activity. You have difficulty eating, drinking, or talking. You have chest pain or tightness. You develop a fast heartbeat or palpitations. You have a bluish color to your lips or fingernails. You are light-headed or dizzy, or you faint. Your peak flow reading is less than 50% of your personal best. You feel too tired to breathe normally. These symptoms may be an emergency. Get help right away. Call 911. Do not wait to see if the symptoms will go away. Do not drive yourself to the hospital. Summary Asthma is a long-term (chronic) condition that causes recurrent episodes in which the airways become tight and narrow. Asthma episodes, also called asthma attacks or asthma flares, can cause coughing, wheezing, shortness of breath, and chest pain. Asthma cannot be cured, but medicines and lifestyle changes can help keep it well controlled and prevent asthma flares. Make sure you understand how to avoid triggers and how and when to use your medicines. Asthma attacks can range from minor to life-threatening.  Get help right away if you have an asthma attack and do not respond to treatment with your usual rescue medicines. This information is not intended to replace advice given to you by your health care provider. Make sure you discuss any questions you have with your health care provider. Document Revised: 03/19/2021 Document Reviewed: 03/10/2021 Elsevier Patient Education  [place].

## 2021-11-24 NOTE — Progress Notes (Signed)
Acute Office Visit  Subjective:     Patient ID: Travis Barajas, male    DOB: [birth year], 71 y.o.   MRN: [id number]  Chief Complaint  Patient presents with   Cough    HPI Patient is in today for cough, wheezing, SOB for the last 2 weeks. Pt has hx of COPD, seasonal allergies, asthma. He is supposed to be on symbicort but admits to not taking it. He reports he is on [employer] conservation [employer]. Denies any fever, chills, body aches, sinus pressure, ear pain or sore throat. He is wheezing and feels SOB. Cough is productive but clear to white sputum. Taking mucinex and using rescue inhaler. Wheezing worse at bedtime.   .. Active Ambulatory Problems    Diagnosis Date Noted   GERD (gastroesophageal reflux disease) 07/23/2011   Allergic rhinitis 07/23/2011   Joint pain 07/23/2011   HTN (hypertension), malignant 10/01/2011   Tobacco user 12/05/2011   SOB (shortness of breath) 10/18/2012   Hematuria 11/07/2013   Hyperlipidemia 11/17/2013   COPD, moderate ([place]) 03/21/2014   Erectile dysfunction 03/21/2014   Claudication ([place]) 09/26/2014   Generalized anxiety disorder 01/15/2015   BPH without urinary obstruction 05/25/2016   Acute nonseasonal allergic rhinitis due to pollen 05/04/2017   Dyslipidemia 05/04/2017   Current smoker 09/01/2017   Hemoptysis 09/01/2017   Left carotid bruit 54/65/6812   Systolic murmur 75/17/0017   Trouble in sleeping 10/05/2018   PVD (peripheral vascular disease) with claudication ([place]) 02/22/2019   Thrombocytopenia ([place]) 02/24/2019   Slurred speech 02/24/2019   Dizziness 02/24/2019   Dysphagia 02/24/2019   TIA (transient ischemic attack) 02/24/2019   Intercostal retractions 03/31/2019   Cerebrovascular accident ([place]) 04/03/2019   Distended abdomen 04/14/2019   Primary hypertension 04/14/2019   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   Renal cyst, left 04/26/2019   Gallstone 04/26/2019   Lower extremity edema 06/15/2019   Anxiety 06/15/2019    Wheezing 07/24/2019   COPD with acute exacerbation ([place]) 07/24/2019   Left thigh pain 07/24/2019   Acute left-sided low back pain with left-sided sciatica 07/24/2019   Pulmonary nodules 07/31/2019   Change in voice 09/01/2019   Expiratory stridor 09/01/2019   Choking 09/01/2019   Abnormal CT scan, neck 09/01/2019   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   Bilateral hand swelling 01/05/2020   Plantar fasciitis 01/05/2020   Seasonal allergies 11/29/2020   Benign prostatic hyperplasia with urinary frequency 04/08/2021   Diminished pulses in lower extremity 08/26/2021   Elevated fasting glucose 08/27/2021   Moderate asthma with exacerbation 11/25/2021   Resolved Ambulatory Problems    Diagnosis Date Noted   BPH (benign prostatic hyperplasia) 11/21/2013   Pneumonia of left lower lobe due to infectious organism 04/03/2019   Past Medical History:  Diagnosis Date   Allergy    Arthritis    COPD (chronic obstructive pulmonary disease) ([place])    Hypertension    Stroke ([place])      ROS  See HPI.     Objective:    BP (!) 137/57   Pulse 67   Temp 97.8 F (36.6 C) (Oral)   Ht '5\' 5"'$  (1.651 m)   Wt 176 lb (79.8 kg)   SpO2 99%   BMI 29.29 kg/m  BP Readings from Last 3 Encounters:  11/24/21 (!) 137/57  10/16/21 (!) 160/68  10/07/21 (!) 173/77      Physical Exam Vitals reviewed.  Constitutional:      Appearance: Normal appearance.  HENT:     Head: Normocephalic.  Right Ear: Tympanic membrane, ear canal and external ear normal. There is no impacted cerumen.     Left Ear: Tympanic membrane, ear canal and external ear normal. There is no impacted cerumen.     Nose: Congestion present.     Mouth/Throat:     Mouth: Mucous membranes are moist.     Pharynx: No oropharyngeal exudate or posterior oropharyngeal erythema.  Eyes:     General:        Right eye: No discharge.        Left eye: No discharge.     Extraocular Movements: Extraocular movements intact.     Conjunctiva/sclera:  Conjunctivae normal.     Pupils: Pupils are equal, round, and reactive to light.  Cardiovascular:     Rate and Rhythm: Normal rate and regular rhythm.     Pulses: Normal pulses.  Pulmonary:     Comments: Labored breathing with wheezing in upper bilateral lung fields  Musculoskeletal:     Cervical back: Normal range of motion.  Neurological:     General: No focal deficit present.     Mental Status: He is alert.  Psychiatric:        Mood and Affect: Mood normal.      Assessment & Plan:  Marland KitchenMarland KitchenMichae Barajas was seen today for cough.  Diagnoses and all orders for this visit:  COPD with acute exacerbation ([place]) -     predniSONE (DELTASONE) 20 MG tablet; Take 3 tablets for 3 days, take 2 tablets for 3 days, take 1 tablet for 2 days, take 1/2 table for 4 days. -     doxycycline (VIBRA-TABS) 100 MG tablet; Take 1 tablet (100 mg total) by mouth 2 (two) times daily.  Wheezing -     predniSONE (DELTASONE) 20 MG tablet; Take 3 tablets for 3 days, take 2 tablets for 3 days, take 1 tablet for 2 days, take 1/2 table for 4 days.   Sounds more like asthma flare with the wheezing Prednisone and doxycycline given due to 2 week duration Make sure using symbicort 2 puffs twice a day for prevention and albuterol or duoneb before bedtime is wheezing is worse then and as needed Follow up in 1 month   Iran Planas, PA-C

## 2021-11-24 NOTE — Progress Notes (Deleted)
[facility] [place] HISTORY AND PHYSICAL   History of Present Illness:  Patient is a 71 y.o. year old male who presents for evaluation of PAD with history of  Lifestyle limiting short distance claudication bilateral lower extremities.  He underwent Aortogram with B LE runoff and Left external iliac artery angioplasty with stent placement.  He had good results post left LE intervention.  Pending on improvement with ambulation on the left LE we can discuss right LE intervention in the near future.    He is medically managed on ASA and Statin daily.    Past Medical History:  Diagnosis Date   Allergy    Anxiety    Arthritis    COPD (chronic obstructive pulmonary disease) ([place])    Hyperlipidemia    Hypertension    Stroke [facility])     Past Surgical History:  Procedure Laterality Date   ABDOMINAL AORTOGRAM W/LOWER EXTREMITY N/A 10/16/2021   Procedure: ABDOMINAL AORTOGRAM W/LOWER EXTREMITY;  Surgeon: [name], MD;  Location: [facility];  Service: Cardiovascular;  Laterality: N/A;   FOOT SURGERY     PERIPHERAL VASCULAR INTERVENTION Left 10/16/2021   Procedure: PERIPHERAL VASCULAR INTERVENTION;  Surgeon: [name], MD;  Location: [facility];  Service: Cardiovascular;  Laterality: Left;    ROS:   General:  No weight loss, Fever, chills  HEENT: No recent headaches, no nasal bleeding, no visual changes, no sore throat  Neurologic: No dizziness, blackouts, seizures. No recent symptoms of stroke or mini- stroke. No recent episodes of slurred speech, or temporary blindness.  Cardiac: No recent episodes of chest pain/pressure, no shortness of breath at rest.  No shortness of breath with exertion.  Denies history of atrial fibrillation or irregular heartbeat  Vascular: No history of rest pain in feet.  No history of claudication.  No history of non-healing ulcer, No history of DVT   Pulmonary: No home oxygen, no productive cough, no  hemoptysis,  No asthma or wheezing  Musculoskeletal:  '[ ]'$  Arthritis, '[ ]'$  Low back pain,  '[ ]'$  Joint pain  Hematologic:No history of hypercoagulable state.  No history of easy bleeding.  No history of anemia  Gastrointestinal: No hematochezia or melena,  No gastroesophageal reflux, no trouble swallowing  Urinary: '[ ]'$  chronic Kidney disease, '[ ]'$  on HD - '[ ]'$  MWF or '[ ]'$  TTHS, '[ ]'$  Burning with urination, '[ ]'$  Frequent urination, '[ ]'$  Difficulty urinating;   Skin: No rashes  Psychological: No history of anxiety,  No history of depression  Social History Social History   Tobacco Use   Smoking status: Former    Packs/day: 0.50    Years: 40.00    Total pack years: 20.00    Types: Cigarettes    Quit date: 02/15/2019    Years since quitting: 2.7   Smokeless tobacco: Never  Vaping Use   Vaping Use: Never used  Substance Use Topics   Alcohol use: Not Currently    Alcohol/week: 0.0 standard drinks of alcohol   Drug use: No    Family History Family History  Problem Relation Age of Onset   Diabetes Mother    Hypertension Mother    Alzheimer's disease Mother    CAD Mother    Heart disease Mother    Hyperlipidemia Mother    Diabetes Father    Hypertension Father    Cancer Brother    Peripheral vascular disease Brother    Cancer Daughter  Allergies  Allergies  Allergen Reactions   Cialis [Tadalafil]     Hallucinations    Lipitor [Atorvastatin]     itching   Niacin And Related Itching   Pravachol [Pravastatin] Itching    Itching.      Current Outpatient Medications  Medication Sig Dispense Refill   albuterol (VENTOLIN HFA) 108 (90 Base) MCG/ACT inhaler Inhale 2 puffs into the lungs every 6 (six) hours as needed. 16 g 1   ALPRAZolam (XANAX) 0.5 MG tablet Take 1 tablet by mouth twice daily as needed for anxiety 30 tablet 2   AMBULATORY NON FORMULARY MEDICATION Nebulizer machine and supplies for wheezing/retractions/asthma exacerbations. 1 Device 0   amLODipine (NORVASC)  10 MG tablet Take 1 tablet by mouth once daily 90 tablet 1   aspirin EC 81 MG tablet Take 1 tablet (81 mg total) by mouth daily. Swallow whole. 150 tablet 2   budesonide-formoterol (SYMBICORT) 160-4.5 MCG/ACT inhaler Inhale 2 puffs into the lungs 2 (two) times daily. 3 each 1   clopidogrel (PLAVIX) 75 MG tablet Take 1 tablet by mouth once daily 90 tablet 0   diclofenac Sodium (VOLTAREN) 1 % GEL Apply 4 g topically 4 (four) times daily. To affected joint. (Patient not taking: Reported on 10/14/2021) 100 g 11   doxazosin (CARDURA) 4 MG tablet Take 1 tablet by mouth twice daily 180 tablet 0   escitalopram (LEXAPRO) 5 MG tablet Take 1 tablet (5 mg total) by mouth daily. 30 tablet 2   furosemide (LASIX) 20 MG tablet Take 1 tablet by mouth once daily 90 tablet 0   hydrALAZINE (APRESOLINE) 25 MG tablet TAKE 1 TABLET BY MOUTH THREE TIMES DAILY 270 tablet 1   ipratropium-albuterol (DUONEB) 0.5-2.5 (3) MG/3ML SOLN Take 3 mLs by nebulization every 2 (two) hours as needed. 360 mL 0   levocetirizine (XYZAL) 5 MG tablet Take 1 tablet (5 mg total) by mouth daily. 90 tablet 3   metoprolol succinate (TOPROL-XL) 100 MG 24 hr tablet TAKE 1 TABLET BY MOUTH ONCE DAILY TAKE  WITH  OR  IMMEDIATELY  FOLLOWING  A  MEAL 90 tablet 1   montelukast (SINGULAIR) 10 MG tablet TAKE 1 TABLET BY MOUTH AT BEDTIME 90 tablet 0   Multiple Vitamins-Minerals (MULTIVITAMIN WITH MINERALS) tablet Take 1 tablet by mouth daily.     naproxen sodium (ALEVE) 220 MG tablet Take 440 mg by mouth 2 (two) times daily as needed (pain).     oxymetazoline (AFRIN SINUS) 0.05 % nasal spray Place 1 spray into both nostrils 2 (two) times daily. Only for 3 days. (Patient taking differently: Place 1 spray into both nostrils 2 (two) times daily as needed for congestion. Only for 3 days.) 30 mL 0   rosuvastatin (CRESTOR) 10 MG tablet Take 1 tablet (10 mg total) by mouth daily. 90 tablet 3   traZODone (DESYREL) 100 MG tablet TAKE 2 TABLETS BY MOUTH AT BEDTIME 180  tablet 0   trolamine salicylate (ASPERCREME) 10 % cream Apply 1 application. topically as needed for muscle pain.     No current facility-administered medications for this visit.    Physical Examination  There were no vitals filed for this visit.  There is no height or weight on file to calculate BMI.  General:  Alert and oriented, no acute distress HEENT: Normal Neck: No bruit or JVD Pulmonary: Clear to auscultation bilaterally Cardiac: Regular Rate and Rhythm without murmur Abdomen: Soft, non-tender, non-distended, no mass, no scars Skin: No rash Extremity Pulses:  2+  radial, brachial, femoral, dorsalis pedis, posterior tibial pulses bilaterally Musculoskeletal: No deformity or edema  Neurologic: Upper and lower extremity motor 5/5 and symmetric  DATA: ***   ASSESSMENT: ***   PLAN: ***   Roxy Horseman PA-C [facility] [place] Office: [phone]  MD in clinic [place]

## 2021-11-25 ENCOUNTER — Encounter (HOSPITAL_COMMUNITY): Payer: Medicare HMO

## 2021-11-25 ENCOUNTER — Encounter: Payer: Self-pay | Admitting: Physician Assistant

## 2021-11-25 DIAGNOSIS — J45901 Unspecified asthma with (acute) exacerbation: Secondary | ICD-10-CM | POA: Insufficient documentation

## 2021-12-09 ENCOUNTER — Other Ambulatory Visit: Payer: Self-pay | Admitting: Physician Assistant

## 2021-12-09 DIAGNOSIS — R600 Localized edema: Secondary | ICD-10-CM

## 2022-01-06 ENCOUNTER — Other Ambulatory Visit: Payer: Self-pay | Admitting: Physician Assistant

## 2022-01-06 DIAGNOSIS — N400 Enlarged prostate without lower urinary tract symptoms: Secondary | ICD-10-CM

## 2022-01-19 ENCOUNTER — Ambulatory Visit (INDEPENDENT_AMBULATORY_CARE_PROVIDER_SITE_OTHER): Payer: Medicare HMO | Admitting: Family Medicine

## 2022-01-19 ENCOUNTER — Encounter: Payer: Self-pay | Admitting: Family Medicine

## 2022-01-19 VITALS — BP 169/71 | HR 96 | Ht 69.0 in | Wt 183.0 lb

## 2022-01-19 DIAGNOSIS — J449 Chronic obstructive pulmonary disease, unspecified: Secondary | ICD-10-CM

## 2022-01-19 DIAGNOSIS — J189 Pneumonia, unspecified organism: Secondary | ICD-10-CM | POA: Diagnosis not present

## 2022-01-19 DIAGNOSIS — J441 Chronic obstructive pulmonary disease with (acute) exacerbation: Secondary | ICD-10-CM

## 2022-01-19 DIAGNOSIS — R062 Wheezing: Secondary | ICD-10-CM | POA: Diagnosis not present

## 2022-01-19 DIAGNOSIS — R918 Other nonspecific abnormal finding of lung field: Secondary | ICD-10-CM

## 2022-01-19 MED ORDER — BUDESONIDE-FORMOTEROL FUMARATE 160-4.5 MCG/ACT IN AERO: 2.0000 | INHALATION_SPRAY | Freq: Two times a day (BID) | RESPIRATORY_TRACT | 4 refills | Status: DC

## 2022-01-19 MED ORDER — PREDNISONE 20 MG PO TABS: 40.0000 mg | ORAL_TABLET | Freq: Every day | ORAL | 0 refills | Status: DC

## 2022-01-19 MED ORDER — IPRATROPIUM-ALBUTEROL 0.5-2.5 (3) MG/3ML IN SOLN: 3.0000 mL | Freq: Four times a day (QID) | RESPIRATORY_TRACT | 1 refills | Status: DC | PRN

## 2022-01-19 MED ORDER — METHYLPREDNISOLONE SODIUM SUCC 125 MG IJ SOLR
125.0000 mg | Freq: Once | INTRAMUSCULAR | Status: AC
  Administered 2022-01-19: 125 mg via INTRAMUSCULAR

## 2022-01-19 MED ORDER — IPRATROPIUM-ALBUTEROL 0.5-2.5 (3) MG/3ML IN SOLN
3.0000 mL | Freq: Once | RESPIRATORY_TRACT | Status: AC
  Administered 2022-01-19: 3 mL via RESPIRATORY_TRACT

## 2022-01-19 MED ORDER — AMOXICILLIN-POT CLAVULANATE 875-125 MG PO TABS: 1.0000 | ORAL_TABLET | Freq: Two times a day (BID) | ORAL | 0 refills | Status: DC

## 2022-01-19 NOTE — Assessment & Plan Note (Signed)
Due for 2-year follow-up CT.

## 2022-01-19 NOTE — Assessment & Plan Note (Addendum)
Acute COPD exacerbation.  Sounds like there may have been a trigger from poor air quality but no other viral symptoms.  We discussed treatment with albuterol neb here in the office as well as Solu-Medrol.  He feels like this was a worse exacerbation than his last 1.  Also given a prescription for Augmentin and prednisone.  He started having a little bit of discomfort in his back lower rib area on the right side while he was here in the office.  Encouraged him to give [place] a call over the next couple days if it persist or if he feels like it is getting worse.  Him to schedule follow-up in 2 weeks for recheck.  Encouraged him to get back on his Symbicort.  New prescription sent to pharmacy this should hopefully reduce his flares since he has had 2 in the last 3 months.

## 2022-01-19 NOTE — Progress Notes (Signed)
Acute Office Visit  Subjective:     Patient ID: Travis Barajas, male    DOB: 15-Jan-1951, 71 y.o.   MRN: [id number]  Chief Complaint  Patient presents with   Shortness of Breath    Pt c/o SOB onset  for 1 week    HPI Patient is in today for SOB x 1 weeks.  Says has noticed will get more SOB with activity.  No fever or chills.  He is definitely been coughing more but it has been mostly dry which is some occasional sputum.  No lower extremity increased swelling.  He always has a little bit of chronic swelling.  No sinus symptoms.  He says he thinks it may have started when he was sitting outside with some of the smoke from [place].  No other known triggers.  Also been coughing in the middle of the night.  Last COPD exacerbation was about 5 months ago.  He has not been using his Symbicort regularly.  He does need a refill on that medication.  ROS      Objective:    BP (!) 169/71   Pulse 96   Ht '5\' 9"'$  (1.753 m)   Wt 183 lb (83 kg)   SpO2 98%   BMI 27.02 kg/m    Physical Exam Constitutional:      Appearance: He is well-developed.  HENT:     Head: Normocephalic and atraumatic.  Cardiovascular:     Rate and Rhythm: Normal rate and regular rhythm.     Heart sounds: Normal heart sounds.  Pulmonary:     Effort: Pulmonary effort is normal.     Breath sounds: Normal breath sounds.  Skin:    General: Skin is warm and dry.  Neurological:     Mental Status: He is alert and oriented to person, place, and time.  Psychiatric:        Behavior: Behavior normal.     No results found for any visits on 01/19/22.      Assessment & Plan:   Problem List Items Addressed This Visit       Respiratory   COPD, moderate (HCC) (Chronic)   Relevant Medications   predniSONE (DELTASONE) 20 MG tablet   budesonide-formoterol (SYMBICORT) 160-4.5 MCG/ACT inhaler   ipratropium-albuterol (DUONEB) 0.5-2.5 (3) MG/3ML SOLN   Pulmonary nodules    Due for 2-year follow-up CT.       Relevant Orders   CT Chest Wo Contrast   COPD with acute exacerbation (HCC)    Acute COPD exacerbation.  Sounds like there may have been a trigger from poor air quality but no other viral symptoms.  We discussed treatment with albuterol neb here in the office as well as Solu-Medrol.  He feels like this was a worse exacerbation than his last 1.  Also given a prescription for Augmentin and prednisone.  He started having a little bit of discomfort in his back lower rib area on the right side while he was here in the office.  Encouraged him to give [place] a call over the next couple days if it persist or if he feels like it is getting worse.  Him to schedule follow-up in 2 weeks for recheck.  Encouraged him to get back on his Symbicort.  New prescription sent to pharmacy this should hopefully reduce his flares since he has had 2 in the last 3 months.      Relevant Medications   predniSONE (DELTASONE) 20 MG tablet   budesonide-formoterol (  SYMBICORT) 160-4.5 MCG/ACT inhaler   ipratropium-albuterol (DUONEB) 0.5-2.5 (3) MG/3ML SOLN     Other   Wheezing   Relevant Medications   budesonide-formoterol (SYMBICORT) 160-4.5 MCG/ACT inhaler   Other Visit Diagnoses     COPD exacerbation ([place])    -  Primary   Relevant Medications   methylPREDNISolone sodium succinate (SOLU-MEDROL) 125 mg/2 mL injection 125 mg (Completed)   ipratropium-albuterol (DUONEB) 0.5-2.5 (3) MG/3ML nebulizer solution 3 mL (Completed)   predniSONE (DELTASONE) 20 MG tablet   budesonide-formoterol (SYMBICORT) 160-4.5 MCG/ACT inhaler   ipratropium-albuterol (DUONEB) 0.5-2.5 (3) MG/3ML SOLN   Abnormal findings on diagnostic imaging of lung       Relevant Orders   CT Chest Wo Contrast   Pneumonia of left lower lobe due to infectious organism       Relevant Medications   ipratropium-albuterol (DUONEB) 0.5-2.5 (3) MG/3ML nebulizer solution 3 mL (Completed)   amoxicillin-clavulanate (AUGMENTIN) 875-125 MG tablet   budesonide-formoterol  (SYMBICORT) 160-4.5 MCG/ACT inhaler   ipratropium-albuterol (DUONEB) 0.5-2.5 (3) MG/3ML SOLN       Meds ordered this encounter  Medications   methylPREDNISolone sodium succinate (SOLU-MEDROL) 125 mg/2 mL injection 125 mg   ipratropium-albuterol (DUONEB) 0.5-2.5 (3) MG/3ML nebulizer solution 3 mL   predniSONE (DELTASONE) 20 MG tablet    Sig: Take 2 tablets (40 mg total) by mouth daily with breakfast.    Dispense:  10 tablet    Refill:  0   amoxicillin-clavulanate (AUGMENTIN) 875-125 MG tablet    Sig: Take 1 tablet by mouth 2 (two) times daily.    Dispense:  14 tablet    Refill:  0   budesonide-formoterol (SYMBICORT) 160-4.5 MCG/ACT inhaler    Sig: Inhale 2 puffs into the lungs 2 (two) times daily.    Dispense:  1 each    Refill:  4   ipratropium-albuterol (DUONEB) 0.5-2.5 (3) MG/3ML SOLN    Sig: Take 3 mLs by nebulization every 6 (six) hours as needed.    Dispense:  90 mL    Refill:  1    No follow-ups on file.  Beatrice Lecher, MD

## 2022-01-20 ENCOUNTER — Encounter (HOSPITAL_COMMUNITY): Payer: Medicare HMO

## 2022-01-20 ENCOUNTER — Encounter: Payer: Medicare HMO | Admitting: Vascular Surgery

## 2022-01-27 ENCOUNTER — Other Ambulatory Visit: Payer: Self-pay | Admitting: Physician Assistant

## 2022-01-27 ENCOUNTER — Ambulatory Visit (INDEPENDENT_AMBULATORY_CARE_PROVIDER_SITE_OTHER): Payer: Medicare HMO

## 2022-01-27 DIAGNOSIS — J439 Emphysema, unspecified: Secondary | ICD-10-CM | POA: Diagnosis not present

## 2022-01-27 DIAGNOSIS — R918 Other nonspecific abnormal finding of lung field: Secondary | ICD-10-CM | POA: Diagnosis not present

## 2022-01-27 DIAGNOSIS — I10 Essential (primary) hypertension: Secondary | ICD-10-CM

## 2022-01-27 DIAGNOSIS — Z8673 Personal history of transient ischemic attack (TIA), and cerebral infarction without residual deficits: Secondary | ICD-10-CM

## 2022-01-30 ENCOUNTER — Other Ambulatory Visit: Payer: Self-pay | Admitting: Physician Assistant

## 2022-01-30 NOTE — Progress Notes (Signed)
HI Bland Span, your CT scan of your chest shows the nodules in your check are stable.  It also shows your COPD.  There is also significant plaque buildup around your heart and on one of your heart valves so I would ike you to f/u with Jade to see if any additional workup or modification to meds or lifestyle needs to be addressed.

## 2022-02-02 ENCOUNTER — Encounter: Payer: Self-pay | Admitting: Physician Assistant

## 2022-02-02 ENCOUNTER — Ambulatory Visit (INDEPENDENT_AMBULATORY_CARE_PROVIDER_SITE_OTHER): Payer: Medicare HMO | Admitting: Physician Assistant

## 2022-02-02 VITALS — BP 141/60 | HR 71 | Ht 69.0 in | Wt 177.0 lb

## 2022-02-02 DIAGNOSIS — Z79899 Other long term (current) drug therapy: Secondary | ICD-10-CM

## 2022-02-02 DIAGNOSIS — J449 Chronic obstructive pulmonary disease, unspecified: Secondary | ICD-10-CM

## 2022-02-02 DIAGNOSIS — I739 Peripheral vascular disease, unspecified: Secondary | ICD-10-CM

## 2022-02-02 DIAGNOSIS — I700 Atherosclerosis of aorta: Secondary | ICD-10-CM

## 2022-02-02 DIAGNOSIS — I2510 Atherosclerotic heart disease of native coronary artery without angina pectoris: Secondary | ICD-10-CM | POA: Diagnosis not present

## 2022-02-02 DIAGNOSIS — E782 Mixed hyperlipidemia: Secondary | ICD-10-CM | POA: Diagnosis not present

## 2022-02-02 NOTE — Patient Instructions (Signed)
Will may referral to Dr. Sophronia Simas Get labs

## 2022-02-02 NOTE — Progress Notes (Unsigned)
Established Patient Office Visit  Subjective   Patient ID: Travis Barajas, male    DOB: 20-May-1951  Age: 71 y.o. MRN: [id number]  Chief Complaint  Patient presents with  . Follow-up    HPI Pt is a 71 yo male with COPD, hx of CVA, CAD, PVD, HLD who presents to the clinic for 2 week follow up.  He is accompanied by his wife.   Appt with Dr. Madilyn Fireman for COPD exacerbation was on 01/19/2022. He was given solumedrol/augmentin/prednisone. He was not taking symbicort and that was restarted. He is feeling much better. No significant SOB. CT of chest ordered at last visit that he wants to go over.   IMPRESSION: 1. All previously noted small pulmonary nodules are stable in size and number measuring 6 mm or less in size, considered definitively benign at this time. No future imaging follow-up is recommended. 2. Mild diffuse bronchial wall thickening with very mild centrilobular and paraseptal emphysema; imaging findings suggestive of underlying COPD. 3. Aortic atherosclerosis, in addition to left main and three-vessel coronary artery disease. Assessment for potential risk factor modification, dietary therapy or pharmacologic therapy may be warranted, if clinically indicated. 4. There are calcifications of the aortic valve. Echocardiographic correlation for evaluation of potential valvular dysfunction may be warranted if clinically indicated.   Aortic Atherosclerosis (ICD10-I70.0) and Emphysema (ICD10-J43.9).    .. Active Ambulatory Problems    Diagnosis Date Noted  . GERD (gastroesophageal reflux disease) 07/23/2011  . Allergic rhinitis 07/23/2011  . Joint pain 07/23/2011  . HTN (hypertension), malignant 10/01/2011  . Tobacco user 12/05/2011  . Hematuria 11/07/2013  . Hyperlipidemia 11/17/2013  . COPD, moderate ([place]) 03/21/2014  . Erectile dysfunction 03/21/2014  . Claudication ([place]) 09/26/2014  . Generalized anxiety disorder 01/15/2015  . BPH without urinary obstruction  05/25/2016  . Acute nonseasonal allergic rhinitis due to pollen 05/04/2017  . Dyslipidemia 05/04/2017  . Current smoker 09/01/2017  . Hemoptysis 09/01/2017  . Left carotid bruit 09/01/2017  . Systolic murmur 26/94/8546  . Trouble in sleeping 10/05/2018  . PVD (peripheral vascular disease) with claudication ([place]) 02/22/2019  . Thrombocytopenia ([place]) 02/24/2019  . Slurred speech 02/24/2019  . Dizziness 02/24/2019  . Dysphagia 02/24/2019  . TIA (transient ischemic attack) 02/24/2019  . Intercostal retractions 03/31/2019  . Cerebrovascular accident ([place]) 04/03/2019  . Distended abdomen 04/14/2019  . Primary hypertension 04/14/2019  . 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 Renal cyst, left 04/26/2019  . Gallstone 04/26/2019  . Lower extremity edema 06/15/2019  . Anxiety 06/15/2019  . Wheezing 07/24/2019  . COPD with acute exacerbation ([place]) 07/24/2019  . Left thigh pain 07/24/2019  . Acute left-sided low back pain with left-sided sciatica 07/24/2019  . Pulmonary nodules 07/31/2019  . Change in voice 09/01/2019  . Expiratory stridor 09/01/2019  . Choking 09/01/2019  . Abnormal CT scan, neck 09/01/2019  . 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 Bilateral hand swelling 01/05/2020  . Plantar fasciitis 01/05/2020  . Seasonal allergies 11/29/2020  . Benign prostatic hyperplasia with urinary frequency 04/08/2021  . Diminished pulses in lower extremity 08/26/2021  . Elevated fasting glucose 08/27/2021  . Moderate asthma with exacerbation 11/25/2021  . Aortic atherosclerosis ([place]) 02/02/2022  . Coronary artery disease involving native heart without angina pectoris 02/02/2022   Resolved Ambulatory Problems    Diagnosis Date Noted  . SOB (shortness of breath) 10/18/2012  . BPH (benign prostatic hyperplasia) 11/21/2013  . Pneumonia of left lower lobe due to infectious organism 04/03/2019   Past Medical  History:  Diagnosis Date  . Allergy   . Arthritis   . COPD (chronic  obstructive pulmonary disease) ([place])   . Hypertension   . Stroke ([place])      ROS See HPI.    Objective:     BP (!) 141/60   Pulse 71   Ht '5\' 9"'$  (1.753 m)   Wt 177 lb (80.3 kg)   SpO2 99%   BMI 26.14 kg/m  BP Readings from Last 3 Encounters:  02/02/22 (!) 141/60  01/19/22 (!) 169/71  11/24/21 (!) 137/57   Wt Readings from Last 3 Encounters:  02/02/22 177 lb (80.3 kg)  01/19/22 183 lb (83 kg)  11/24/21 176 lb (79.8 kg)      Physical Exam Constitutional:      Appearance: Normal appearance.  HENT:     Head: Normocephalic.  Cardiovascular:     Rate and Rhythm: Normal rate and regular rhythm.     Pulses: Normal pulses.  Pulmonary:     Effort: Pulmonary effort is normal.     Breath sounds: Normal breath sounds.  Musculoskeletal:     Right lower leg: No edema.     Left lower leg: No edema.  Neurological:     General: No focal deficit present.     Mental Status: He is alert and oriented to person, place, and time.  Psychiatric:        Mood and Affect: Mood normal.         Assessment & Plan:  Marland KitchenMarland KitchenYisroel Barajas was seen today for follow-up.  Diagnoses and all orders for this visit:  Aortic atherosclerosis ([place]) -     Lipid Panel w/reflex Direct LDL -     Ambulatory referral to Cardiology -     US Carotid Bilateral; Future  COPD, moderate ([place]) -     COMPLETE METABOLIC PANEL WITH GFR  Coronary artery disease involving native heart without angina pectoris, unspecified vessel or lesion type -     Lipid Panel w/reflex Direct LDL -     Ambulatory referral to Cardiology -     US Carotid Bilateral; Future  Medication management -     Lipid Panel w/reflex Direct LDL -     COMPLETE METABOLIC PANEL WITH GFR  Mixed hyperlipidemia -     Ambulatory referral to Cardiology -     US Carotid Bilateral; Future  PVD (peripheral vascular disease) with claudication ([place]) -     Ambulatory referral to Cardiology -     US Carotid Bilateral; Future   Reassuring COPD  exacerbation has resolved Stay on symbicort Reminded to get high dose flu shot at pharmacy  CT results do show some possible CAD plaque accumulation Continue on crestor and discussed importance Incresed to '20mg'$  daily Referral to cardiology made to see if any other follow up scans should be done [place] of carotids ordered to look for any risk factors for stroke    Iran Planas, PA-C

## 2022-02-05 DIAGNOSIS — Z79899 Other long term (current) drug therapy: Secondary | ICD-10-CM | POA: Diagnosis not present

## 2022-02-05 DIAGNOSIS — I700 Atherosclerosis of aorta: Secondary | ICD-10-CM | POA: Diagnosis not present

## 2022-02-05 DIAGNOSIS — J449 Chronic obstructive pulmonary disease, unspecified: Secondary | ICD-10-CM | POA: Diagnosis not present

## 2022-02-05 DIAGNOSIS — I2510 Atherosclerotic heart disease of native coronary artery without angina pectoris: Secondary | ICD-10-CM | POA: Diagnosis not present

## 2022-02-06 ENCOUNTER — Encounter: Payer: Self-pay | Admitting: Physician Assistant

## 2022-02-06 ENCOUNTER — Other Ambulatory Visit: Payer: Self-pay | Admitting: Physician Assistant

## 2022-02-06 LAB — COMPLETE METABOLIC PANEL WITH GFR
AG Ratio: 1.6 (calc) (ref 1.0–2.5)
ALT: 10 U/L (ref 9–46)
AST: 14 U/L (ref 10–35)
Albumin: 4.1 g/dL (ref 3.6–5.1)
Alkaline phosphatase (APISO): 72 U/L (ref 35–144)
BUN: 9 mg/dL (ref 7–25)
CO2: 29 mmol/L (ref 20–32)
Calcium: 9.3 mg/dL (ref 8.6–10.3)
Chloride: 103 mmol/L (ref 98–110)
Creat: 1 mg/dL (ref 0.70–1.28)
Globulin: 2.5 g/dL (calc) (ref 1.9–3.7)
Glucose, Bld: 80 mg/dL (ref 65–99)
Potassium: 3.7 mmol/L (ref 3.5–5.3)
Sodium: 143 mmol/L (ref 135–146)
Total Bilirubin: 0.7 mg/dL (ref 0.2–1.2)
Total Protein: 6.6 g/dL (ref 6.1–8.1)
eGFR: 81 mL/min/{1.73_m2} (ref >=60)

## 2022-02-06 LAB — LIPID PANEL W/REFLEX DIRECT LDL
Cholesterol: 185 mg/dL (ref <200)
HDL: 52 mg/dL (ref >=40)
LDL Cholesterol (Calc): 113 mg/dL (calc) — ABNORMAL HIGH
Non-HDL Cholesterol (Calc): 133 mg/dL (calc) — ABNORMAL HIGH (ref <130)
Total CHOL/HDL Ratio: 3.6 (calc) (ref <5.0)
Triglycerides: 92 mg/dL (ref <150)

## 2022-02-06 MED ORDER — ROSUVASTATIN CALCIUM 20 MG PO TABS: 20.0000 mg | ORAL_TABLET | Freq: Every day | ORAL | 3 refills | Status: DC

## 2022-02-06 NOTE — Progress Notes (Signed)
Bland Span,   Kidney, liver, glucose look great.  Cholesterol looks MUCH better but still not to goal.  Need to increase to crestor '20mg'$  daily. I will send to pharmacy.

## 2022-02-10 DIAGNOSIS — I10 Essential (primary) hypertension: Secondary | ICD-10-CM | POA: Diagnosis not present

## 2022-02-10 DIAGNOSIS — E785 Hyperlipidemia, unspecified: Secondary | ICD-10-CM | POA: Diagnosis not present

## 2022-02-10 DIAGNOSIS — Z8673 Personal history of transient ischemic attack (TIA), and cerebral infarction without residual deficits: Secondary | ICD-10-CM | POA: Diagnosis not present

## 2022-02-10 DIAGNOSIS — I351 Nonrheumatic aortic (valve) insufficiency: Secondary | ICD-10-CM | POA: Diagnosis not present

## 2022-02-10 DIAGNOSIS — R972 Elevated prostate specific antigen [PSA]: Secondary | ICD-10-CM | POA: Diagnosis not present

## 2022-02-10 DIAGNOSIS — R002 Palpitations: Secondary | ICD-10-CM | POA: Diagnosis not present

## 2022-02-13 ENCOUNTER — Encounter: Payer: Self-pay | Admitting: Physician Assistant

## 2022-02-13 ENCOUNTER — Ambulatory Visit (INDEPENDENT_AMBULATORY_CARE_PROVIDER_SITE_OTHER): Payer: Medicare HMO | Admitting: Physician Assistant

## 2022-02-13 VITALS — BP 149/60 | HR 86 | Temp 98.6°F | Ht 69.0 in | Wt 185.0 lb

## 2022-02-13 DIAGNOSIS — J441 Chronic obstructive pulmonary disease with (acute) exacerbation: Secondary | ICD-10-CM

## 2022-02-13 DIAGNOSIS — R062 Wheezing: Secondary | ICD-10-CM | POA: Diagnosis not present

## 2022-02-13 DIAGNOSIS — J449 Chronic obstructive pulmonary disease, unspecified: Secondary | ICD-10-CM | POA: Diagnosis not present

## 2022-02-13 MED ORDER — PREDNISONE 20 MG PO TABS: ORAL_TABLET | ORAL | 0 refills | Status: DC

## 2022-02-13 MED ORDER — IPRATROPIUM-ALBUTEROL 0.5-2.5 (3) MG/3ML IN SOLN
3.0000 mL | Freq: Once | RESPIRATORY_TRACT | Status: AC
  Administered 2022-02-13: 3 mL via RESPIRATORY_TRACT

## 2022-02-13 MED ORDER — AMBULATORY NON FORMULARY MEDICATION: 0 refills | Status: AC

## 2022-02-13 MED ORDER — AZITHROMYCIN 250 MG PO TABS: ORAL_TABLET | ORAL | 0 refills | Status: DC

## 2022-02-13 MED ORDER — NAPROXEN SODIUM 220 MG PO TABS
440.0000 mg | ORAL_TABLET | Freq: Two times a day (BID) | ORAL | 5 refills | Status: DC | PRN
Start: 2022-02-13 — End: 2022-09-01

## 2022-02-13 NOTE — Patient Instructions (Signed)
Make sure taking symbicort 2 puffs twice a day Use nebulizer every 4-6 hours as needed for cough and shortness of breath and wheezing Start prednisone and zpak

## 2022-02-13 NOTE — Progress Notes (Unsigned)
Acute Office Visit  Subjective:     Patient ID: Travis Barajas, male    DOB: 17-Aug-1950, 71 y.o.   MRN: [id number]  Chief Complaint  Patient presents with   Wheezing    HPI Patient is in today for COPD exacerbation with wheezing and coughing. No fever, chills, or body aches. His last exacerbation was early August and treated with prednisone and augmentin. He is not smoking. He did start symbicort but only using one puff once a day. He also never got nebulizer machine at last visit.   .. Active Ambulatory Problems    Diagnosis Date Noted   GERD (gastroesophageal reflux disease) 07/23/2011   Allergic rhinitis 07/23/2011   Joint pain 07/23/2011   HTN (hypertension), malignant 10/01/2011   Tobacco user 12/05/2011   Hematuria 11/07/2013   Hyperlipidemia 11/17/2013   COPD, moderate ([place]) 03/21/2014   Erectile dysfunction 03/21/2014   Claudication ([place]) 09/26/2014   Generalized anxiety disorder 01/15/2015   BPH without urinary obstruction 05/25/2016   Acute nonseasonal allergic rhinitis due to pollen 05/04/2017   Dyslipidemia 05/04/2017   Current smoker 09/01/2017   Hemoptysis 09/01/2017   Left carotid bruit 41/74/0814   Systolic murmur 48/18/5631   Trouble in sleeping 10/05/2018   PVD (peripheral vascular disease) with claudication ([place]) 02/22/2019   Thrombocytopenia ([place]) 02/24/2019   Slurred speech 02/24/2019   Dizziness 02/24/2019   Dysphagia 02/24/2019   TIA (transient ischemic attack) 02/24/2019   Intercostal retractions 03/31/2019   Cerebrovascular accident ([place]) 04/03/2019   Distended abdomen 04/14/2019   Primary hypertension 04/14/2019   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/26/2019   Renal cyst, left 04/26/2019   Gallstone 04/26/2019   Lower extremity edema 06/15/2019   Anxiety 06/15/2019   Wheezing 07/24/2019   COPD with acute exacerbation ([place]) 07/24/2019   Left thigh pain 07/24/2019   Acute left-sided low back pain with left-sided sciatica 07/24/2019    Pulmonary nodules 07/31/2019   Change in voice 09/01/2019   Expiratory stridor 09/01/2019   Choking 09/01/2019   Abnormal CT scan, neck 09/01/2019   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   Bilateral hand swelling 01/05/2020   Plantar fasciitis 01/05/2020   Seasonal allergies 11/29/2020   Benign prostatic hyperplasia with urinary frequency 04/08/2021   Diminished pulses in lower extremity 08/26/2021   Elevated fasting glucose 08/27/2021   Moderate asthma with exacerbation 11/25/2021   Aortic atherosclerosis ([place]) 02/02/2022   Coronary artery disease involving native heart without angina pectoris 02/02/2022   Resolved Ambulatory Problems    Diagnosis Date Noted   SOB (shortness of breath) 10/18/2012   BPH (benign prostatic hyperplasia) 11/21/2013   Pneumonia of left lower lobe due to infectious organism 04/03/2019   Past Medical History:  Diagnosis Date   Allergy    Arthritis    COPD (chronic obstructive pulmonary disease) ([place])    Hypertension    Stroke ([place])      ROS See HPI.      Objective:    BP (!) 149/60   Pulse 86   Temp 98.6 F (37 C) ([place])   Ht '5\' 9"'$  (1.753 m)   Wt 185 lb (83.9 kg)   SpO2 98%   BMI 27.32 kg/m  BP Readings from Last 3 Encounters:  02/13/22 (!) 149/60  02/02/22 (!) 141/60  01/19/22 (!) 169/71   Wt Readings from Last 3 Encounters:  02/13/22 185 lb (83.9 kg)  02/02/22 177 lb (80.3 kg)  01/19/22 183 lb (83 kg)      Physical Exam Constitutional:  Appearance: Normal appearance.  HENT:     Head: Normocephalic.  Neck:     Vascular: No carotid bruit.  Cardiovascular:     Rate and Rhythm: Normal rate and regular rhythm.     Pulses: Normal pulses.  Pulmonary:     Breath sounds: No wheezing.     Comments: Labored breathing with bilateral lung wheezing throughout both lungs.  Musculoskeletal:     Cervical back: No tenderness.     Right lower leg: No edema.     Left lower leg: No edema.  Lymphadenopathy:     Cervical: No cervical  adenopathy.  Neurological:     General: No focal deficit present.     Mental Status: He is alert and oriented to person, place, and time.  Psychiatric:        Mood and Affect: Mood normal.        Assessment & Plan:  Marland KitchenMarland KitchenRobel Wuertz was seen today for wheezing.  Diagnoses and all orders for this visit:  COPD exacerbation ([place]) -     azithromycin (ZITHROMAX Z-PAK) 250 MG tablet; Take 2 tablets (500 mg) on  Day 1,  followed by 1 tablet (250 mg) once daily on Days 2 through 5. -     predniSONE (DELTASONE) 20 MG tablet; Take 3 tablets for 3 days, take 2 tablets for 3 days, take 1 tablet for 3 days, take 1/2 tablet for 4 days.  Wheezing -     AMBULATORY NON FORMULARY MEDICATION; Nebulizer machine and supplies for wheezing/retractions/asthma exacerbations. -     ipratropium-albuterol (DUONEB) 0.5-2.5 (3) MG/3ML nebulizer solution 3 mL  COPD, moderate ([facility]) -     AMBULATORY NON FORMULARY MEDICATION; Nebulizer machine and supplies for wheezing/retractions/asthma exacerbations.  Other orders -     naproxen sodium (ALEVE) 220 MG tablet; Take 2 tablets (440 mg total) by mouth 2 (two) times daily as needed (pain).   COPD exacerbation today, pt has not been using Symbicort twice a day but only once a day Discussed how to properly use symbicort Breathing treatment given in office with improvement of symptoms Reordered nebulizer to use as home Vitals are stable Start zpak and prednisone Follow up as needed or if symptoms worsen    Iran Planas, PA-C

## 2022-02-16 ENCOUNTER — Other Ambulatory Visit: Payer: Self-pay | Admitting: Physician Assistant

## 2022-02-16 DIAGNOSIS — I6339 Cerebral infarction due to thrombosis of other cerebral artery: Secondary | ICD-10-CM

## 2022-02-16 DIAGNOSIS — F419 Anxiety disorder, unspecified: Secondary | ICD-10-CM

## 2022-02-17 NOTE — Telephone Encounter (Signed)
Last written 11/04/2021 #30 with 2 refills

## 2022-02-27 ENCOUNTER — Other Ambulatory Visit: Payer: Self-pay | Admitting: Physician Assistant

## 2022-02-27 DIAGNOSIS — J302 Other seasonal allergic rhinitis: Secondary | ICD-10-CM

## 2022-03-10 ENCOUNTER — Ambulatory Visit (INDEPENDENT_AMBULATORY_CARE_PROVIDER_SITE_OTHER): Payer: Medicare HMO | Admitting: Physician Assistant

## 2022-03-10 ENCOUNTER — Ambulatory Visit (INDEPENDENT_AMBULATORY_CARE_PROVIDER_SITE_OTHER)
Admission: RE | Admit: 2022-03-10 | Discharge: 2022-03-10 | Disposition: A | Discharge status: Home / Self Care | Payer: Medicare HMO | Source: Ambulatory Visit | Attending: Vascular Surgery | Admitting: Vascular Surgery

## 2022-03-10 ENCOUNTER — Ambulatory Visit (HOSPITAL_COMMUNITY)
Admission: RE | Admit: 2022-03-10 | Discharge: 2022-03-10 | Disposition: A | Discharge status: Home / Self Care | Payer: Medicare HMO | Source: Ambulatory Visit | Attending: Vascular Surgery | Admitting: Vascular Surgery

## 2022-03-10 ENCOUNTER — Other Ambulatory Visit: Payer: Self-pay | Admitting: Physician Assistant

## 2022-03-10 VITALS — BP 149/78 | HR 103 | Temp 98.0°F | Resp 20 | Ht 69.0 in | Wt 179.9 lb

## 2022-03-10 DIAGNOSIS — I739 Peripheral vascular disease, unspecified: Secondary | ICD-10-CM | POA: Diagnosis not present

## 2022-03-10 DIAGNOSIS — G479 Sleep disorder, unspecified: Secondary | ICD-10-CM

## 2022-03-10 NOTE — Progress Notes (Signed)
Office Note     CC:  follow up Requesting Provider:  Donella Stade, PA-C  HPI: Travis Barajas is a 71 y.o. (1950-09-09) male who presents for surveillance of PAD.  He underwent left external iliac artery stenting by Dr. Carlis Abbott on 10/16/2021 due to lifestyle limiting claudication of the left lower extremity.  Patient states claudication symptoms of the left leg have completely resolved postoperatively.  Patient does have claudication of the right leg however it is not lifestyle limiting.  Based on angiography he does have a long segment SFA occlusion.  He denies any rest pain or tissue loss of the right leg.  He is on aspirin, Plavix, statin daily.  He is a former smoker.   Past Medical History:  Diagnosis Date   Allergy    Anxiety    Arthritis    COPD (chronic obstructive pulmonary disease) ([place])    Hyperlipidemia    Hypertension    Stroke [facility])     Past Surgical History:  Procedure Laterality Date   ABDOMINAL AORTOGRAM W/LOWER EXTREMITY N/A 10/16/2021   Procedure: ABDOMINAL AORTOGRAM W/LOWER EXTREMITY;  Surgeon: [name], MD;  Location: [place] [facility];  Service: Cardiovascular;  Laterality: N/A;   FOOT SURGERY     PERIPHERAL VASCULAR INTERVENTION Left 10/16/2021   Procedure: PERIPHERAL VASCULAR INTERVENTION;  Surgeon: [name], MD;  Location: [place] [facility];  Service: Cardiovascular;  Laterality: Left;    Social History   Socioeconomic History   Marital status: Married    Spouse name: Hattie   Number of children: 7   Years of education: 14   Highest education level: Some college, no degree  Occupational History    Comment: Retired  Tobacco Use   Smoking status: Former    Packs/day: 0.50    Years: 40.00    Total pack years: 20.00    Types: Cigarettes    Quit date: 02/15/2019    Years since quitting: 3.0    Passive exposure: Never   Smokeless tobacco: Never  Vaping Use   Vaping Use: Never used  Substance and Sexual Activity   Alcohol  use: Not Currently    Alcohol/week: 0.0 standard drinks of alcohol   Drug use: No   Sexual activity: Yes  Other Topics Concern   Not on file  Social History Narrative   Not on file   Social Determinants of Health   Financial Resource Strain: Not on file  Food Insecurity: Not on file  Transportation Needs: Not on file  Physical Activity: Not on file  Stress: Not on file  Social Connections: Not on file  Intimate Partner Violence: Not on file    Family History  Problem Relation Age of Onset   Diabetes Mother    Hypertension Mother    Alzheimer's disease Mother    CAD Mother    Heart disease Mother    Hyperlipidemia Mother    Diabetes Father    Hypertension Father    Cancer Brother    Peripheral vascular disease Brother    Cancer Daughter     Current Outpatient Medications  Medication Sig Dispense Refill   albuterol (VENTOLIN HFA) 108 (90 Base) MCG/ACT inhaler Inhale 2 puffs into the lungs every 6 (six) hours as needed. 16 g 1   ALPRAZolam (XANAX) 0.5 MG tablet Take 1 tablet by mouth twice daily as needed for anxiety 30 tablet 5   AMBULATORY NON FORMULARY MEDICATION Nebulizer machine and supplies for wheezing/retractions/asthma exacerbations. 1 Device 0  amLODipine (NORVASC) 10 MG tablet Take 1 tablet by mouth once daily 90 tablet 1   aspirin EC 81 MG tablet Take 1 tablet (81 mg total) by mouth daily. Swallow whole. 150 tablet 2   azithromycin (ZITHROMAX Z-PAK) 250 MG tablet Take 2 tablets (500 mg) on  Day 1,  followed by 1 tablet (250 mg) once daily on Days 2 through 5. 6 tablet 0   budesonide-formoterol (SYMBICORT) 160-4.5 MCG/ACT inhaler Inhale 2 puffs into the lungs 2 (two) times daily. 1 each 4   clopidogrel (PLAVIX) 75 MG tablet Take 1 tablet by mouth once daily 90 tablet 0   diclofenac Sodium (VOLTAREN) 1 % GEL Apply 4 g topically 4 (four) times daily. To affected joint. 100 g 11   doxazosin (CARDURA) 4 MG tablet Take 1 tablet by mouth twice daily 180 tablet 0    escitalopram (LEXAPRO) 5 MG tablet Take 1 tablet by mouth once daily 90 tablet 0   furosemide (LASIX) 20 MG tablet Take 1 tablet by mouth once daily 90 tablet 0   hydrALAZINE (APRESOLINE) 25 MG tablet TAKE 1 TABLET BY MOUTH THREE TIMES DAILY 270 tablet 1   ipratropium-albuterol (DUONEB) 0.5-2.5 (3) MG/3ML SOLN Take 3 mLs by nebulization every 6 (six) hours as needed. 90 mL 1   levocetirizine (XYZAL) 5 MG tablet Take 1 tablet (5 mg total) by mouth daily. 90 tablet 3   metoprolol succinate (TOPROL-XL) 100 MG 24 hr tablet TAKE 1 TABLET BY MOUTH ONCE DAILY TAKE  WITH  OR  IMMEDIATELY  FOLLOWING  A  MEAL 90 tablet 1   montelukast (SINGULAIR) 10 MG tablet TAKE 1 TABLET BY MOUTH AT BEDTIME 90 tablet 3   Multiple Vitamins-Minerals (MULTIVITAMIN WITH MINERALS) tablet Take 1 tablet by mouth daily.     naproxen sodium (ALEVE) 220 MG tablet Take 2 tablets (440 mg total) by mouth 2 (two) times daily as needed (pain). 120 tablet 5   oxymetazoline (AFRIN SINUS) 0.05 % nasal spray Place 1 spray into both nostrils 2 (two) times daily. Only for 3 days. (Patient taking differently: Place 1 spray into both nostrils 2 (two) times daily as needed for congestion. Only for 3 days.) 30 mL 0   predniSONE (DELTASONE) 20 MG tablet Take 3 tablets for 3 days, take 2 tablets for 3 days, take 1 tablet for 3 days, take 1/2 tablet for 4 days. 20 tablet 0   rosuvastatin (CRESTOR) 20 MG tablet Take 1 tablet (20 mg total) by mouth daily. 90 tablet 3   traZODone (DESYREL) 100 MG tablet TAKE 2 TABLETS BY MOUTH AT BEDTIME 180 tablet 0   trolamine salicylate (ASPERCREME) 10 % cream Apply 1 application. topically as needed for muscle pain.     No current facility-administered medications for this visit.    Allergies  Allergen Reactions   Cialis [Tadalafil]     Hallucinations    Lipitor [Atorvastatin]     itching   Niacin And Related Itching   Pravachol [Pravastatin] Itching    Itching.      REVIEW OF SYSTEMS:   '[X]'$  denotes  positive finding, '[ ]'$  denotes negative finding Cardiac  Comments:  Chest pain or chest pressure:    Shortness of breath upon exertion:    Short of breath when lying flat:    Irregular heart rhythm:        Vascular    Pain in calf, thigh, or hip brought on by ambulation:    Pain in feet at night that  wakes you up from your sleep:     Blood clot in your veins:    Leg swelling:         Pulmonary    Oxygen at home:    Productive cough:     Wheezing:         Neurologic    Sudden weakness in arms or legs:     Sudden numbness in arms or legs:     Sudden onset of difficulty speaking or slurred speech:    Temporary loss of vision in one eye:     Problems with dizziness:         Gastrointestinal    Blood in stool:     Vomited blood:         Genitourinary    Burning when urinating:     Blood in urine:        Psychiatric    Major depression:         Hematologic    Bleeding problems:    Problems with blood clotting too easily:        Skin    Rashes or ulcers:        Constitutional    Fever or chills:      PHYSICAL EXAMINATION:  Vitals:   03/10/22 0946  BP: (!) 149/78  Pulse: (!) 103  Resp: 20  Temp: 98 F (36.7 C)  TempSrc: Temporal  SpO2: 98%  Weight: 179 lb 14.4 oz (81.6 kg)  Height: '5\' 9"'$  (1.753 m)    General:  WDWN in NAD; vital signs documented above Gait: Not observed HENT: WNL, normocephalic Pulmonary: normal non-labored breathing , without Rales, rhonchi,  wheezing Cardiac: regular HR Abdomen: soft, NT, no masses Skin: without rashes Vascular Exam/Pulses:  Right Left  DP absent 2+ (normal)   Extremities: without ischemic changes, without Gangrene , without cellulitis; without open wounds;  Musculoskeletal: no muscle wasting or atrophy  Neurologic: A&O X 3;  No focal weakness or paresthesias are detected Psychiatric:  The pt has Normal affect.   Non-Invasive Vascular Imaging:   Left external iliac stent widely patent  ABI/TBIToday's  ABIToday's TBIPrevious ABIPrevious TBI  +-------+-----------+-----------+------------+------------+  Right  0.60       0.51       0.55        0.45          +-------+-----------+-----------+------------+------------+  Left   0.92       0.65       0.63        0.45          +-------+-----------+-----------+------------+------------+     ASSESSMENT/PLAN:: 71 y.o. male here for follow up for PAD and surveillance of left iliac artery stent  -Claudication of the left leg has resolved since left external iliac artery stenting; duplex demonstrates widely patent iliac stent and ABI improved appropriately -Based on angiography he has a long segment occlusion of the right SFA.  He does claudicate however symptoms are not lifestyle limiting.  He does not have any rest pain or tissue loss.  We discussed that without rest pain or tissue loss, revascularization is not indicated at this time.  We also discussed that this would most likely involve open bypass surgery.  Patient and wife agree that the degree of claudication does not warrant further work-up at this time. -Continue aspirin, Plavix, statin daily -Recheck iliac stents and ABIs in 1 year.  Patient knows to call/return office sooner with worsening symptoms on the right leg or recurrent  symptoms of the left leg.   Dagoberto Ligas, PA-C [facility] [phone]  Clinic MD:   Carlis Abbott

## 2022-03-27 DIAGNOSIS — I517 Cardiomegaly: Secondary | ICD-10-CM | POA: Diagnosis not present

## 2022-03-27 DIAGNOSIS — I352 Nonrheumatic aortic (valve) stenosis with insufficiency: Secondary | ICD-10-CM | POA: Diagnosis not present

## 2022-03-29 ENCOUNTER — Other Ambulatory Visit: Payer: Self-pay | Admitting: Physician Assistant

## 2022-03-29 DIAGNOSIS — R600 Localized edema: Secondary | ICD-10-CM

## 2022-04-04 ENCOUNTER — Other Ambulatory Visit: Payer: Self-pay | Admitting: Physician Assistant

## 2022-04-04 DIAGNOSIS — J302 Other seasonal allergic rhinitis: Secondary | ICD-10-CM

## 2022-04-26 ENCOUNTER — Other Ambulatory Visit: Payer: Self-pay | Admitting: Physician Assistant

## 2022-04-26 DIAGNOSIS — N400 Enlarged prostate without lower urinary tract symptoms: Secondary | ICD-10-CM

## 2022-04-26 DIAGNOSIS — Z8673 Personal history of transient ischemic attack (TIA), and cerebral infarction without residual deficits: Secondary | ICD-10-CM

## 2022-04-26 DIAGNOSIS — I10 Essential (primary) hypertension: Secondary | ICD-10-CM

## 2022-04-29 ENCOUNTER — Other Ambulatory Visit: Payer: Self-pay | Admitting: Physician Assistant

## 2022-04-29 DIAGNOSIS — J449 Chronic obstructive pulmonary disease, unspecified: Secondary | ICD-10-CM

## 2022-05-25 ENCOUNTER — Other Ambulatory Visit: Payer: Self-pay | Admitting: Physician Assistant

## 2022-05-25 DIAGNOSIS — I6339 Cerebral infarction due to thrombosis of other cerebral artery: Secondary | ICD-10-CM

## 2022-07-05 ENCOUNTER — Other Ambulatory Visit: Payer: Self-pay | Admitting: Physician Assistant

## 2022-07-05 DIAGNOSIS — G479 Sleep disorder, unspecified: Secondary | ICD-10-CM

## 2022-07-30 ENCOUNTER — Other Ambulatory Visit: Payer: Self-pay | Admitting: Physician Assistant

## 2022-07-30 DIAGNOSIS — R600 Localized edema: Secondary | ICD-10-CM

## 2022-08-03 ENCOUNTER — Telehealth: Payer: Self-pay | Admitting: Physician Assistant

## 2022-08-03 NOTE — Telephone Encounter (Signed)
LVM to schedule annual wellness

## 2022-08-06 ENCOUNTER — Telehealth: Payer: Self-pay

## 2022-08-07 NOTE — Telephone Encounter (Signed)
Signed and at the front for patient pick up, you want to call and let them know?

## 2022-08-07 NOTE — Telephone Encounter (Signed)
Ok for handicap placard

## 2022-08-08 ENCOUNTER — Other Ambulatory Visit: Payer: Self-pay | Admitting: Physician Assistant

## 2022-08-08 DIAGNOSIS — G479 Sleep disorder, unspecified: Secondary | ICD-10-CM

## 2022-08-28 ENCOUNTER — Other Ambulatory Visit: Payer: Self-pay | Admitting: Physician Assistant

## 2022-08-28 DIAGNOSIS — G479 Sleep disorder, unspecified: Secondary | ICD-10-CM

## 2022-08-28 DIAGNOSIS — Z8673 Personal history of transient ischemic attack (TIA), and cerebral infarction without residual deficits: Secondary | ICD-10-CM

## 2022-08-28 DIAGNOSIS — I10 Essential (primary) hypertension: Secondary | ICD-10-CM

## 2022-08-28 DIAGNOSIS — N400 Enlarged prostate without lower urinary tract symptoms: Secondary | ICD-10-CM

## 2022-09-01 ENCOUNTER — Ambulatory Visit (INDEPENDENT_AMBULATORY_CARE_PROVIDER_SITE_OTHER): Payer: Medicare HMO | Admitting: Physician Assistant

## 2022-09-01 VITALS — BP 139/59 | HR 76 | Ht 69.0 in | Wt 176.0 lb

## 2022-09-01 DIAGNOSIS — R7309 Other abnormal glucose: Secondary | ICD-10-CM

## 2022-09-01 DIAGNOSIS — J449 Chronic obstructive pulmonary disease, unspecified: Secondary | ICD-10-CM

## 2022-09-01 DIAGNOSIS — I2510 Atherosclerotic heart disease of native coronary artery without angina pectoris: Secondary | ICD-10-CM

## 2022-09-01 DIAGNOSIS — G479 Sleep disorder, unspecified: Secondary | ICD-10-CM

## 2022-09-01 DIAGNOSIS — Z79899 Other long term (current) drug therapy: Secondary | ICD-10-CM

## 2022-09-01 DIAGNOSIS — I739 Peripheral vascular disease, unspecified: Secondary | ICD-10-CM

## 2022-09-01 DIAGNOSIS — D125 Benign neoplasm of sigmoid colon: Secondary | ICD-10-CM | POA: Diagnosis not present

## 2022-09-01 DIAGNOSIS — M25512 Pain in left shoulder: Secondary | ICD-10-CM | POA: Diagnosis not present

## 2022-09-01 DIAGNOSIS — E782 Mixed hyperlipidemia: Secondary | ICD-10-CM

## 2022-09-01 DIAGNOSIS — F411 Generalized anxiety disorder: Secondary | ICD-10-CM

## 2022-09-01 DIAGNOSIS — Z125 Encounter for screening for malignant neoplasm of prostate: Secondary | ICD-10-CM | POA: Diagnosis not present

## 2022-09-01 DIAGNOSIS — R69 Illness, unspecified: Secondary | ICD-10-CM | POA: Diagnosis not present

## 2022-09-01 MED ORDER — DICLOFENAC SODIUM 1 % EX GEL: 4.0000 g | Freq: Four times a day (QID) | CUTANEOUS | 1 refills | Status: AC

## 2022-09-01 NOTE — Progress Notes (Unsigned)
Established Patient Office Visit  Subjective   Patient ID: Travis Barajas, male    DOB: Dec 10, 1950  Age: 72 y.o. MRN: [id number]  Chief Complaint  Patient presents with   Follow-up    HPI Pt is a 72 yo male with COPD, ED, HTN, CAD, GERD, PVD, Claudication, MDD who presents to the clinic for medication refills.   He is doing well. Denies any CP, palpitations, headaches or vision changes. No swelling of legs. He is not very active but does work around the house. He was lifing something the other day and hurt his left shoulder. It is still sore in the front and when he tries to lift it up above his head. No strength changes. He denies any leg pain or cramps. His mood is good.   He is scheduled for penis implant surgery at end of this month.   .. Active Ambulatory Problems    Diagnosis Date Noted   GERD (gastroesophageal reflux disease) 07/23/2011   Allergic rhinitis 07/23/2011   Joint pain 07/23/2011   HTN (hypertension), malignant 10/01/2011   Tobacco user 12/05/2011   Hematuria 11/07/2013   Hyperlipidemia 11/17/2013   COPD, moderate ([place]) 03/21/2014   Erectile dysfunction 03/21/2014   Claudication ([place]) 09/26/2014   Generalized anxiety disorder 01/15/2015   BPH without urinary obstruction 05/25/2016   Acute nonseasonal allergic rhinitis due to pollen 05/04/2017   Dyslipidemia 05/04/2017   Current smoker 09/01/2017   Hemoptysis 09/01/2017   Left carotid bruit [id number]   Systolic murmur [id number]   Trouble in sleeping 10/05/2018   PVD (peripheral vascular disease) with claudication ([place]) 02/22/2019   Thrombocytopenia ([place]) 02/24/2019   Slurred speech 02/24/2019   Dizziness 02/24/2019   Dysphagia 02/24/2019   TIA (transient ischemic attack) 02/24/2019   Intercostal retractions 03/31/2019   Cerebrovascular accident ([place]) 04/03/2019   Distended abdomen 04/14/2019   Primary hypertension 04/14/2019   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[place]) 04/26/2019   Renal cyst, left  04/26/2019   Gallstone 04/26/2019   Lower extremity edema 06/15/2019   Anxiety 06/15/2019   Wheezing 07/24/2019   COPD with acute exacerbation ([place]) 07/24/2019   Left thigh pain 07/24/2019   Acute left-sided low back pain with left-sided sciatica 07/24/2019   Pulmonary nodules 07/31/2019   Change in voice 09/01/2019   Expiratory stridor 09/01/2019   Choking 09/01/2019   Abnormal CT scan, neck 09/01/2019   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   Bilateral hand swelling 01/05/2020   Plantar fasciitis 01/05/2020   Seasonal allergies 11/29/2020   Benign prostatic hyperplasia with urinary frequency 04/08/2021   Diminished pulses in lower extremity 08/26/2021   Elevated fasting glucose 08/27/2021   Moderate asthma with exacerbation 11/25/2021   Aortic atherosclerosis ([place]) 02/02/2022   Coronary artery disease involving native heart without angina pectoris 02/02/2022   Pre-diabetes 09/02/2022   Resolved Ambulatory Problems    Diagnosis Date Noted   SOB (shortness of breath) 10/18/2012   BPH (benign prostatic hyperplasia) 11/21/2013   Pneumonia of left lower lobe due to infectious organism 04/03/2019   Past Medical History:  Diagnosis Date   Allergy    Arthritis    COPD (chronic obstructive pulmonary disease) ([place])    Hypertension    Stroke ([place])      ROS See HPI.    Objective:     BP (!) 139/59   Pulse 76   Ht 5\' 9"  (1.753 m)   Wt 176 lb (79.8 kg)   SpO2 98%   BMI 25.99 kg/m  BP Readings  from Last 3 Encounters:  09/01/22 (!) 139/59  03/10/22 (!) 149/78  02/13/22 (!) 149/60   Wt Readings from Last 3 Encounters:  09/01/22 176 lb (79.8 kg)  03/10/22 179 lb 14.4 oz (81.6 kg)  02/13/22 185 lb (83.9 kg)   ..    09/01/2022   11:35 AM 05/03/2020   10:14 AM 07/17/2019    3:10 PM 11/26/2017    3:42 PM 05/03/2017   10:46 AM  Depression screen PHQ 2/9  Decreased Interest 0 0 0 0 0  Down, Depressed, Hopeless 0 0 0 0 0  PHQ - 2 Score 0 0 0 0 0  Altered sleeping  2  2    Tired, decreased energy  0  0   Change in appetite  2  0   Feeling bad or failure about yourself   0  0   Trouble concentrating  0  0   Moving slowly or fidgety/restless  0  0   Suicidal thoughts  0  0   PHQ-9 Score  4  2          Physical Exam Constitutional:      Appearance: Normal appearance.  HENT:     Head: Normocephalic.  Neck:     Vascular: No carotid bruit.  Cardiovascular:     Rate and Rhythm: Normal rate.     Heart sounds: Murmur heard.  Pulmonary:     Effort: Pulmonary effort is normal.     Breath sounds: Normal breath sounds.  Musculoskeletal:     Right lower leg: No edema.     Left lower leg: No edema.     Comments: Left shoulder:  Full ROM with some anterior/lateral pain No real tenderness to palpation over left shoulder Negative drop arm sign Strength 5/5 Negative yeragson.    Lymphadenopathy:     Cervical: No cervical adenopathy.  Neurological:     General: No focal deficit present.     Mental Status: He is alert and oriented to person, place, and time.  Psychiatric:        Mood and Affect: Mood normal.        Assessment & Plan:  Marland KitchenMarland KitchenBartley Aase was seen today for follow-up.  Diagnoses and all orders for this visit:  COPD, moderate ([place])  Elevated glucose -     Hemoglobin A1c  Medication management -     COMPLETE METABOLIC PANEL WITH GFR -     PSA  Acute pain of left shoulder -     diclofenac Sodium (VOLTAREN) 1 % GEL; Apply 4 g topically 4 (four) times daily. To affected joint.  Mixed hyperlipidemia  PVD (peripheral vascular disease) with claudication (HCC)  Coronary artery disease involving native heart without angina pectoris, unspecified vessel or lesion type  Trouble in sleeping -     traZODone (DESYREL) 100 MG tablet; TAKE 2 TABLETS BY MOUTH AT BEDTIMe.  Generalized anxiety disorder -     escitalopram (LEXAPRO) 5 MG tablet; Take 1 tablet (5 mg total) by mouth daily.   Labs ordered for screening purposes BP right at  goal Refilled but keep watch at home goal under 130/80  Lexapro and trazodone for mood and sleep refilled  Discussed right shoulder Gave some roator cuff exercises Use voltaren and ice Follow up as needed or if pain or symptoms worsen   Iran Planas, PA-C

## 2022-09-01 NOTE — Patient Instructions (Signed)
Voltaren gel and rotator cuff exercises Get labs

## 2022-09-02 ENCOUNTER — Encounter: Payer: Self-pay | Admitting: Physician Assistant

## 2022-09-02 DIAGNOSIS — R7303 Prediabetes: Secondary | ICD-10-CM | POA: Insufficient documentation

## 2022-09-02 LAB — HEMOGLOBIN A1C
Hgb A1c MFr Bld: 5.7 % of total Hgb — ABNORMAL HIGH (ref <5.7)
Mean Plasma Glucose: 117 mg/dL
eAG (mmol/L): 6.5 mmol/L

## 2022-09-02 LAB — COMPLETE METABOLIC PANEL WITH GFR
AG Ratio: 1.5 (calc) (ref 1.0–2.5)
ALT: 10 U/L (ref 9–46)
AST: 13 U/L (ref 10–35)
Albumin: 4 g/dL (ref 3.6–5.1)
Alkaline phosphatase (APISO): 88 U/L (ref 35–144)
BUN: 11 mg/dL (ref 7–25)
CO2: 34 mmol/L — ABNORMAL HIGH (ref 20–32)
Calcium: 9.2 mg/dL (ref 8.6–10.3)
Chloride: 105 mmol/L (ref 98–110)
Creat: 0.9 mg/dL (ref 0.70–1.28)
Globulin: 2.6 g/dL (calc) (ref 1.9–3.7)
Glucose, Bld: 82 mg/dL (ref 65–99)
Potassium: 3.6 mmol/L (ref 3.5–5.3)
Sodium: 145 mmol/L (ref 135–146)
Total Bilirubin: 0.6 mg/dL (ref 0.2–1.2)
Total Protein: 6.6 g/dL (ref 6.1–8.1)
eGFR: 92 mL/min/{1.73_m2} (ref >=60)

## 2022-09-02 LAB — PSA: PSA: 1.18 ng/mL (ref <=4.00)

## 2022-09-02 MED ORDER — TRAZODONE HCL 100 MG PO TABS: ORAL_TABLET | ORAL | 3 refills | Status: DC

## 2022-09-02 MED ORDER — ESCITALOPRAM OXALATE 5 MG PO TABS: 5.0000 mg | ORAL_TABLET | Freq: Every day | ORAL | 1 refills | Status: DC

## 2022-09-02 NOTE — Progress Notes (Signed)
Travis Barajas,   Kidney, liver, glucose looks great.  PSA looks good.  A1C up from 1 year ago but in pre-diabetes range. Limit sugars and carbs in diet. Recheck in 6 months.

## 2022-09-03 ENCOUNTER — Other Ambulatory Visit: Payer: Self-pay | Admitting: Neurology

## 2022-09-03 MED ORDER — ROSUVASTATIN CALCIUM 20 MG PO TABS: 20.0000 mg | ORAL_TABLET | Freq: Every day | ORAL | 1 refills | Status: DC

## 2022-09-04 DIAGNOSIS — I440 Atrioventricular block, first degree: Secondary | ICD-10-CM | POA: Diagnosis not present

## 2022-09-04 DIAGNOSIS — Z01818 Encounter for other preprocedural examination: Secondary | ICD-10-CM | POA: Diagnosis not present

## 2022-09-04 DIAGNOSIS — Y732 Prosthetic and other implants, materials and accessory gastroenterology and urology devices associated with adverse incidents: Secondary | ICD-10-CM | POA: Diagnosis not present

## 2022-09-04 DIAGNOSIS — R9431 Abnormal electrocardiogram [ECG] [EKG]: Secondary | ICD-10-CM | POA: Diagnosis not present

## 2022-09-04 DIAGNOSIS — T83490D Other mechanical complication of penile (implanted) prosthesis, subsequent encounter: Secondary | ICD-10-CM | POA: Diagnosis not present

## 2022-09-04 DIAGNOSIS — N529 Male erectile dysfunction, unspecified: Secondary | ICD-10-CM | POA: Diagnosis not present

## 2022-09-05 DIAGNOSIS — I440 Atrioventricular block, first degree: Secondary | ICD-10-CM | POA: Diagnosis not present

## 2022-09-07 DIAGNOSIS — N5239 Other post-surgical erectile dysfunction: Secondary | ICD-10-CM | POA: Diagnosis not present

## 2022-09-07 DIAGNOSIS — R972 Elevated prostate specific antigen [PSA]: Secondary | ICD-10-CM | POA: Diagnosis not present

## 2022-09-07 DIAGNOSIS — N529 Male erectile dysfunction, unspecified: Secondary | ICD-10-CM | POA: Diagnosis not present

## 2022-09-08 DIAGNOSIS — H2513 Age-related nuclear cataract, bilateral: Secondary | ICD-10-CM | POA: Diagnosis not present

## 2022-09-08 DIAGNOSIS — H25013 Cortical age-related cataract, bilateral: Secondary | ICD-10-CM | POA: Diagnosis not present

## 2022-09-09 ENCOUNTER — Other Ambulatory Visit: Payer: Self-pay | Admitting: Physician Assistant

## 2022-09-09 DIAGNOSIS — Z8673 Personal history of transient ischemic attack (TIA), and cerebral infarction without residual deficits: Secondary | ICD-10-CM

## 2022-09-09 DIAGNOSIS — I10 Essential (primary) hypertension: Secondary | ICD-10-CM

## 2022-09-10 DIAGNOSIS — Y838 Other surgical procedures as the cause of abnormal reaction of the patient, or of later complication, without mention of misadventure at the time of the procedure: Secondary | ICD-10-CM | POA: Diagnosis not present

## 2022-09-10 DIAGNOSIS — I10 Essential (primary) hypertension: Secondary | ICD-10-CM | POA: Diagnosis not present

## 2022-09-10 DIAGNOSIS — Z79899 Other long term (current) drug therapy: Secondary | ICD-10-CM | POA: Diagnosis not present

## 2022-09-10 DIAGNOSIS — T83490A Other mechanical complication of penile (implanted) prosthesis, initial encounter: Secondary | ICD-10-CM | POA: Diagnosis not present

## 2022-09-10 DIAGNOSIS — Z8673 Personal history of transient ischemic attack (TIA), and cerebral infarction without residual deficits: Secondary | ICD-10-CM | POA: Diagnosis not present

## 2022-09-10 DIAGNOSIS — I700 Atherosclerosis of aorta: Secondary | ICD-10-CM | POA: Diagnosis not present

## 2022-09-10 DIAGNOSIS — T83410A Breakdown (mechanical) of penile (implanted) prosthesis, initial encounter: Secondary | ICD-10-CM | POA: Diagnosis not present

## 2022-09-10 DIAGNOSIS — Z7982 Long term (current) use of aspirin: Secondary | ICD-10-CM | POA: Diagnosis not present

## 2022-09-10 DIAGNOSIS — Y9289 Other specified places as the place of occurrence of the external cause: Secondary | ICD-10-CM | POA: Diagnosis not present

## 2022-09-10 DIAGNOSIS — K219 Gastro-esophageal reflux disease without esophagitis: Secondary | ICD-10-CM | POA: Diagnosis not present

## 2022-09-10 DIAGNOSIS — I2510 Atherosclerotic heart disease of native coronary artery without angina pectoris: Secondary | ICD-10-CM | POA: Diagnosis not present

## 2022-09-10 DIAGNOSIS — J449 Chronic obstructive pulmonary disease, unspecified: Secondary | ICD-10-CM | POA: Diagnosis not present

## 2022-09-10 DIAGNOSIS — N5239 Other post-surgical erectile dysfunction: Secondary | ICD-10-CM | POA: Diagnosis not present

## 2022-09-11 DIAGNOSIS — I700 Atherosclerosis of aorta: Secondary | ICD-10-CM | POA: Diagnosis not present

## 2022-09-11 DIAGNOSIS — K219 Gastro-esophageal reflux disease without esophagitis: Secondary | ICD-10-CM | POA: Diagnosis not present

## 2022-09-11 DIAGNOSIS — Z7982 Long term (current) use of aspirin: Secondary | ICD-10-CM | POA: Diagnosis not present

## 2022-09-11 DIAGNOSIS — Y9289 Other specified places as the place of occurrence of the external cause: Secondary | ICD-10-CM | POA: Diagnosis not present

## 2022-09-11 DIAGNOSIS — Z79899 Other long term (current) drug therapy: Secondary | ICD-10-CM | POA: Diagnosis not present

## 2022-09-11 DIAGNOSIS — I2510 Atherosclerotic heart disease of native coronary artery without angina pectoris: Secondary | ICD-10-CM | POA: Diagnosis not present

## 2022-09-11 DIAGNOSIS — Y838 Other surgical procedures as the cause of abnormal reaction of the patient, or of later complication, without mention of misadventure at the time of the procedure: Secondary | ICD-10-CM | POA: Diagnosis not present

## 2022-09-11 DIAGNOSIS — N521 Erectile dysfunction due to diseases classified elsewhere: Secondary | ICD-10-CM | POA: Diagnosis not present

## 2022-09-11 DIAGNOSIS — Z8673 Personal history of transient ischemic attack (TIA), and cerebral infarction without residual deficits: Secondary | ICD-10-CM | POA: Diagnosis not present

## 2022-09-11 DIAGNOSIS — J449 Chronic obstructive pulmonary disease, unspecified: Secondary | ICD-10-CM | POA: Diagnosis not present

## 2022-09-11 DIAGNOSIS — N5239 Other post-surgical erectile dysfunction: Secondary | ICD-10-CM | POA: Diagnosis not present

## 2022-09-11 DIAGNOSIS — I779 Disorder of arteries and arterioles, unspecified: Secondary | ICD-10-CM | POA: Diagnosis not present

## 2022-09-11 DIAGNOSIS — T83490A Other mechanical complication of penile (implanted) prosthesis, initial encounter: Secondary | ICD-10-CM | POA: Diagnosis not present

## 2022-09-11 DIAGNOSIS — I10 Essential (primary) hypertension: Secondary | ICD-10-CM | POA: Diagnosis not present

## 2022-09-22 ENCOUNTER — Other Ambulatory Visit: Payer: Self-pay | Admitting: Physician Assistant

## 2022-09-22 DIAGNOSIS — F419 Anxiety disorder, unspecified: Secondary | ICD-10-CM

## 2022-09-22 DIAGNOSIS — N529 Male erectile dysfunction, unspecified: Secondary | ICD-10-CM | POA: Diagnosis not present

## 2022-10-05 DIAGNOSIS — N529 Male erectile dysfunction, unspecified: Secondary | ICD-10-CM | POA: Diagnosis not present

## 2022-10-21 DIAGNOSIS — N529 Male erectile dysfunction, unspecified: Secondary | ICD-10-CM | POA: Diagnosis not present

## 2022-10-22 ENCOUNTER — Other Ambulatory Visit: Payer: Self-pay | Admitting: Physician Assistant

## 2022-10-22 DIAGNOSIS — I6339 Cerebral infarction due to thrombosis of other cerebral artery: Secondary | ICD-10-CM

## 2022-10-26 ENCOUNTER — Other Ambulatory Visit: Payer: Self-pay | Admitting: Physician Assistant

## 2022-10-26 DIAGNOSIS — R600 Localized edema: Secondary | ICD-10-CM

## 2022-10-30 ENCOUNTER — Other Ambulatory Visit: Payer: Self-pay | Admitting: Physician Assistant

## 2022-10-30 DIAGNOSIS — F419 Anxiety disorder, unspecified: Secondary | ICD-10-CM

## 2022-11-04 DIAGNOSIS — N529 Male erectile dysfunction, unspecified: Secondary | ICD-10-CM | POA: Diagnosis not present

## 2022-11-08 ENCOUNTER — Other Ambulatory Visit: Payer: Self-pay | Admitting: Physician Assistant

## 2022-11-08 DIAGNOSIS — F419 Anxiety disorder, unspecified: Secondary | ICD-10-CM

## 2022-11-24 DIAGNOSIS — N5239 Other post-surgical erectile dysfunction: Secondary | ICD-10-CM | POA: Diagnosis not present

## 2022-11-27 DIAGNOSIS — I10 Essential (primary) hypertension: Secondary | ICD-10-CM | POA: Diagnosis not present

## 2022-11-27 DIAGNOSIS — H2512 Age-related nuclear cataract, left eye: Secondary | ICD-10-CM | POA: Diagnosis not present

## 2022-11-27 DIAGNOSIS — H2513 Age-related nuclear cataract, bilateral: Secondary | ICD-10-CM | POA: Diagnosis not present

## 2022-11-27 DIAGNOSIS — H25013 Cortical age-related cataract, bilateral: Secondary | ICD-10-CM | POA: Diagnosis not present

## 2022-11-27 DIAGNOSIS — H25043 Posterior subcapsular polar age-related cataract, bilateral: Secondary | ICD-10-CM | POA: Diagnosis not present

## 2022-12-04 ENCOUNTER — Other Ambulatory Visit: Payer: Self-pay | Admitting: Physician Assistant

## 2022-12-04 DIAGNOSIS — I10 Essential (primary) hypertension: Secondary | ICD-10-CM

## 2022-12-04 DIAGNOSIS — Z8673 Personal history of transient ischemic attack (TIA), and cerebral infarction without residual deficits: Secondary | ICD-10-CM

## 2022-12-11 ENCOUNTER — Other Ambulatory Visit: Payer: Self-pay | Admitting: Physician Assistant

## 2022-12-11 DIAGNOSIS — I10 Essential (primary) hypertension: Secondary | ICD-10-CM

## 2022-12-11 DIAGNOSIS — N400 Enlarged prostate without lower urinary tract symptoms: Secondary | ICD-10-CM

## 2022-12-11 DIAGNOSIS — F419 Anxiety disorder, unspecified: Secondary | ICD-10-CM

## 2022-12-14 DIAGNOSIS — N5239 Other post-surgical erectile dysfunction: Secondary | ICD-10-CM | POA: Diagnosis not present

## 2023-01-04 ENCOUNTER — Ambulatory Visit (INDEPENDENT_AMBULATORY_CARE_PROVIDER_SITE_OTHER): Payer: Medicare HMO | Admitting: Physician Assistant

## 2023-01-04 ENCOUNTER — Encounter: Payer: Self-pay | Admitting: Physician Assistant

## 2023-01-04 VITALS — BP 142/86 | HR 90 | Resp 17 | Wt 179.0 lb

## 2023-01-04 DIAGNOSIS — R600 Localized edema: Secondary | ICD-10-CM

## 2023-01-04 DIAGNOSIS — J302 Other seasonal allergic rhinitis: Secondary | ICD-10-CM | POA: Diagnosis not present

## 2023-01-04 DIAGNOSIS — Z8673 Personal history of transient ischemic attack (TIA), and cerebral infarction without residual deficits: Secondary | ICD-10-CM

## 2023-01-04 DIAGNOSIS — F411 Generalized anxiety disorder: Secondary | ICD-10-CM

## 2023-01-04 DIAGNOSIS — G479 Sleep disorder, unspecified: Secondary | ICD-10-CM | POA: Diagnosis not present

## 2023-01-04 DIAGNOSIS — N400 Enlarged prostate without lower urinary tract symptoms: Secondary | ICD-10-CM | POA: Diagnosis not present

## 2023-01-04 DIAGNOSIS — J449 Chronic obstructive pulmonary disease, unspecified: Secondary | ICD-10-CM | POA: Diagnosis not present

## 2023-01-04 DIAGNOSIS — M722 Plantar fascial fibromatosis: Secondary | ICD-10-CM

## 2023-01-04 DIAGNOSIS — F419 Anxiety disorder, unspecified: Secondary | ICD-10-CM | POA: Diagnosis not present

## 2023-01-04 DIAGNOSIS — L989 Disorder of the skin and subcutaneous tissue, unspecified: Secondary | ICD-10-CM

## 2023-01-04 DIAGNOSIS — R7303 Prediabetes: Secondary | ICD-10-CM

## 2023-01-04 DIAGNOSIS — B356 Tinea cruris: Secondary | ICD-10-CM | POA: Diagnosis not present

## 2023-01-04 DIAGNOSIS — E785 Hyperlipidemia, unspecified: Secondary | ICD-10-CM

## 2023-01-04 DIAGNOSIS — I10 Essential (primary) hypertension: Secondary | ICD-10-CM

## 2023-01-04 DIAGNOSIS — S90121A Contusion of right lesser toe(s) without damage to nail, initial encounter: Secondary | ICD-10-CM

## 2023-01-04 DIAGNOSIS — M25512 Pain in left shoulder: Secondary | ICD-10-CM

## 2023-01-04 DIAGNOSIS — R062 Wheezing: Secondary | ICD-10-CM | POA: Diagnosis not present

## 2023-01-04 DIAGNOSIS — I6339 Cerebral infarction due to thrombosis of other cerebral artery: Secondary | ICD-10-CM

## 2023-01-04 DIAGNOSIS — I739 Peripheral vascular disease, unspecified: Secondary | ICD-10-CM

## 2023-01-04 LAB — POCT GLYCOSYLATED HEMOGLOBIN (HGB A1C): HbA1c, POC (prediabetic range): 6 % (ref 5.7–6.4)

## 2023-01-04 MED ORDER — MONTELUKAST SODIUM 10 MG PO TABS
10.0000 mg | ORAL_TABLET | Freq: Every day | ORAL | 3 refills | Status: DC
Start: 2023-01-04 — End: 2023-08-17

## 2023-01-04 MED ORDER — CEPHALEXIN 500 MG PO CAPS: 500.0000 mg | ORAL_CAPSULE | Freq: Two times a day (BID) | ORAL | 0 refills | Status: DC

## 2023-01-04 MED ORDER — DICLOFENAC SODIUM 1 % EX GEL
4.0000 g | Freq: Four times a day (QID) | CUTANEOUS | 3 refills | Status: DC
Start: 2023-01-04 — End: 2023-08-17

## 2023-01-04 MED ORDER — LEVOCETIRIZINE DIHYDROCHLORIDE 5 MG PO TABS
5.0000 mg | ORAL_TABLET | Freq: Every day | ORAL | 3 refills | Status: DC
Start: 2023-01-04 — End: 2023-08-17

## 2023-01-04 MED ORDER — CLOTRIMAZOLE-BETAMETHASONE 1-0.05 % EX CREA
1.0000 | TOPICAL_CREAM | Freq: Every day | CUTANEOUS | 1 refills | Status: DC
Start: 2023-01-04 — End: 2023-08-17

## 2023-01-04 MED ORDER — DOXAZOSIN MESYLATE 4 MG PO TABS
4.0000 mg | ORAL_TABLET | Freq: Two times a day (BID) | ORAL | 1 refills | Status: DC
Start: 2023-01-04 — End: 2023-08-17

## 2023-01-04 MED ORDER — LOSARTAN POTASSIUM 50 MG PO TABS
50.0000 mg | ORAL_TABLET | Freq: Every day | ORAL | 1 refills | Status: DC
Start: 2023-01-04 — End: 2023-08-17

## 2023-01-04 MED ORDER — BUDESONIDE-FORMOTEROL FUMARATE 160-4.5 MCG/ACT IN AERO
2.0000 | INHALATION_SPRAY | Freq: Two times a day (BID) | RESPIRATORY_TRACT | 4 refills | Status: DC
Start: 2023-01-04 — End: 2023-08-17

## 2023-01-04 MED ORDER — CLOPIDOGREL BISULFATE 75 MG PO TABS
75.0000 mg | ORAL_TABLET | Freq: Every day | ORAL | 1 refills | Status: DC
Start: 2023-01-04 — End: 2023-07-22

## 2023-01-04 MED ORDER — ALPRAZOLAM 0.5 MG PO TABS
0.5000 mg | ORAL_TABLET | Freq: Two times a day (BID) | ORAL | 1 refills | Status: DC | PRN
Start: 2023-01-04 — End: 2023-06-25

## 2023-01-04 MED ORDER — METOPROLOL SUCCINATE ER 100 MG PO TB24
100.0000 mg | ORAL_TABLET | Freq: Every day | ORAL | 1 refills | Status: DC
Start: 2023-01-04 — End: 2023-08-17

## 2023-01-04 MED ORDER — ESCITALOPRAM OXALATE 5 MG PO TABS
5.0000 mg | ORAL_TABLET | Freq: Every day | ORAL | 1 refills | Status: DC
Start: 2023-01-04 — End: 2023-08-17

## 2023-01-04 MED ORDER — FUROSEMIDE 20 MG PO TABS
20.0000 mg | ORAL_TABLET | Freq: Every day | ORAL | 1 refills | Status: DC
Start: 2023-01-04 — End: 2023-07-22

## 2023-01-04 MED ORDER — FLUCONAZOLE 150 MG PO TABS
150.0000 mg | ORAL_TABLET | Freq: Once | ORAL | 0 refills | Status: AC
Start: 2023-01-04 — End: 2023-01-04

## 2023-01-04 MED ORDER — ALBUTEROL SULFATE HFA 108 (90 BASE) MCG/ACT IN AERS: 2.0000 | INHALATION_SPRAY | Freq: Four times a day (QID) | RESPIRATORY_TRACT | 1 refills | Status: DC | PRN

## 2023-01-04 MED ORDER — AMLODIPINE BESYLATE 10 MG PO TABS
10.0000 mg | ORAL_TABLET | Freq: Every day | ORAL | 1 refills | Status: DC
Start: 2023-01-04 — End: 2023-08-17

## 2023-01-04 MED ORDER — TRAZODONE HCL 100 MG PO TABS
ORAL_TABLET | ORAL | 3 refills | Status: DC
Start: 2023-01-04 — End: 2023-08-17

## 2023-01-04 MED ORDER — ROSUVASTATIN CALCIUM 20 MG PO TABS
20.0000 mg | ORAL_TABLET | Freq: Every day | ORAL | 1 refills | Status: DC
Start: 2023-01-04 — End: 2023-08-17

## 2023-01-04 MED ORDER — IPRATROPIUM-ALBUTEROL 0.5-2.5 (3) MG/3ML IN SOLN
3.0000 mL | Freq: Four times a day (QID) | RESPIRATORY_TRACT | 1 refills | Status: DC | PRN
Start: 2023-01-04 — End: 2023-08-17

## 2023-01-04 NOTE — Patient Instructions (Signed)
Jock Itch Jock itch (tinea cruris) is an infection of the skin in the groin area that is caused by a fungus. Jock itch causes an itchy rash in the groin and upper thigh area. It usually goes away in 2-3 weeks with treatment. What are the causes? The fungus that causes jock itch may be spread by: Touching a fungal infection elsewhere on your body, such as athlete's foot, and then touching your groin area. Sharing towels or clothing, such as socks or shoes, with someone who has a fungal infection. What increases the risk? Jock itch is most common in men and adolescent boys. You are also more likely to develop the condition if you: Are in a hot, humid climate. Wear tight-fitting clothing or wet bathing suits for long periods of time. Play sports. Are overweight. Have diabetes. Have a weakened body defense system (immune system). Sweat a lot. What are the signs or symptoms? Symptoms of jock itch may include: A red, pink, or brown rash in the groin area. Blisters may be present. The rash may spread to the thighs, the opening between the buttocks (anus), and the buttocks. Dry and scaly skin on or around the rash. Itchiness. How is this diagnosed? In most cases, your health care provider can make the diagnosis by looking at your rash. In some cases, a sample of infected skin may be scraped off. This sample may be examined under a microscope (biopsy) or by trying to grow the fungus from the sample (culture). How is this treated? Treatment for this condition may include: Antifungal medicine to kill the fungus. This may be a skin cream, ointment, or powder, or it may be a medicine that you take by mouth (orally). Skin cream or ointment to reduce itching. Lifestyle changes, such as wearing looser clothing and caring for your skin. Follow these instructions at home: Skin care Apply skin creams, ointments, or powders exactly as told by your health care provider. Wear loose-fitting clothing that does  not rub against your groin area. Men should wear boxer shorts or loose-fitting underwear. Keep your groin area clean and dry. Change your underwear every day. Change out of wet bathing suits as soon as possible. After bathing, use a separate towel to gently dry your groin area thoroughly. Using a separate towel will help prevent spreading the infection to other parts of your body. Avoid hot baths and showers. Hot water can make itching worse. Do not scratch the affected area. General instructions Take and apply over-the-counter and prescription medicines only as told by your health care provider. Do not share towels, clothing, or personal items with other people. Wash your hands often with soap and water for at least 20 seconds, especially after touching your groin area. If soap and water are not available, use hand sanitizer. When at the gym: Always wear shoes, especially in the shower and around the swimming pool. Keep any cuts covered. Disinfect any mats or equipment before using them. Shower immediately after working out. Keep all follow-up visits. This is important. Contact a health care provider if: Your rash: Gets worse or does not get better after 2 weeks of treatment. Spreads. Returns after treatment is finished. You have any of the following: A fever. New or worsening redness, swelling, or pain around your rash. Fluid, blood, or pus coming from your rash. Summary Jock itch (tinea cruris) is a fungal infection of the skin in the groin area. The fungus can be spread by sharing clothing or by touching a fungus infection  elsewhere on your body and then touching your groin area. Treatment may include antifungal medicine and lifestyle changes, such as keeping the area clean and dry. This information is not intended to replace advice given to you by your health care provider. Make sure you discuss any questions you have with your health care provider. Document Revised: 08/20/2020  Document Reviewed: 08/20/2020 Elsevier Patient Education  2024 [employer].

## 2023-01-04 NOTE — Progress Notes (Unsigned)
Established Patient Office Visit  Subjective   Patient ID: Travis Barajas, male    DOB: Dec 29, 1950  Age: 72 y.o. MRN: [id number]  Chief Complaint  Patient presents with   Dark spot on left toe    HPI Pt is a 72 yo male with pre-diabetes, hx of stroke, PVD, HLD, COPD, seasonal allergies who presents to the clinic for refills and concern about right great toe.   Patient has not been watching what he eats.  He has also not been very active.  Patient denies any chest pains, shortness of breath, palpitations, headache, vision changes.  He reports that his legs have not been cramping or aching.  He has noticed a dark spot on his right great toe and under toenail on the lateral side.  It is a little tender to touch.  It looks like it has a little sore coming out from it.  He does not remember any trauma to that area.  He is concerned about it not healing.  He also has some recurrent jock itch and rash.  He has tried over-the-counter antifungals and not helping.   Active Ambulatory Problems    Diagnosis Date Noted   GERD (gastroesophageal reflux disease) 07/23/2011   Allergic rhinitis 07/23/2011   Joint pain 07/23/2011   HTN (hypertension), malignant 10/01/2011   Tobacco user 12/05/2011   Hematuria 11/07/2013   Hyperlipidemia 11/17/2013   COPD, moderate (HCC) 03/21/2014   Erectile dysfunction 03/21/2014   Claudication (HCC) 09/26/2014   Generalized anxiety disorder 01/15/2015   BPH without urinary obstruction 05/25/2016   Acute nonseasonal allergic rhinitis due to pollen 05/04/2017   Dyslipidemia 05/04/2017   Current smoker 09/01/2017   Hemoptysis 09/01/2017   Left carotid bruit 09/01/2017   Systolic murmur 09/01/2017   Trouble in sleeping 10/05/2018   PVD (peripheral vascular disease) with claudication (HCC) 02/22/2019   Thrombocytopenia (HCC) 02/24/2019   Slurred speech 02/24/2019   Dizziness 02/24/2019   Dysphagia 02/24/2019   TIA (transient ischemic attack) 02/24/2019    Intercostal retractions 03/31/2019   Cerebrovascular accident (HCC) 04/03/2019   Distended abdomen 04/14/2019   Primary hypertension 04/14/2019   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HCC) 04/26/2019   Renal cyst, left 04/26/2019   Gallstone 04/26/2019   Lower extremity edema 06/15/2019   Anxiety 06/15/2019   Wheezing 07/24/2019   COPD with acute exacerbation (HCC) 07/24/2019   Left thigh pain 07/24/2019   Acute left-sided low back pain with left-sided sciatica 07/24/2019   Pulmonary nodules 07/31/2019   Change in voice 09/01/2019   Expiratory stridor 09/01/2019   Choking 09/01/2019   Abnormal CT scan, neck 09/01/2019   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   Bilateral hand swelling 01/05/2020   Plantar fasciitis 01/05/2020   Seasonal allergies 11/29/2020   Benign prostatic hyperplasia with urinary frequency 04/08/2021   Diminished pulses in lower extremity 08/26/2021   Elevated fasting glucose 08/27/2021   Moderate asthma with exacerbation 11/25/2021   Aortic atherosclerosis (HCC) 02/02/2022   Coronary artery disease involving native heart without angina pectoris 02/02/2022   Pre-diabetes 09/02/2022   Toe hematoma, right, initial encounter 01/04/2023   Tinea cruris 01/04/2023   Resolved Ambulatory Problems    Diagnosis Date Noted   SOB (shortness of breath) 10/18/2012   BPH (benign prostatic hyperplasia) 11/21/2013   Pneumonia of left lower lobe due to infectious organism 04/03/2019   Past Medical History:  Diagnosis Date   Allergy    Arthritis    COPD (chronic obstructive pulmonary disease) (HCC)  Hypertension    Stroke (HCC)      ROS See HPI.    Objective:     BP (!) 142/86 (BP Location: Right Arm, Patient Position: Sitting, Cuff Size: Large)   Pulse 90   Resp 17   Wt 179 lb (81.2 kg)   SpO2 96%   BMI 26.43 kg/m  BP Readings from Last 3 Encounters:  01/04/23 (!) 142/86  09/01/22 (!) 139/59  03/10/22 (!) 149/78   Wt Readings from Last 3 Encounters:  01/04/23  179 lb (81.2 kg)  09/01/22 176 lb (79.8 kg)  03/10/22 179 lb 14.4 oz (81.6 kg)      Physical Exam Constitutional:      Appearance: Normal appearance.  HENT:     Head: Normocephalic.  Cardiovascular:     Rate and Rhythm: Normal rate and regular rhythm.     Pulses: Normal pulses.  Pulmonary:     Effort: Pulmonary effort is normal.     Breath sounds: Normal breath sounds.  Musculoskeletal:     Right lower leg: No edema.     Left lower leg: No edema.  Skin:    Comments: Great right toe lateral side hematoma with sore on the tip 1+pedal pulses  Neurological:     General: No focal deficit present.     Mental Status: He is alert and oriented to person, place, and time.  Psychiatric:        Mood and Affect: Mood normal.       Results for orders placed or performed in visit on 01/04/23  POCT HgB A1C  Result Value Ref Range   Hemoglobin A1C     HbA1c POC (<> result, manual entry)     HbA1c, POC (prediabetic range) 6.0 5.7 - 6.4 %   HbA1c, POC (controlled diabetic range)         Assessment & Plan:  Marland KitchenMarland KitchenBob Barajas was seen today for dark spot on left toe.  Diagnoses and all orders for this visit:  Pre-diabetes -     POCT HgB A1C  COPD, moderate (HCC) -     albuterol (VENTOLIN HFA) 108 (90 Base) MCG/ACT inhaler; Inhale 2 puffs into the lungs every 6 (six) hours as needed. -     ipratropium-albuterol (DUONEB) 0.5-2.5 (3) MG/3ML SOLN; Take 3 mLs by nebulization every 6 (six) hours as needed.  Anxiety -     ALPRAZolam (XANAX) 0.5 MG tablet; Take 1 tablet (0.5 mg total) by mouth 2 (two) times daily as needed. for anxiety  Wheezing -     budesonide-formoterol (SYMBICORT) 160-4.5 MCG/ACT inhaler; Inhale 2 puffs into the lungs 2 (two) times daily.  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  Acute pain of left shoulder  BPH without urinary  obstruction -     doxazosin (CARDURA) 4 MG tablet; Take 1 tablet (4 mg total) by mouth 2 (two) times daily.  Uncontrolled hypertension -     amLODipine (NORVASC) 10 MG tablet; Take 1 tablet (10 mg total) by mouth daily. TAKE 1 TABLET BY MOUTH ONCE DAILY -     metoprolol succinate (TOPROL-XL) 100 MG 24 hr tablet; Take 1 tablet (100 mg total) by mouth daily. Take with or immediately following a meal. -     losartan (COZAAR) 50 MG tablet; Take 1 tablet (50 mg total) by mouth daily.  Plantar fasciitis -  diclofenac Sodium (VOLTAREN) 1 % GEL; Apply 4 g topically 4 (four) times daily. To affected joint. -     Ambulatory referral to Podiatry  Generalized anxiety disorder -     escitalopram (LEXAPRO) 5 MG tablet; Take 1 tablet (5 mg total) by mouth daily.  Lower extremity edema -     furosemide (LASIX) 20 MG tablet; Take 1 tablet (20 mg total) by mouth daily.  Seasonal allergies -     levocetirizine (XYZAL) 5 MG tablet; Take 1 tablet (5 mg total) by mouth daily. -     montelukast (SINGULAIR) 10 MG tablet; Take 1 tablet (10 mg total) by mouth at bedtime.  History of stroke -     losartan (COZAAR) 50 MG tablet; Take 1 tablet (50 mg total) by mouth daily.  Trouble in sleeping -     traZODone (DESYREL) 100 MG tablet; TAKE 2 TABLETS BY MOUTH AT BEDTIMe.  Toe hematoma, right, initial encounter -     Ambulatory referral to Podiatry  Tinea cruris -     fluconazole (DIFLUCAN) 150 MG tablet; Take 1 tablet (150 mg total) by mouth once for 1 dose. -     clotrimazole-betamethasone (LOTRISONE) cream; Apply 1 Application topically daily. For jock itch.  PVD (peripheral vascular disease) with claudication (HCC) -     Ambulatory referral to Podiatry  Dyslipidemia -     rosuvastatin (CRESTOR) 20 MG tablet; Take 1 tablet (20 mg total) by mouth daily.  Sore on toe -     cephALEXin (KEFLEX) 500 MG capsule; Take 1 capsule (500 mg total) by mouth 2 (two) times daily.   Due to PVD and pre-diabetes  higher risk of non healing foot lesions Pulse intact today Podiatry referral made Keflex for concern for infection Hematoma will take time to heal, discussed with patient Wear loose fitting shoes not to rub area Continue epson water soaks  Tinea start diflucan once and then use cream daily as needed for 2-3 weeks Keep area dry  A1c is trending up Discussed DM diet and encouraged exercise  BP better on 2nd check and under 150/90.  Follow up in 3 months  Refilled medications needed for prevention Declines all vaccines Needs medicare wellness exam, please schedule    Return in about 3 months (around 04/06/2023) for Follow up.    [name], PA-C

## 2023-01-06 ENCOUNTER — Encounter: Payer: Self-pay | Admitting: Physician Assistant

## 2023-01-11 DIAGNOSIS — N5239 Other post-surgical erectile dysfunction: Secondary | ICD-10-CM | POA: Diagnosis not present

## 2023-01-28 DIAGNOSIS — N5203 Combined arterial insufficiency and corporo-venous occlusive erectile dysfunction: Secondary | ICD-10-CM | POA: Diagnosis not present

## 2023-01-29 DIAGNOSIS — H2512 Age-related nuclear cataract, left eye: Secondary | ICD-10-CM | POA: Diagnosis not present

## 2023-01-29 DIAGNOSIS — H2511 Age-related nuclear cataract, right eye: Secondary | ICD-10-CM | POA: Diagnosis not present

## 2023-02-02 ENCOUNTER — Other Ambulatory Visit: Payer: Self-pay | Admitting: Physician Assistant

## 2023-02-02 DIAGNOSIS — E782 Mixed hyperlipidemia: Secondary | ICD-10-CM

## 2023-02-02 DIAGNOSIS — I739 Peripheral vascular disease, unspecified: Secondary | ICD-10-CM

## 2023-02-02 DIAGNOSIS — I2510 Atherosclerotic heart disease of native coronary artery without angina pectoris: Secondary | ICD-10-CM

## 2023-02-02 DIAGNOSIS — I700 Atherosclerosis of aorta: Secondary | ICD-10-CM

## 2023-02-11 DIAGNOSIS — E782 Mixed hyperlipidemia: Secondary | ICD-10-CM | POA: Diagnosis not present

## 2023-02-11 DIAGNOSIS — Z8673 Personal history of transient ischemic attack (TIA), and cerebral infarction without residual deficits: Secondary | ICD-10-CM | POA: Diagnosis not present

## 2023-02-11 DIAGNOSIS — H25011 Cortical age-related cataract, right eye: Secondary | ICD-10-CM | POA: Diagnosis not present

## 2023-02-11 DIAGNOSIS — H2511 Age-related nuclear cataract, right eye: Secondary | ICD-10-CM | POA: Diagnosis not present

## 2023-02-11 DIAGNOSIS — I351 Nonrheumatic aortic (valve) insufficiency: Secondary | ICD-10-CM | POA: Diagnosis not present

## 2023-02-11 DIAGNOSIS — I350 Nonrheumatic aortic (valve) stenosis: Secondary | ICD-10-CM | POA: Diagnosis not present

## 2023-02-12 DIAGNOSIS — H25041 Posterior subcapsular polar age-related cataract, right eye: Secondary | ICD-10-CM | POA: Diagnosis not present

## 2023-02-12 DIAGNOSIS — H25011 Cortical age-related cataract, right eye: Secondary | ICD-10-CM | POA: Diagnosis not present

## 2023-02-12 DIAGNOSIS — H2511 Age-related nuclear cataract, right eye: Secondary | ICD-10-CM | POA: Diagnosis not present

## 2023-02-17 DIAGNOSIS — Z960 Presence of urogenital implants: Secondary | ICD-10-CM | POA: Diagnosis not present

## 2023-02-17 DIAGNOSIS — N5239 Other post-surgical erectile dysfunction: Secondary | ICD-10-CM | POA: Diagnosis not present

## 2023-02-19 ENCOUNTER — Encounter: Payer: Self-pay | Admitting: Physician Assistant

## 2023-02-19 ENCOUNTER — Ambulatory Visit (INDEPENDENT_AMBULATORY_CARE_PROVIDER_SITE_OTHER): Payer: Medicare HMO | Admitting: Physician Assistant

## 2023-02-19 VITALS — BP 135/52 | HR 53 | Ht 69.0 in | Wt 178.0 lb

## 2023-02-19 DIAGNOSIS — M7021 Olecranon bursitis, right elbow: Secondary | ICD-10-CM | POA: Diagnosis not present

## 2023-02-19 MED ORDER — MELOXICAM 15 MG PO TABS: 15.0000 mg | ORAL_TABLET | Freq: Every day | ORAL | 0 refills | Status: DC

## 2023-02-19 NOTE — Progress Notes (Signed)
Acute Office Visit  Subjective:     Patient ID: Travis Barajas, male    DOB: 08-25-1950, 72 y.o.   MRN: [id number]  Chief Complaint  Patient presents with   Elbow Pain    HPI Patient is in today for swollen right elbow that started 2 days ago. He denies any trauma or pain. He does not have any redness or swelling. Not tried anything to make better.   .. Active Ambulatory Problems    Diagnosis Date Noted   GERD (gastroesophageal reflux disease) 07/23/2011   Allergic rhinitis 07/23/2011   Joint pain 07/23/2011   HTN (hypertension), malignant 10/01/2011   Tobacco user 12/05/2011   Hematuria 11/07/2013   Hyperlipidemia 11/17/2013   COPD, moderate (HCC) 03/21/2014   Erectile dysfunction 03/21/2014   Claudication (HCC) 09/26/2014   Generalized anxiety disorder 01/15/2015   BPH without urinary obstruction 05/25/2016   Acute nonseasonal allergic rhinitis due to pollen 05/04/2017   Dyslipidemia 05/04/2017   Current smoker 09/01/2017   Hemoptysis 09/01/2017   Left carotid bruit 09/01/2017   Systolic murmur 09/01/2017   Trouble in sleeping 10/05/2018   PVD (peripheral vascular disease) with claudication (HCC) 02/22/2019   Thrombocytopenia (HCC) 02/24/2019   Slurred speech 02/24/2019   Dizziness 02/24/2019   Dysphagia 02/24/2019   TIA (transient ischemic attack) 02/24/2019   Intercostal retractions 03/31/2019   Cerebrovascular accident (HCC) 04/03/2019   Distended abdomen 04/14/2019   Primary hypertension 04/14/2019   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HCC) 04/26/2019   Renal cyst, left 04/26/2019   Gallstone 04/26/2019   Lower extremity edema 06/15/2019   Anxiety 06/15/2019   Wheezing 07/24/2019   COPD with acute exacerbation (HCC) 07/24/2019   Left thigh pain 07/24/2019   Acute left-sided low back pain with left-sided sciatica 07/24/2019   Pulmonary nodules 07/31/2019   Change in voice 09/01/2019   Expiratory stridor 09/01/2019   Choking 09/01/2019   Abnormal CT scan,  neck 09/01/2019   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   Bilateral hand swelling 01/05/2020   Plantar fasciitis 01/05/2020   Seasonal allergies 11/29/2020   Benign prostatic hyperplasia with urinary frequency 04/08/2021   Diminished pulses in lower extremity 08/26/2021   Elevated fasting glucose 08/27/2021   Moderate asthma with exacerbation 11/25/2021   Aortic atherosclerosis (HCC) 02/02/2022   Coronary artery disease involving native heart without angina pectoris 02/02/2022   Pre-diabetes 09/02/2022   Toe hematoma, right, initial encounter 01/04/2023   Tinea cruris 01/04/2023   Resolved Ambulatory Problems    Diagnosis Date Noted   SOB (shortness of breath) 10/18/2012   BPH (benign prostatic hyperplasia) 11/21/2013   Pneumonia of left lower lobe due to infectious organism 04/03/2019   Past Medical History:  Diagnosis Date   Allergy    Arthritis    COPD (chronic obstructive pulmonary disease) (HCC)    Hypertension    Stroke (HCC)      ROS  See HPI.     Objective:    BP (!) 135/52   Pulse (!) 53   Ht 5\' 9"  (1.753 m)   Wt 178 lb (80.7 kg)   SpO2 97%   BMI 26.29 kg/m  BP Readings from Last 3 Encounters:  02/19/23 (!) 135/52  01/04/23 (!) 142/86  09/01/22 (!) 139/59   Wt Readings from Last 3 Encounters:  02/19/23 178 lb (80.7 kg)  01/04/23 179 lb (81.2 kg)  09/01/22 176 lb (79.8 kg)      Physical Exam Right swollen olecranon bursa with no warmth, tenderness, redness.  Assessment & Plan:  Marland KitchenMarland KitchenJacson Barajas was seen today for elbow pain.  Diagnoses and all orders for this visit:  Olecranon bursitis of right elbow -     meloxicam (MOBIC) 15 MG tablet; Take 1 tablet (15 mg total) by mouth daily.   Agreed to try compression and icing for next week with mobic daily and if not better come back to have drained by Dr. Karie Schwalbe.  No signs of infection today.     Return if symptoms worsen or fail to improve.  Travis Gaw, PA-C

## 2023-02-19 NOTE — Progress Notes (Signed)
Pt reports that 2 days ago he awakened to his R elbow being in pain. He denies any injury or trauma.  The area appears to be olecranon bursitis. It is

## 2023-02-19 NOTE — Patient Instructions (Signed)
Ice and compress Take mobic for next 1-2 weeks with food in the morning If not improving or worsening come back to have fluid drained with Dr. Karie Schwalbe.   Elbow Bursitis  Elbow bursitis is the swelling of the fluid-filled sac (bursa) at the tip of the elbow. A bursa is like a cushion that protects the joint. If the bursa becomes irritated, it can fill with extra fluid and become swollen. What are the causes? Injury to the elbow. Leaning the elbow on a hard surface for a long time. Infection. Bone spurs. Certain conditions that cause swelling. Sometimes, the cause is not known. What are the signs or symptoms? The first sign of this condition is often swelling at the tip of the elbow. The swelling can grow to the size of a golf ball. Other symptoms include: Pain when bending or leaning on the elbow. Stiffness of the elbow. If the cause is infection, you may have: Redness, warmth, and tenderness. Pus coming from a cut near the elbow. How is this treated? Treatment depends on the cause. It may include: Medicines. Draining fluid from the bursa. Placing a bandage or pressure (compression) sleeve around the elbow. Wearing elbow pads. Surgery, if other treatments do not help. Follow these instructions at home: Medicines Take over-the-counter and prescription medicines only as told by your doctor. If you were prescribed an antibiotic medicine, take it as told by your doctor. Do not stop taking it even if you start to feel better. Managing pain, stiffness, and swelling     If told, put ice on the elbow. To do this: Put ice in a plastic bag. Place a towel between your skin and the bag. Leave the ice on for 20 minutes, 2-3 times a day. Take off the ice if your skin turns bright red. This is very important. If you cannot feel pain, heat, or cold, you have a greater risk of damage to the area. If told, put heat on the affected area. Do this as often as told by your doctor. Use the heat source  that your doctor recommends, such as a moist heat pack or a heating pad. Place a towel between your skin and the heat source. Leave the heat on for 20-30 minutes. Take off the heat if your skin turns bright red. This is very important. If you cannot feel pain, heat, or cold, you have a greater risk of getting burned. If your bursitis is caused by an injury, follow instructions from your doctor about: Resting your elbow. Wearing a bandage or sleeve. Wear elbow pads or elbow wraps as needed. These help cushion your elbow. General instructions Avoid any activities that cause elbow pain. Ask your doctor what activities are safe for you. Keep all follow-up visits. Contact a doctor if: You have a fever. You have problems that do not get better with treatment. You have pain or swelling that: Gets worse. Goes away and then comes back. You have pus draining from your elbow. You have redness around the elbow area. Your elbow feels warm to the touch. Get help right away if: You have trouble moving your arm, hand, or fingers. Summary Elbow bursitis is the swelling of the fluid-filled sac (bursa) at the tip of the elbow. You may need to take medicine or put ice on your elbow. Contact your doctor if your problems do not get better with treatment. Also, contact your doctor if your problems go away and then come back. This information is not intended to replace advice  given to you by your health care provider. Make sure you discuss any questions you have with your health care provider. Document Revised: 05/27/2021 Document Reviewed: 05/27/2021 Elsevier Patient Education  2024 [employer].

## 2023-03-05 ENCOUNTER — Ambulatory Visit: Payer: Medicare HMO | Admitting: Physician Assistant

## 2023-03-05 DIAGNOSIS — I517 Cardiomegaly: Secondary | ICD-10-CM | POA: Diagnosis not present

## 2023-03-05 DIAGNOSIS — I352 Nonrheumatic aortic (valve) stenosis with insufficiency: Secondary | ICD-10-CM | POA: Diagnosis not present

## 2023-03-08 ENCOUNTER — Other Ambulatory Visit: Payer: Self-pay | Admitting: Physician Assistant

## 2023-03-08 DIAGNOSIS — Z8673 Personal history of transient ischemic attack (TIA), and cerebral infarction without residual deficits: Secondary | ICD-10-CM

## 2023-03-08 DIAGNOSIS — I10 Essential (primary) hypertension: Secondary | ICD-10-CM

## 2023-03-17 ENCOUNTER — Other Ambulatory Visit: Payer: Self-pay | Admitting: Physician Assistant

## 2023-03-17 DIAGNOSIS — M7021 Olecranon bursitis, right elbow: Secondary | ICD-10-CM

## 2023-03-18 DIAGNOSIS — N5239 Other post-surgical erectile dysfunction: Secondary | ICD-10-CM | POA: Diagnosis not present

## 2023-04-12 ENCOUNTER — Ambulatory Visit (INDEPENDENT_AMBULATORY_CARE_PROVIDER_SITE_OTHER): Payer: Medicare HMO | Admitting: Physician Assistant

## 2023-04-12 ENCOUNTER — Encounter: Payer: Self-pay | Admitting: Physician Assistant

## 2023-04-12 VITALS — BP 171/60 | HR 61 | Ht 64.5 in | Wt 179.5 lb

## 2023-04-12 DIAGNOSIS — E782 Mixed hyperlipidemia: Secondary | ICD-10-CM

## 2023-04-12 DIAGNOSIS — J449 Chronic obstructive pulmonary disease, unspecified: Secondary | ICD-10-CM

## 2023-04-12 DIAGNOSIS — I77811 Abdominal aortic ectasia: Secondary | ICD-10-CM

## 2023-04-12 DIAGNOSIS — R7303 Prediabetes: Secondary | ICD-10-CM

## 2023-04-12 DIAGNOSIS — I10 Essential (primary) hypertension: Secondary | ICD-10-CM

## 2023-04-12 DIAGNOSIS — I2510 Atherosclerotic heart disease of native coronary artery without angina pectoris: Secondary | ICD-10-CM | POA: Diagnosis not present

## 2023-04-12 DIAGNOSIS — I700 Atherosclerosis of aorta: Secondary | ICD-10-CM | POA: Diagnosis not present

## 2023-04-12 LAB — POCT GLYCOSYLATED HEMOGLOBIN (HGB A1C): Hemoglobin A1C: 6 % — AB (ref 4.0–5.6)

## 2023-04-12 NOTE — Progress Notes (Signed)
Established Patient Office Visit  Subjective   Patient ID: Travis Barajas, male    DOB: 1950/10/10  Age: 72 y.o. MRN: [id number]  Chief Complaint  Patient presents with   Diabetes    Last A1C 6.0 on 01/04/2023- cardiologist requesting lipid panel be drawn.    HPI Pt is a 72 yo male with HTN, HLD, CAD with CVA history, T2DM, ASthma who presents to the clinic for follow up. His wife accompanies him.   He is doing well today. He does not check his sugars. He is active. He denies any hypoglycemic events. He has not had any SOB, CP, palpitations, headaches or vision changes. He is controlling his sugars with diet right now. He feels good.    Active Ambulatory Problems    Diagnosis Date Noted   GERD (gastroesophageal reflux disease) 07/23/2011   Allergic rhinitis 07/23/2011   Joint pain 07/23/2011   HTN (hypertension), malignant 10/01/2011   Tobacco user 12/05/2011   Hematuria 11/07/2013   Hyperlipidemia 11/17/2013   COPD, moderate (HCC) 03/21/2014   Erectile dysfunction 03/21/2014   Claudication (HCC) 09/26/2014   Generalized anxiety disorder 01/15/2015   BPH without urinary obstruction 05/25/2016   Acute nonseasonal allergic rhinitis due to pollen 05/04/2017   Dyslipidemia 05/04/2017   Current smoker 09/01/2017   Hemoptysis 09/01/2017   Left carotid bruit 09/01/2017   Systolic murmur 09/01/2017   Trouble in sleeping 10/05/2018   PVD (peripheral vascular disease) with claudication (HCC) 02/22/2019   Thrombocytopenia (HCC) 02/24/2019   Slurred speech 02/24/2019   Dizziness 02/24/2019   Dysphagia 02/24/2019   TIA (transient ischemic attack) 02/24/2019   Intercostal retractions 03/31/2019   Cerebrovascular accident (HCC) 04/03/2019   Distended abdomen 04/14/2019   Primary hypertension 04/14/2019   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HCC) 04/26/2019   Renal cyst, left 04/26/2019   Gallstone 04/26/2019   Lower extremity edema 06/15/2019   Anxiety 06/15/2019   Wheezing  07/24/2019   COPD with acute exacerbation (HCC) 07/24/2019   Left thigh pain 07/24/2019   Acute left-sided low back pain with left-sided sciatica 07/24/2019   Pulmonary nodules 07/31/2019   Change in voice 09/01/2019   Expiratory stridor 09/01/2019   Choking 09/01/2019   Abnormal CT scan, neck 09/01/2019   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   Bilateral hand swelling 01/05/2020   Plantar fasciitis 01/05/2020   Seasonal allergies 11/29/2020   Benign prostatic hyperplasia with urinary frequency 04/08/2021   Diminished pulses in lower extremity 08/26/2021   Elevated fasting glucose 08/27/2021   Moderate asthma with exacerbation 11/25/2021   Aortic atherosclerosis (HCC) 02/02/2022   Coronary artery disease involving native heart without angina pectoris 02/02/2022   Toe hematoma, right, initial encounter 01/04/2023   Tinea cruris 01/04/2023   Pre-diabetes 04/20/2023   Resolved Ambulatory Problems    Diagnosis Date Noted   SOB (shortness of breath) 10/18/2012   BPH (benign prostatic hyperplasia) 11/21/2013   Pneumonia of left lower lobe due to infectious organism 04/03/2019   Past Medical History:  Diagnosis Date   Allergy    Arthritis    COPD (chronic obstructive pulmonary disease) (HCC)    Hypertension    Stroke (HCC)     ROS See HPI.    Objective:     BP (!) 171/60   Pulse 61   Ht 5' 4.5" (1.638 m)   Wt 179 lb 8 oz (81.4 kg)   SpO2 98%   BMI 30.34 kg/m  BP Readings from Last 3 Encounters:  04/12/23 (!) 171/60  02/19/23 Marland Kitchen)  135/52  01/04/23 (!) 142/86   Wt Readings from Last 3 Encounters:  04/12/23 179 lb 8 oz (81.4 kg)  02/19/23 178 lb (80.7 kg)  01/04/23 179 lb (81.2 kg)      Physical Exam Constitutional:      Appearance: Normal appearance.  HENT:     Head: Normocephalic.  Cardiovascular:     Rate and Rhythm: Normal rate and regular rhythm.     Heart sounds: Murmur heard.  Pulmonary:     Effort: Pulmonary effort is normal.     Breath sounds: Normal  breath sounds.  Musculoskeletal:     Cervical back: Normal range of motion and neck supple. No tenderness.  Lymphadenopathy:     Cervical: No cervical adenopathy.  Neurological:     General: No focal deficit present.     Mental Status: He is alert and oriented to person, place, and time.  Psychiatric:        Mood and Affect: Mood normal.      Assessment & Plan:  Marland KitchenMarland KitchenJaquavian Barajas was seen today for diabetes.  Diagnoses and all orders for this visit:  Pre-diabetes -     POCT HgB A1C -     Lipid panel -     CMP14+EGFR  Mixed hyperlipidemia -     Lipid panel  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HCC) -     Lipid panel  HTN (hypertension), malignant -     CMP14+EGFR  Aortic atherosclerosis (HCC)  Coronary artery disease involving native heart without angina pectoris, unspecified vessel or lesion type  COPD, moderate (HCC)   A1C 6 today and controlled Continue DM diet and exercise BP is elevated today and out of characteristic for patient Start checking at home and recheck in 2 weeks nurse visit On statin Will recheck labs today Declines covid/flu/shingles/pneumonia vaccines Follow up in 3 months  [name], PA-C

## 2023-04-13 ENCOUNTER — Encounter: Payer: Self-pay | Admitting: Physician Assistant

## 2023-04-13 LAB — CMP14+EGFR
ALT: 13 [IU]/L (ref 0–44)
AST: 19 [IU]/L (ref 0–40)
Albumin: 4.4 g/dL (ref 3.8–4.8)
Alkaline Phosphatase: 120 [IU]/L (ref 44–121)
BUN/Creatinine Ratio: 10 (ref 10–24)
BUN: 9 mg/dL (ref 8–27)
Bilirubin Total: 0.5 mg/dL (ref 0.0–1.2)
CO2: 25 mmol/L (ref 20–29)
Calcium: 9.1 mg/dL (ref 8.6–10.2)
Chloride: 102 mmol/L (ref 96–106)
Creatinine, Ser: 0.91 mg/dL (ref 0.76–1.27)
Globulin, Total: 2.8 g/dL (ref 1.5–4.5)
Glucose: 88 mg/dL (ref 70–99)
Potassium: 3.8 mmol/L (ref 3.5–5.2)
Sodium: 143 mmol/L (ref 134–144)
Total Protein: 7.2 g/dL (ref 6.0–8.5)
eGFR: 90 mL/min/{1.73_m2} (ref >59)

## 2023-04-13 LAB — LIPID PANEL
Chol/HDL Ratio: 3.5 ratio (ref 0.0–5.0)
Cholesterol, Total: 168 mg/dL (ref 100–199)
HDL: 48 mg/dL (ref >39)
LDL Chol Calc (NIH): 100 mg/dL — ABNORMAL HIGH (ref 0–99)
Triglycerides: 112 mg/dL (ref 0–149)
VLDL Cholesterol Cal: 20 mg/dL (ref 5–40)

## 2023-04-13 NOTE — Progress Notes (Signed)
Travis Barajas,   Kidney, liver, glucose look great.  LDL good but not to goal. Are you taking crestor daily?

## 2023-04-16 ENCOUNTER — Other Ambulatory Visit: Payer: Self-pay | Admitting: Physician Assistant

## 2023-04-16 DIAGNOSIS — M7021 Olecranon bursitis, right elbow: Secondary | ICD-10-CM

## 2023-04-20 DIAGNOSIS — Z01818 Encounter for other preprocedural examination: Secondary | ICD-10-CM | POA: Diagnosis not present

## 2023-04-20 DIAGNOSIS — R7303 Prediabetes: Secondary | ICD-10-CM | POA: Insufficient documentation

## 2023-04-27 DIAGNOSIS — T83410A Breakdown (mechanical) of penile (implanted) prosthesis, initial encounter: Secondary | ICD-10-CM | POA: Diagnosis not present

## 2023-04-27 DIAGNOSIS — D696 Thrombocytopenia, unspecified: Secondary | ICD-10-CM | POA: Diagnosis not present

## 2023-04-27 DIAGNOSIS — Z7902 Long term (current) use of antithrombotics/antiplatelets: Secondary | ICD-10-CM | POA: Diagnosis not present

## 2023-04-27 DIAGNOSIS — I350 Nonrheumatic aortic (valve) stenosis: Secondary | ICD-10-CM | POA: Diagnosis not present

## 2023-04-27 DIAGNOSIS — N529 Male erectile dysfunction, unspecified: Secondary | ICD-10-CM | POA: Diagnosis not present

## 2023-04-27 DIAGNOSIS — Z8673 Personal history of transient ischemic attack (TIA), and cerebral infarction without residual deficits: Secondary | ICD-10-CM | POA: Diagnosis not present

## 2023-04-27 DIAGNOSIS — N281 Cyst of kidney, acquired: Secondary | ICD-10-CM | POA: Diagnosis not present

## 2023-04-27 DIAGNOSIS — K219 Gastro-esophageal reflux disease without esophagitis: Secondary | ICD-10-CM | POA: Diagnosis not present

## 2023-04-27 DIAGNOSIS — I700 Atherosclerosis of aorta: Secondary | ICD-10-CM | POA: Diagnosis not present

## 2023-04-27 DIAGNOSIS — Z888 Allergy status to other drugs, medicaments and biological substances status: Secondary | ICD-10-CM | POA: Diagnosis not present

## 2023-04-27 DIAGNOSIS — I351 Nonrheumatic aortic (valve) insufficiency: Secondary | ICD-10-CM | POA: Diagnosis not present

## 2023-04-27 DIAGNOSIS — N486 Induration penis plastica: Secondary | ICD-10-CM | POA: Diagnosis not present

## 2023-04-27 DIAGNOSIS — L905 Scar conditions and fibrosis of skin: Secondary | ICD-10-CM | POA: Diagnosis not present

## 2023-04-27 DIAGNOSIS — R011 Cardiac murmur, unspecified: Secondary | ICD-10-CM | POA: Diagnosis not present

## 2023-04-27 DIAGNOSIS — I10 Essential (primary) hypertension: Secondary | ICD-10-CM | POA: Diagnosis not present

## 2023-04-27 DIAGNOSIS — I77811 Abdominal aortic ectasia: Secondary | ICD-10-CM | POA: Diagnosis not present

## 2023-04-27 DIAGNOSIS — T83490A Other mechanical complication of penile (implanted) prosthesis, initial encounter: Secondary | ICD-10-CM | POA: Diagnosis not present

## 2023-04-27 DIAGNOSIS — Z79899 Other long term (current) drug therapy: Secondary | ICD-10-CM | POA: Diagnosis not present

## 2023-04-27 DIAGNOSIS — Z87891 Personal history of nicotine dependence: Secondary | ICD-10-CM | POA: Diagnosis not present

## 2023-04-27 DIAGNOSIS — J4489 Other specified chronic obstructive pulmonary disease: Secondary | ICD-10-CM | POA: Diagnosis not present

## 2023-04-27 DIAGNOSIS — Y838 Other surgical procedures as the cause of abnormal reaction of the patient, or of later complication, without mention of misadventure at the time of the procedure: Secondary | ICD-10-CM | POA: Diagnosis not present

## 2023-04-28 DIAGNOSIS — R011 Cardiac murmur, unspecified: Secondary | ICD-10-CM | POA: Diagnosis not present

## 2023-04-28 DIAGNOSIS — N486 Induration penis plastica: Secondary | ICD-10-CM | POA: Diagnosis not present

## 2023-04-28 DIAGNOSIS — I351 Nonrheumatic aortic (valve) insufficiency: Secondary | ICD-10-CM | POA: Diagnosis not present

## 2023-04-28 DIAGNOSIS — D696 Thrombocytopenia, unspecified: Secondary | ICD-10-CM | POA: Diagnosis not present

## 2023-04-28 DIAGNOSIS — Z8673 Personal history of transient ischemic attack (TIA), and cerebral infarction without residual deficits: Secondary | ICD-10-CM | POA: Diagnosis not present

## 2023-04-28 DIAGNOSIS — Z888 Allergy status to other drugs, medicaments and biological substances status: Secondary | ICD-10-CM | POA: Diagnosis not present

## 2023-04-28 DIAGNOSIS — N529 Male erectile dysfunction, unspecified: Secondary | ICD-10-CM | POA: Diagnosis not present

## 2023-04-28 DIAGNOSIS — T83410A Breakdown (mechanical) of penile (implanted) prosthesis, initial encounter: Secondary | ICD-10-CM | POA: Diagnosis not present

## 2023-04-28 DIAGNOSIS — Y838 Other surgical procedures as the cause of abnormal reaction of the patient, or of later complication, without mention of misadventure at the time of the procedure: Secondary | ICD-10-CM | POA: Diagnosis not present

## 2023-04-28 DIAGNOSIS — I350 Nonrheumatic aortic (valve) stenosis: Secondary | ICD-10-CM | POA: Diagnosis not present

## 2023-04-28 DIAGNOSIS — Z87891 Personal history of nicotine dependence: Secondary | ICD-10-CM | POA: Diagnosis not present

## 2023-04-28 DIAGNOSIS — Z7902 Long term (current) use of antithrombotics/antiplatelets: Secondary | ICD-10-CM | POA: Diagnosis not present

## 2023-04-28 DIAGNOSIS — J4489 Other specified chronic obstructive pulmonary disease: Secondary | ICD-10-CM | POA: Diagnosis not present

## 2023-04-28 DIAGNOSIS — N281 Cyst of kidney, acquired: Secondary | ICD-10-CM | POA: Diagnosis not present

## 2023-04-28 DIAGNOSIS — I700 Atherosclerosis of aorta: Secondary | ICD-10-CM | POA: Diagnosis not present

## 2023-04-28 DIAGNOSIS — I10 Essential (primary) hypertension: Secondary | ICD-10-CM | POA: Diagnosis not present

## 2023-04-28 DIAGNOSIS — K219 Gastro-esophageal reflux disease without esophagitis: Secondary | ICD-10-CM | POA: Diagnosis not present

## 2023-04-28 DIAGNOSIS — Z79899 Other long term (current) drug therapy: Secondary | ICD-10-CM | POA: Diagnosis not present

## 2023-04-28 DIAGNOSIS — T83490A Other mechanical complication of penile (implanted) prosthesis, initial encounter: Secondary | ICD-10-CM | POA: Diagnosis not present

## 2023-04-28 DIAGNOSIS — I77811 Abdominal aortic ectasia: Secondary | ICD-10-CM | POA: Diagnosis not present

## 2023-05-12 DIAGNOSIS — N5239 Other post-surgical erectile dysfunction: Secondary | ICD-10-CM | POA: Diagnosis not present

## 2023-05-15 ENCOUNTER — Other Ambulatory Visit: Payer: Self-pay | Admitting: Physician Assistant

## 2023-05-15 DIAGNOSIS — M7021 Olecranon bursitis, right elbow: Secondary | ICD-10-CM

## 2023-06-04 ENCOUNTER — Other Ambulatory Visit: Payer: Self-pay | Admitting: Physician Assistant

## 2023-06-04 DIAGNOSIS — Z8673 Personal history of transient ischemic attack (TIA), and cerebral infarction without residual deficits: Secondary | ICD-10-CM

## 2023-06-04 DIAGNOSIS — I10 Essential (primary) hypertension: Secondary | ICD-10-CM

## 2023-06-15 ENCOUNTER — Other Ambulatory Visit: Payer: Self-pay | Admitting: Physician Assistant

## 2023-06-15 DIAGNOSIS — M7021 Olecranon bursitis, right elbow: Secondary | ICD-10-CM

## 2023-06-24 ENCOUNTER — Other Ambulatory Visit: Payer: Self-pay | Admitting: Physician Assistant

## 2023-06-24 DIAGNOSIS — F419 Anxiety disorder, unspecified: Secondary | ICD-10-CM

## 2023-07-01 DIAGNOSIS — N5201 Erectile dysfunction due to arterial insufficiency: Secondary | ICD-10-CM | POA: Diagnosis not present

## 2023-07-22 ENCOUNTER — Other Ambulatory Visit: Payer: Self-pay | Admitting: Physician Assistant

## 2023-07-22 DIAGNOSIS — I6339 Cerebral infarction due to thrombosis of other cerebral artery: Secondary | ICD-10-CM

## 2023-07-22 DIAGNOSIS — R600 Localized edema: Secondary | ICD-10-CM

## 2023-07-22 DIAGNOSIS — N5201 Erectile dysfunction due to arterial insufficiency: Secondary | ICD-10-CM | POA: Diagnosis not present

## 2023-07-22 NOTE — Telephone Encounter (Signed)
 Pls contact pt to schedule BP NV past due since January. Sending medication refills. Thx

## 2023-07-22 NOTE — Telephone Encounter (Signed)
 Called patient, patient's spouse states he will call office back to schedule appt, thanks.

## 2023-07-29 ENCOUNTER — Other Ambulatory Visit: Payer: Self-pay | Admitting: Physician Assistant

## 2023-07-29 DIAGNOSIS — F419 Anxiety disorder, unspecified: Secondary | ICD-10-CM

## 2023-08-08 ENCOUNTER — Other Ambulatory Visit: Payer: Self-pay | Admitting: Physician Assistant

## 2023-08-08 DIAGNOSIS — J449 Chronic obstructive pulmonary disease, unspecified: Secondary | ICD-10-CM

## 2023-08-17 ENCOUNTER — Ambulatory Visit (INDEPENDENT_AMBULATORY_CARE_PROVIDER_SITE_OTHER): Payer: Medicare HMO | Admitting: Physician Assistant

## 2023-08-17 ENCOUNTER — Encounter: Payer: Self-pay | Admitting: Physician Assistant

## 2023-08-17 VITALS — BP 140/75

## 2023-08-17 DIAGNOSIS — E785 Hyperlipidemia, unspecified: Secondary | ICD-10-CM | POA: Diagnosis not present

## 2023-08-17 DIAGNOSIS — F411 Generalized anxiety disorder: Secondary | ICD-10-CM | POA: Diagnosis not present

## 2023-08-17 DIAGNOSIS — I6339 Cerebral infarction due to thrombosis of other cerebral artery: Secondary | ICD-10-CM

## 2023-08-17 DIAGNOSIS — J302 Other seasonal allergic rhinitis: Secondary | ICD-10-CM

## 2023-08-17 DIAGNOSIS — N400 Enlarged prostate without lower urinary tract symptoms: Secondary | ICD-10-CM

## 2023-08-17 DIAGNOSIS — I10 Essential (primary) hypertension: Secondary | ICD-10-CM | POA: Diagnosis not present

## 2023-08-17 DIAGNOSIS — R062 Wheezing: Secondary | ICD-10-CM | POA: Diagnosis not present

## 2023-08-17 DIAGNOSIS — Z87891 Personal history of nicotine dependence: Secondary | ICD-10-CM

## 2023-08-17 DIAGNOSIS — G479 Sleep disorder, unspecified: Secondary | ICD-10-CM | POA: Diagnosis not present

## 2023-08-17 DIAGNOSIS — M722 Plantar fascial fibromatosis: Secondary | ICD-10-CM

## 2023-08-17 DIAGNOSIS — B356 Tinea cruris: Secondary | ICD-10-CM

## 2023-08-17 DIAGNOSIS — J449 Chronic obstructive pulmonary disease, unspecified: Secondary | ICD-10-CM | POA: Diagnosis not present

## 2023-08-17 DIAGNOSIS — Z122 Encounter for screening for malignant neoplasm of respiratory organs: Secondary | ICD-10-CM

## 2023-08-17 DIAGNOSIS — Z8673 Personal history of transient ischemic attack (TIA), and cerebral infarction without residual deficits: Secondary | ICD-10-CM

## 2023-08-17 MED ORDER — ESCITALOPRAM OXALATE 5 MG PO TABS: 5.0000 mg | ORAL_TABLET | Freq: Every day | ORAL | 1 refills | Status: AC

## 2023-08-17 MED ORDER — TRAZODONE HCL 100 MG PO TABS: ORAL_TABLET | ORAL | 3 refills | Status: DC

## 2023-08-17 MED ORDER — MONTELUKAST SODIUM 10 MG PO TABS: 10.0000 mg | ORAL_TABLET | Freq: Every day | ORAL | 3 refills | Status: AC

## 2023-08-17 MED ORDER — METOPROLOL SUCCINATE ER 100 MG PO TB24: 100.0000 mg | ORAL_TABLET | Freq: Every day | ORAL | 1 refills | Status: DC

## 2023-08-17 MED ORDER — BUDESONIDE-FORMOTEROL FUMARATE 160-4.5 MCG/ACT IN AERO: 2.0000 | INHALATION_SPRAY | Freq: Two times a day (BID) | RESPIRATORY_TRACT | 4 refills | Status: DC

## 2023-08-17 MED ORDER — ROSUVASTATIN CALCIUM 20 MG PO TABS
20.0000 mg | ORAL_TABLET | Freq: Every day | ORAL | 1 refills | Status: AC
Start: 2023-08-17 — End: ?

## 2023-08-17 MED ORDER — CLOTRIMAZOLE-BETAMETHASONE 1-0.05 % EX CREA: 1.0000 | TOPICAL_CREAM | Freq: Every day | CUTANEOUS | 1 refills | Status: DC

## 2023-08-17 MED ORDER — IPRATROPIUM-ALBUTEROL 0.5-2.5 (3) MG/3ML IN SOLN: 3.0000 mL | Freq: Four times a day (QID) | RESPIRATORY_TRACT | 1 refills | Status: DC | PRN

## 2023-08-17 MED ORDER — DICLOFENAC SODIUM 1 % EX GEL: 4.0000 g | Freq: Four times a day (QID) | CUTANEOUS | 3 refills | Status: AC

## 2023-08-17 MED ORDER — LEVOCETIRIZINE DIHYDROCHLORIDE 5 MG PO TABS: 5.0000 mg | ORAL_TABLET | Freq: Every day | ORAL | 3 refills | Status: AC

## 2023-08-17 MED ORDER — CLOPIDOGREL BISULFATE 75 MG PO TABS: 75.0000 mg | ORAL_TABLET | Freq: Every day | ORAL | 3 refills | Status: AC

## 2023-08-17 MED ORDER — AMLODIPINE BESYLATE 10 MG PO TABS: 10.0000 mg | ORAL_TABLET | Freq: Every day | ORAL | 1 refills | Status: DC

## 2023-08-17 MED ORDER — DOXAZOSIN MESYLATE 4 MG PO TABS: 4.0000 mg | ORAL_TABLET | Freq: Two times a day (BID) | ORAL | 1 refills | Status: AC

## 2023-08-17 MED ORDER — LOSARTAN POTASSIUM 50 MG PO TABS: 50.0000 mg | ORAL_TABLET | Freq: Every day | ORAL | 1 refills | Status: DC

## 2023-08-17 NOTE — Progress Notes (Signed)
 Established Patient Office Visit  Subjective   Patient ID: Travis Barajas, male    DOB: March 24, 1951  Age: 73 y.o. MRN: [id number]  CC: medication refills  HPI Patient is a 73 yo African American obese male who presents for medication refills and follow up.   He is compliant with his HTN medications. He is not taking his blood pressure at home. His wife states she struggles getting him to take his medications. He states he sometimes will go a few days without taking some of his medications. He denies chest pains, shortness of breath, headaches, dizziness. He is not checking BP at home.   He states first thing in the morning he coughs up sputum and it is a red/brown color. He states he coughs it up and he is fine. He denies any SOB. He is not active. He does not keep to a healthy diet.  .. Active Ambulatory Problems    Diagnosis Date Noted   GERD (gastroesophageal reflux disease) 07/23/2011   Allergic rhinitis 07/23/2011   Joint pain 07/23/2011   HTN (hypertension), malignant 10/01/2011   Tobacco user 12/05/2011   Hematuria 11/07/2013   Hyperlipidemia 11/17/2013   COPD, moderate (HCC) 03/21/2014   Erectile dysfunction 03/21/2014   Claudication (HCC) 09/26/2014   Generalized anxiety disorder 01/15/2015   BPH without urinary obstruction 05/25/2016   Acute nonseasonal allergic rhinitis due to pollen 05/04/2017   Dyslipidemia 05/04/2017   Current smoker 09/01/2017   Hemoptysis 09/01/2017   Left carotid bruit 09/01/2017   Systolic murmur 09/01/2017   Trouble in sleeping 10/05/2018   PVD (peripheral vascular disease) with claudication (HCC) 02/22/2019   Thrombocytopenia (HCC) 02/24/2019   Slurred speech 02/24/2019   Dizziness 02/24/2019   Dysphagia 02/24/2019   TIA (transient ischemic attack) 02/24/2019   Intercostal retractions 03/31/2019   Cerebrovascular accident (HCC) 04/03/2019   Distended abdomen 04/14/2019   Primary hypertension 04/14/2019   Abdominal aortic ectasia  (HCC) 04/26/2019   Renal cyst, left 04/26/2019   Gallstone 04/26/2019   Lower extremity edema 06/15/2019   Anxiety 06/15/2019   Wheezing 07/24/2019   COPD with acute exacerbation (HCC) 07/24/2019   Left thigh pain 07/24/2019   Acute left-sided low back pain with left-sided sciatica 07/24/2019   Pulmonary nodules 07/31/2019   Change in voice 09/01/2019   Expiratory stridor 09/01/2019   Choking 09/01/2019   Abnormal CT scan, neck 09/01/2019   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   Bilateral hand swelling 01/05/2020   Plantar fasciitis 01/05/2020   Seasonal allergies 11/29/2020   Benign prostatic hyperplasia with urinary frequency 04/08/2021   Diminished pulses in lower extremity 08/26/2021   Elevated fasting glucose 08/27/2021   Moderate asthma with exacerbation 11/25/2021   Aortic atherosclerosis (HCC) 02/02/2022   Coronary artery disease involving native heart without angina pectoris 02/02/2022   Toe hematoma, right, initial encounter 01/04/2023   Tinea cruris 01/04/2023   Pre-diabetes 04/20/2023   History of stroke 08/23/2023   Resolved Ambulatory Problems    Diagnosis Date Noted   SOB (shortness of breath) 10/18/2012   BPH (benign prostatic hyperplasia) 11/21/2013   Pneumonia of left lower lobe due to infectious organism 04/03/2019   Past Medical History:  Diagnosis Date   Allergy    Arthritis    COPD (chronic obstructive pulmonary disease) (HCC)    Hypertension    Stroke [facility])     Review of Systems  All other systems reviewed and are negative.  Objective:    BP (!) 140/75 (BP Location: Left Arm,  Patient Position: Sitting, Cuff Size: Normal)  BP Readings from Last 3 Encounters:  08/17/23 (!) 140/75  04/12/23 (!) 171/60  02/19/23 (!) 135/52   Wt Readings from Last 3 Encounters:  04/12/23 179 lb 8 oz (81.4 kg)  02/19/23 178 lb (80.7 kg)  01/04/23 179 lb (81.2 kg)   SpO2 Readings from Last 3 Encounters:  04/12/23 98%  02/19/23 97%  01/04/23 96%   .Marland Kitchen   Physical Exam Constitutional:      Appearance: He is obese.  HENT:     Head: Normocephalic and atraumatic.  Eyes:     Extraocular Movements: Extraocular movements intact.  Cardiovascular:     Rate and Rhythm: Normal rate and regular rhythm.     Pulses: Normal pulses.     Heart sounds: Normal heart sounds.     Comments: No carotid bruits auscultated Pulmonary:     Effort: Pulmonary effort is normal.     Breath sounds: Normal breath sounds.  Musculoskeletal:        General: Normal range of motion.  Skin:    General: Skin is warm.  Neurological:     Mental Status: He is alert.  Psychiatric:        Mood and Affect: Mood normal.        Behavior: Behavior normal.    Assessment & Plan:  Marland KitchenMarland KitchenDyon Barajas was seen today for medical management of chronic issues.  Diagnoses and all orders for this visit:  Screening for lung cancer -     Ambulatory Referral Lung Cancer Screening [facility]  COPD, moderate (HCC) -     ipratropium-albuterol (DUONEB) 0.5-2.5 (3) MG/3ML SOLN; Take 3 mLs by nebulization every 6 (six) hours as needed. -     Ambulatory Referral Lung Cancer Screening [facility]  Trouble in sleeping -     traZODone (DESYREL) 100 MG tablet; TAKE 2 TABLETS BY MOUTH AT BEDTIMe.  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  Primary hypertension -     losartan (COZAAR) 50 MG tablet; Take 1 tablet (50 mg total) by mouth daily. -     metoprolol succinate (TOPROL-XL) 100 MG 24 hr tablet; Take 1 tablet (100 mg total) by mouth daily. Take with or immediately following a meal. -     amLODipine (NORVASC) 10 MG tablet; Take 1 tablet (10 mg total) by mouth daily. TAKE 1 TABLET BY MOUTH ONCE DAILY  History of stroke -     losartan (COZAAR) 50 MG tablet; Take 1 tablet (50 mg total) by mouth daily.  Seasonal allergies -     levocetirizine  (XYZAL) 5 MG tablet; Take 1 tablet (5 mg total) by mouth daily. -     montelukast (SINGULAIR) 10 MG tablet; Take 1 tablet (10 mg total) by mouth at bedtime.  Generalized anxiety disorder -     escitalopram (LEXAPRO) 5 MG tablet; Take 1 tablet (5 mg total) by mouth daily.  BPH without urinary obstruction -     doxazosin (CARDURA) 4 MG tablet; Take 1 tablet (4 mg total) by mouth 2 (two) times daily.  Dyslipidemia -     rosuvastatin (CRESTOR) 20 MG tablet; Take 1 tablet (20 mg total) by mouth daily.  Wheezing -     budesonide-formoterol (SYMBICORT) 160-4.5 MCG/ACT inhaler; Inhale 2 puffs into the lungs 2 (two) times daily.  Plantar fasciitis -  diclofenac Sodium (VOLTAREN) 1 % GEL; Apply 4 g topically 4 (four) times daily. To affected joint.  Tinea cruris -     clotrimazole-betamethasone (LOTRISONE) cream; Apply 1 Application topically daily. For jock itch.  Former smoker -     Ambulatory Referral Lung Cancer Screening [facility]  Labs UTD, last A1C 6.0 Refilled medications today BP not to goal: recheck 140/75 and improved. Goal BP 130/80 encouraged pt to check at home and keep log, nurse visit in 1 month Trial of mucinex bid for secretions and mucus chronic Declined all vaccinations Encouraged schedule Medicare Wellness Referral to lung cancer screening   [name], PA-C

## 2023-08-17 NOTE — Patient Instructions (Signed)
 Mucinex 600mg  with dinner.

## 2023-08-23 ENCOUNTER — Encounter: Payer: Self-pay | Admitting: Physician Assistant

## 2023-08-23 DIAGNOSIS — Z8673 Personal history of transient ischemic attack (TIA), and cerebral infarction without residual deficits: Secondary | ICD-10-CM | POA: Insufficient documentation

## 2023-09-03 DIAGNOSIS — M272 Inflammatory conditions of jaws: Secondary | ICD-10-CM | POA: Diagnosis not present

## 2023-09-03 DIAGNOSIS — R220 Localized swelling, mass and lump, head: Secondary | ICD-10-CM | POA: Diagnosis not present

## 2023-09-03 DIAGNOSIS — J329 Chronic sinusitis, unspecified: Secondary | ICD-10-CM | POA: Diagnosis not present

## 2023-09-03 DIAGNOSIS — K047 Periapical abscess without sinus: Secondary | ICD-10-CM | POA: Diagnosis not present

## 2023-09-03 DIAGNOSIS — R6884 Jaw pain: Secondary | ICD-10-CM | POA: Diagnosis not present

## 2023-09-03 DIAGNOSIS — L0201 Cutaneous abscess of face: Secondary | ICD-10-CM | POA: Diagnosis not present

## 2023-09-03 LAB — LAB REPORT - SCANNED: EGFR: 88

## 2023-09-06 ENCOUNTER — Other Ambulatory Visit: Payer: Self-pay | Admitting: Physician Assistant

## 2023-09-06 DIAGNOSIS — I10 Essential (primary) hypertension: Secondary | ICD-10-CM

## 2023-09-06 DIAGNOSIS — Z8673 Personal history of transient ischemic attack (TIA), and cerebral infarction without residual deficits: Secondary | ICD-10-CM

## 2023-09-07 ENCOUNTER — Other Ambulatory Visit: Payer: Self-pay | Admitting: Physician Assistant

## 2023-09-07 ENCOUNTER — Telehealth: Payer: Self-pay

## 2023-09-07 DIAGNOSIS — K047 Periapical abscess without sinus: Secondary | ICD-10-CM | POA: Diagnosis not present

## 2023-09-07 DIAGNOSIS — J449 Chronic obstructive pulmonary disease, unspecified: Secondary | ICD-10-CM

## 2023-09-07 DIAGNOSIS — R252 Cramp and spasm: Secondary | ICD-10-CM | POA: Diagnosis not present

## 2023-09-07 DIAGNOSIS — L0291 Cutaneous abscess, unspecified: Secondary | ICD-10-CM | POA: Diagnosis not present

## 2023-09-07 NOTE — Transitions of Care (Post Inpatient/ED Visit) (Unsigned)
   09/07/2023  Name: Travis Barajas MRN: [id number] DOB: 03/19/1951  Today's TOC FU Call Status: Today's TOC FU Call Status:: Unsuccessful Call (1st Attempt) Unsuccessful Call (1st Attempt) Date: 09/07/23  Attempted to reach the patient regarding the most recent Inpatient/ED visit.  Follow Up Plan: Additional outreach attempts will be made to reach the patient to complete the Transitions of Care (Post Inpatient/ED visit) call.   Signature  Karena Addison, LPN [facility] Nurse Health Advisor Direct Dial [phone]

## 2023-09-08 DIAGNOSIS — R252 Cramp and spasm: Secondary | ICD-10-CM | POA: Diagnosis not present

## 2023-09-08 DIAGNOSIS — K047 Periapical abscess without sinus: Secondary | ICD-10-CM | POA: Diagnosis not present

## 2023-09-08 NOTE — Transitions of Care (Post Inpatient/ED Visit) (Unsigned)
   09/08/2023  Name: Travis Barajas MRN: [id number] DOB: 12-May-1951  Today's TOC FU Call Status: Today's TOC FU Call Status:: Unsuccessful Call (2nd Attempt) Unsuccessful Call (1st Attempt) Date: 09/07/23 Unsuccessful Call (2nd Attempt) Date: 09/08/23  Attempted to reach the patient regarding the most recent Inpatient/ED visit.  Follow Up Plan: Additional outreach attempts will be made to reach the patient to complete the Transitions of Care (Post Inpatient/ED visit) call.   Signature Karena Addison, LPN [facility] Nurse Health Advisor Direct Dial [phone]

## 2023-09-09 NOTE — Transitions of Care (Post Inpatient/ED Visit) (Signed)
   09/09/2023  Name: Travis Barajas MRN: [id number] DOB: 07-01-1950  Today's TOC FU Call Status: Today's TOC FU Call Status:: Unsuccessful Call (3rd Attempt) Unsuccessful Call (1st Attempt) Date: 09/07/23 Unsuccessful Call (2nd Attempt) Date: 09/08/23  Attempted to reach the patient regarding the most recent Inpatient/ED visit.  Follow Up Plan: No further outreach attempts will be made at this time. We have been unable to contact the patient.  Signature Karena Addison, LPN [facility] Nurse Health Advisor Direct Dial [phone]

## 2023-09-13 ENCOUNTER — Other Ambulatory Visit (HOSPITAL_COMMUNITY): Payer: Self-pay

## 2023-09-29 DIAGNOSIS — N5201 Erectile dysfunction due to arterial insufficiency: Secondary | ICD-10-CM | POA: Diagnosis not present

## 2023-10-05 ENCOUNTER — Other Ambulatory Visit: Payer: Self-pay | Admitting: Physician Assistant

## 2023-10-05 DIAGNOSIS — J449 Chronic obstructive pulmonary disease, unspecified: Secondary | ICD-10-CM

## 2023-10-18 ENCOUNTER — Other Ambulatory Visit: Payer: Self-pay | Admitting: Physician Assistant

## 2023-10-18 DIAGNOSIS — F419 Anxiety disorder, unspecified: Secondary | ICD-10-CM

## 2023-10-20 NOTE — Telephone Encounter (Signed)
 Last OV: 08/17/23 Next OV: 02/22/24 Last RF: 07/30/23

## 2023-10-25 ENCOUNTER — Other Ambulatory Visit: Payer: Self-pay | Admitting: Physician Assistant

## 2023-10-25 DIAGNOSIS — R600 Localized edema: Secondary | ICD-10-CM

## 2023-10-27 ENCOUNTER — Encounter: Payer: Self-pay | Admitting: Physician Assistant

## 2023-10-29 ENCOUNTER — Telehealth: Payer: Self-pay

## 2023-10-29 DIAGNOSIS — Z87891 Personal history of nicotine dependence: Secondary | ICD-10-CM

## 2023-10-29 DIAGNOSIS — Z122 Encounter for screening for malignant neoplasm of respiratory organs: Secondary | ICD-10-CM

## 2023-10-29 NOTE — Telephone Encounter (Signed)
.  Lung Cancer Screening Narrative/Criteria Questionnaire (Cigarette Smokers Only- No Cigars/Pipes/vapes)   Travis Barajas   SDMV:11/24/2023 at 3:00 pm with Natalie       07-02-1950   LDCT: 12/01/2023 at 4:30 pm at [facility]    73 y.o.   Phone: [phone]  Lung Screening Narrative (confirm age 46-77 yrs Medicare / 50-80 yrs Private pay insurance)   Insurance information: Paramedic   Referring Provider: Lindaann Requena   This screening involves an initial phone call with a team member from our program. It is called a shared decision making visit. The initial meeting is required by  insurance and Medicare to make sure you understand the program. This appointment takes about 15-20 minutes to complete. You will complete the screening scan at your scheduled date/time.  This scan takes about 5-10 minutes to complete. You can eat and drink normally before and after the scan.  Criteria questions for Lung Cancer Screening:   Are you a current or former smoker? Former Age began smoking: 18   If you are a former smoker, what year did you quit smoking?Quit 2020 (within 15 yrs)   To calculate your smoking history, I need an accurate estimate of how many packs of cigarettes you smoked per day and for how many years. (Not just the number of PPD you are now smoking)   Years smoking 50 x Packs per day 1 = Pack years 50   (at least 20 pack yrs)   (Make sure they understand that we need to know how much they have smoked in the past, not just the number of PPD they are smoking now)  Do you have a personal history of cancer?  No    Do you have a family history of cancer? Yes  (cancer type and and relative) Brother Bone Cancer  Are you coughing up blood?  No  Have you had unexplained weight loss of 15 lbs or more in the last 6 months? No  It looks like you meet all criteria.  When would be a good time for us  to schedule you for this screening?   Additional information: N/A

## 2023-11-11 ENCOUNTER — Other Ambulatory Visit: Payer: Self-pay | Admitting: Physician Assistant

## 2023-11-11 DIAGNOSIS — M7021 Olecranon bursitis, right elbow: Secondary | ICD-10-CM

## 2023-11-16 ENCOUNTER — Encounter: Payer: Self-pay | Admitting: Physician Assistant

## 2023-11-16 ENCOUNTER — Ambulatory Visit: Admitting: Physician Assistant

## 2023-11-16 VITALS — BP 158/76 | HR 104 | Ht 64.0 in | Wt 170.0 lb

## 2023-11-16 DIAGNOSIS — F03A0 Unspecified dementia, mild, without behavioral disturbance, psychotic disturbance, mood disturbance, and anxiety: Secondary | ICD-10-CM | POA: Diagnosis not present

## 2023-11-16 MED ORDER — DONEPEZIL HCL 5 MG PO TBDP: 5.0000 mg | ORAL_TABLET | Freq: Every day | ORAL | 0 refills | Status: DC

## 2023-11-16 NOTE — Patient Instructions (Addendum)
 Start aricept at bedtime.  Referral for neurology to be made.   Problems With Thinking and Memory (Mild Neurocognitive Disorder): What to Know Mild neurocognitive disorder, formerly known as mild cognitive impairment, is a disorder where your memory doesn't work as well as it should. It may also affect other mental abilities like thinking, communicating, behavior, and being able to finish tasks. These problems can be noticed and measured. But they usually don't stop you from doing daily activities or living on your own. Mild neurocognitive disorder usually happens after 72 years of age. But it can also happen at younger ages. It's not as serious as major neurocognitive disorder, also known as dementia, but it may be the first sign of it. In general, the symptoms of this condition get worse over time. In rare cases, symptoms can get better. What are the causes? This condition may be caused by: Brain disorders like Alzheimer's disease, Parkinson's disease, and other conditions that slowly damage nerve cells. Diseases that affect the blood vessels in the brain and cause small strokes. Certain infections, like HIV. Traumatic brain injury. Other medical conditions, such as brain tumors, underactive thyroid  (hypothyroidism), and not having enough vitamin B12. Using certain drugs or medicines. What increases the risk? Being older than 72 years of age. Being male. Having a lower level of education. Diabetes, high blood pressure, high cholesterol, and other conditions that raise the risk for blood vessel diseases. Untreated or undertreated sleep apnea. Having a certain type of gene that can be inherited, or passed down from parent to child. Long-term health problems like heart disease, lung disease, liver disease, kidney disease, or depression. What are the signs or symptoms? Trouble remembering things. You may: Forget names, phone numbers, or details of recent events. Forget about social events and  appointments. Often forget where you put your car keys or other items. Trouble thinking and solving problems. You may have trouble with complex tasks like: Paying bills. Driving in places you don't know well. Trouble communicating. You may have trouble: Finding the right word or naming an object. Forming a sentence that makes sense. Understanding what you read or hear. Changes in your behavior or personality. When this happens, you may: Lose interest in the things you used to enjoy. Avoid being around people. Get angry more easily than usual. Act before thinking. How is this diagnosed? This condition is diagnosed based on: Your symptoms. Your health care provider may ask you and the people you spend time with, like family and friends, about your symptoms. Memory tests and other tests to check how your brain is working. Your provider may refer you to a provider called a neurologist or a mental health specialist. To try to find out the cause of your condition, your provider may: Get a detailed medical history. Ask about use of alcohol, drugs, and medicines. Do a physical exam. Order blood tests and brain imaging tests. How is this treated? Mild neurocognitive disorder that's caused by medicine use, drug use, infection, or another medical condition may get better when the cause is treated, or when medicines or drugs are stopped. If this disorder has another cause, it usually doesn't improve and may get worse. In these cases, the goal of treatment is to help you manage the symptoms. This may include: Medicines to help with memory and behavior symptoms. Talk therapy. This provides education, emotional support, memory aids, and other ways of making up for problems with mental tasks. Lifestyle changes. These may include: Getting regular exercise. Eating a  healthy diet that includes omega-3 fatty acids. Doing things to challenge your thinking and memory skills. Spending more time being with  and talking to other people. Using routines like having regular times for meals and going to bed. Follow these instructions at home: Eating and drinking  Drink more fluids as told. Eat a healthy diet that includes omega-3 fatty acids. These can be found in: Fish. Nuts. Leafy vegetables. Vegetable oils. If you drink alcohol: Limit how much you have to: 0-1 drink a day if you're male. 0-2 drinks a day if you're male. Know how much alcohol is in your drink. In the [place] one drink is one 12 oz bottle of beer (355 mL), one 5 oz glass of wine (148 mL), or one 1 oz glass of hard liquor (44 mL). Lifestyle  Get regular exercise as told by your provider. Do not smoke, vape, or use nicotine or tobacco. Use healthy ways to manage stress. If you need help managing stress, ask your provider. Keep spending time with other people. Keep your mind active by doing activities you enjoy, like reading or playing games. Make sure you get good sleep at night. These tips can help: Try not to take naps during the day. Keep your bedroom dark and cool. Do not exercise in the few hours before you go to bed. Do not have foods or drinks with caffeine at night. General instructions Take medicines only as told. Your provider may tell you to avoid taking medicines that can affect thinking. These include some medicines for pain or sleeping. Work with your provider to find out: What things you need help with. What your safety needs are. Where to find more information [employer] on Aging: [email or web address] Contact a health care provider if: You have any new symptoms. Get help right away if: You have new confusion or your confusion gets worse. You act in ways that put you or your family in danger. This information is not intended to replace advice given to you by your health care provider. Make sure you discuss any questions you have with your health care provider. Document Revised: 11/24/2022 Document  Reviewed: 11/24/2022 Elsevier Patient Education  2024 [employer].

## 2023-11-17 ENCOUNTER — Ambulatory Visit: Payer: Self-pay | Admitting: Physician Assistant

## 2023-11-17 LAB — CBC
Hematocrit: 42.6 % (ref 37.5–51.0)
Hemoglobin: 13.1 g/dL (ref 13.0–17.7)
MCH: 25.9 pg — ABNORMAL LOW (ref 26.6–33.0)
MCHC: 30.8 g/dL — ABNORMAL LOW (ref 31.5–35.7)
MCV: 84 fL (ref 79–97)
Platelets: 151 10*3/uL (ref 150–450)
RBC: 5.05 x10E6/uL (ref 4.14–5.80)
RDW: 15.6 % — ABNORMAL HIGH (ref 11.6–15.4)
WBC: 8.4 10*3/uL (ref 3.4–10.8)

## 2023-11-17 LAB — VITAMIN B12: Vitamin B-12: 643 pg/mL (ref 232–1245)

## 2023-11-17 LAB — FOLATE: Folate: 11.6 ng/mL (ref >3.0)

## 2023-11-17 LAB — SEDIMENTATION RATE: Sed Rate: 10 mm/h (ref 0–30)

## 2023-11-17 LAB — HIV ANTIBODY (ROUTINE TESTING W REFLEX): HIV Screen 4th Generation wRfx: NONREACTIVE

## 2023-11-17 LAB — RPR: RPR Ser Ql: NONREACTIVE

## 2023-11-17 LAB — TSH: TSH: 0.781 u[IU]/mL (ref 0.450–4.500)

## 2023-11-17 NOTE — Progress Notes (Signed)
 Please add ferritin and serum iron.   Labs look good.

## 2023-11-19 LAB — FERRITIN: Ferritin: 136 ng/mL (ref 30–400)

## 2023-11-19 LAB — SPECIMEN STATUS REPORT

## 2023-11-19 LAB — IRON: Iron: 66 ug/dL (ref 38–169)

## 2023-11-19 NOTE — Progress Notes (Signed)
Iron and iron stores look good.

## 2023-11-24 ENCOUNTER — Ambulatory Visit: Admitting: *Deleted

## 2023-11-24 ENCOUNTER — Encounter: Payer: Self-pay | Admitting: *Deleted

## 2023-11-24 ENCOUNTER — Ambulatory Visit

## 2023-11-24 DIAGNOSIS — Z87891 Personal history of nicotine dependence: Secondary | ICD-10-CM | POA: Diagnosis not present

## 2023-11-24 NOTE — Progress Notes (Signed)
 Spoke with wife Virtual Visit via Telephone Note  I connected with Travis Barajas on 11/24/23 at  3:00 PM EDT by telephone and verified that I am speaking with the correct person using two identifiers.  Location: Patient: in home Provider: [address] [place], [place], [address] Shared Decision Making Visit Lung Cancer Screening Program [phone])   Eligibility: Age 73 y.o. Pack Years Smoking History Calculation 50 (# packs/per year x # years smoked) Recent History of coughing up blood  no Unexplained weight loss? no ( >Than 15 pounds within the last 6 months ) Prior History Lung / other cancer no (Diagnosis within the last 5 years already requiring surveillance chest CT Scans). Smoking Status Former Smoker Former Smokers: Years since quit: 5 years  Quit Date: 2020  Visit Components: Discussion included one or more decision making aids. yes Discussion included risk/benefits of screening. yes Discussion included potential follow up diagnostic testing for abnormal scans. yes Discussion included meaning and risk of over diagnosis. yes Discussion included meaning and risk of False Positives. yes Discussion included meaning of total radiation exposure. yes  Counseling Included: Importance of adherence to annual lung cancer LDCT screening. yes Impact of comorbidities on ability to participate in the program. yes Ability and willingness to under diagnostic treatment. yes  Smoking Cessation Counseling: Current Smokers:  Discussed importance of smoking cessation. yes Information about tobacco cessation classes and interventions provided to patient. yes Patient provided with ticket for LDCT Scan. yes Symptomatic Patient. no  Counseling NA Diagnosis Code: Tobacco Use Z72.0 Asymptomatic Patient yes  Counseling (Intermediate counseling: > three minutes counseling) W0981 Former Smokers:  Discussed the importance of maintaining cigarette abstinence. yes Diagnosis Code:  Personal History of Nicotine Dependence. X91.478 Information about tobacco cessation classes and interventions provided to patient. Yes Patient provided with ticket for LDCT Scan. yes Written Order for Lung Cancer Screening with LDCT placed in Epic. Yes (CT Chest Lung Cancer Screening Low Dose W/O CM) [id number] Z12.2-Screening of respiratory organs Z87.891-Personal history of nicotine dependence   Valentin Gaskins, RN 11/24/23

## 2023-11-24 NOTE — Patient Instructions (Signed)

## 2023-11-29 ENCOUNTER — Encounter: Payer: Self-pay | Admitting: Physician Assistant

## 2023-11-29 ENCOUNTER — Ambulatory Visit

## 2023-11-29 DIAGNOSIS — F17210 Nicotine dependence, cigarettes, uncomplicated: Secondary | ICD-10-CM | POA: Diagnosis not present

## 2023-11-29 DIAGNOSIS — Z122 Encounter for screening for malignant neoplasm of respiratory organs: Secondary | ICD-10-CM

## 2023-11-29 DIAGNOSIS — Z87891 Personal history of nicotine dependence: Secondary | ICD-10-CM | POA: Diagnosis not present

## 2023-11-29 NOTE — Progress Notes (Signed)
 Established Patient Office Visit  Subjective   Patient ID: Travis Barajas, male    DOB: Jul 29, 1950  Age: 73 y.o. MRN: [id number]  Chief Complaint  Patient presents with   Medical Management of Chronic Issues    Medication,Mental changes.    HPI Pt is a 73 yo male who presents to the clinic with his wife to discuss memory. His wife reports his memory has not been then same since stroke in 2020 but recently worsening over the past 6 months. His short term memory is affected. He still has great long term memory. Pt does not exercise and spends his time inside at home. He does not have many friends. He does not have social obligations. He does not get angry or have any behavioral concerns. He continues to sleep at night. No confusion but reports not remembering things he does, says, people he sees or talks to.   Patient Active Problem List   Diagnosis Date Noted   Mild dementia without behavioral disturbance, psychotic disturbance, mood disturbance, or anxiety (HCC) 11/16/2023   History of stroke 08/23/2023   Pre-diabetes 04/20/2023   Toe hematoma, right, initial encounter 01/04/2023   Tinea cruris 01/04/2023   Aortic atherosclerosis (HCC) 02/02/2022   Coronary artery disease involving native heart without angina pectoris 02/02/2022   Moderate asthma with exacerbation 11/25/2021   Elevated fasting glucose 08/27/2021   Diminished pulses in lower extremity 08/26/2021   Benign prostatic hyperplasia with urinary frequency 04/08/2021   Seasonal allergies 11/29/2020   Bilateral hand swelling 01/05/2020   Plantar fasciitis 01/05/2020   Change in voice 09/01/2019   Expiratory stridor 09/01/2019   Choking 09/01/2019   Abnormal CT scan, neck 09/01/2019   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   Pulmonary nodules 07/31/2019   Wheezing 07/24/2019   COPD with acute exacerbation (HCC) 07/24/2019   Left thigh pain 07/24/2019   Acute left-sided low back pain with left-sided sciatica 07/24/2019    Lower extremity edema 06/15/2019   Anxiety 06/15/2019   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HCC) 04/26/2019   Renal cyst, left 04/26/2019   Gallstone 04/26/2019   Distended abdomen 04/14/2019   Primary hypertension 04/14/2019   Cerebrovascular accident (HCC) 04/03/2019   Intercostal retractions 03/31/2019   Thrombocytopenia (HCC) 02/24/2019   Slurred speech 02/24/2019   Dizziness 02/24/2019   Dysphagia 02/24/2019   TIA (transient ischemic attack) 02/24/2019   PVD (peripheral vascular disease) with claudication (HCC) 02/22/2019   Trouble in sleeping 10/05/2018   Current smoker 09/01/2017   Hemoptysis 09/01/2017   Left carotid bruit 09/01/2017   Systolic murmur 09/01/2017   Acute nonseasonal allergic rhinitis due to pollen 05/04/2017   Dyslipidemia 05/04/2017   BPH without urinary obstruction 05/25/2016   Generalized anxiety disorder 01/15/2015   Claudication (HCC) 09/26/2014   COPD, moderate (HCC) 03/21/2014   Erectile dysfunction 03/21/2014   Hyperlipidemia 11/17/2013   Hematuria 11/07/2013   Tobacco user 12/05/2011   HTN (hypertension), malignant 10/01/2011   GERD (gastroesophageal reflux disease) 07/23/2011   Allergic rhinitis 07/23/2011   Joint pain 07/23/2011   Past Medical History:  Diagnosis Date   Allergy    Anxiety    Arthritis    COPD (chronic obstructive pulmonary disease) (HCC)    Hyperlipidemia    Hypertension    Stroke [facility])    Past Surgical History:  Procedure Laterality Date   ABDOMINAL AORTOGRAM W/LOWER EXTREMITY N/A 10/16/2021   Procedure: ABDOMINAL AORTOGRAM W/LOWER EXTREMITY;  Surgeon: Young Hensen, MD;  Location: [facility];  Service:  Cardiovascular;  Laterality: N/A;   FOOT SURGERY     PERIPHERAL VASCULAR INTERVENTION Left 10/16/2021   Procedure: PERIPHERAL VASCULAR INTERVENTION;  Surgeon: Young Hensen, MD;  Location: [facility];  Service: Cardiovascular;  Laterality: Left;   Family History  Problem Relation Age of Onset    Diabetes Mother    Hypertension Mother    Alzheimer's disease Mother    CAD Mother    Heart disease Mother    Hyperlipidemia Mother    Diabetes Father    Hypertension Father    Cancer Brother    Peripheral vascular disease Brother    Cancer Daughter    Allergies  Allergen Reactions   Cialis [Tadalafil]     Hallucinations    Lipitor [Atorvastatin ]     itching   Niacin And Related Itching   Pravachol  [Pravastatin ] Itching    Itching.       ROS See HPI.    Objective:     BP (!) 158/76   Pulse (!) 104   Ht 5' 4 (1.626 m)   Wt 170 lb (77.1 kg)   SpO2 99%   BMI 29.18 kg/m  BP Readings from Last 3 Encounters:  11/16/23 (!) 158/76  08/17/23 (!) 140/75  04/12/23 (!) 171/60   Wt Readings from Last 3 Encounters:  11/16/23 170 lb (77.1 kg)  04/12/23 179 lb 8 oz (81.4 kg)  02/19/23 178 lb (80.7 kg)    ..    11/16/2023    3:59 PM  Montreal Cognitive Assessment   Visuospatial/ Executive (0/5) 5  Naming (0/3) 3  Attention: Read list of digits (0/2) 2  Attention: Read list of letters (0/1) 0  Attention: Serial 7 subtraction starting at 100 (0/3) 2  Language: Repeat phrase (0/2) 2  Language : Fluency (0/1) 1  Abstraction (0/2) 2  Delayed Recall (0/5) 1  Orientation (0/6) 3  Total 21  Adjusted Score (based on education) 21     Physical Exam Constitutional:      Appearance: Normal appearance.   Cardiovascular:     Rate and Rhythm: Normal rate and regular rhythm.  Pulmonary:     Effort: Pulmonary effort is normal.     Breath sounds: Normal breath sounds.   Neurological:     General: No focal deficit present.     Mental Status: He is alert and oriented to person, place, and time.   Psychiatric:        Mood and Affect: Mood normal.         Assessment & Plan:  Travis Barajas was seen today for medical management of chronic issues.  Diagnoses and all orders for this visit:  Mild dementia without behavioral disturbance, psychotic disturbance, mood  disturbance, or anxiety, unspecified dementia type (HCC) -     RPR -     Vitamin B12 -     Sedimentation rate -     CBC -     TSH -     Folate -     HIV antibody (with reflex) -     donepezil  (ARICEPT  ODT) 5 MG disintegrating tablet; Take 1 tablet (5 mg total) by mouth at bedtime. -     Iron -     Ferritin -     Specimen status report   MOCO was 21.  Discussed options with patient and wife Opted to start aricept  Discussed most common side effects Will refer to neurology Dementia lab panel done in office today Discuss ways to improve  memory HO given   Return in about 3 months (around 02/16/2024).    Destinie Thornsberry, PA-C

## 2023-12-08 ENCOUNTER — Other Ambulatory Visit: Payer: Self-pay | Admitting: Physician Assistant

## 2023-12-08 DIAGNOSIS — I10 Essential (primary) hypertension: Secondary | ICD-10-CM

## 2023-12-08 DIAGNOSIS — Z8673 Personal history of transient ischemic attack (TIA), and cerebral infarction without residual deficits: Secondary | ICD-10-CM

## 2023-12-10 ENCOUNTER — Other Ambulatory Visit: Payer: Self-pay | Admitting: Acute Care

## 2023-12-10 ENCOUNTER — Encounter: Payer: Self-pay | Admitting: Physician Assistant

## 2023-12-10 DIAGNOSIS — Z87891 Personal history of nicotine dependence: Secondary | ICD-10-CM

## 2023-12-10 DIAGNOSIS — Z122 Encounter for screening for malignant neoplasm of respiratory organs: Secondary | ICD-10-CM

## 2023-12-13 ENCOUNTER — Other Ambulatory Visit: Payer: Self-pay | Admitting: Physician Assistant

## 2023-12-13 DIAGNOSIS — M7021 Olecranon bursitis, right elbow: Secondary | ICD-10-CM

## 2024-01-25 ENCOUNTER — Other Ambulatory Visit: Payer: Self-pay | Admitting: Physician Assistant

## 2024-01-25 DIAGNOSIS — M7021 Olecranon bursitis, right elbow: Secondary | ICD-10-CM

## 2024-02-02 ENCOUNTER — Other Ambulatory Visit: Payer: Self-pay | Admitting: Physician Assistant

## 2024-02-02 DIAGNOSIS — B356 Tinea cruris: Secondary | ICD-10-CM

## 2024-02-07 ENCOUNTER — Ambulatory Visit: Admitting: Physician Assistant

## 2024-02-16 DIAGNOSIS — R300 Dysuria: Secondary | ICD-10-CM | POA: Diagnosis not present

## 2024-02-18 ENCOUNTER — Ambulatory Visit (INDEPENDENT_AMBULATORY_CARE_PROVIDER_SITE_OTHER): Admitting: Physician Assistant

## 2024-02-18 ENCOUNTER — Telehealth: Payer: Self-pay

## 2024-02-18 ENCOUNTER — Other Ambulatory Visit: Payer: Self-pay | Admitting: Physician Assistant

## 2024-02-18 ENCOUNTER — Other Ambulatory Visit: Payer: Self-pay

## 2024-02-18 VITALS — BP 148/72 | HR 80 | Wt 176.0 lb

## 2024-02-18 DIAGNOSIS — Z8673 Personal history of transient ischemic attack (TIA), and cerebral infarction without residual deficits: Secondary | ICD-10-CM

## 2024-02-18 DIAGNOSIS — M7021 Olecranon bursitis, right elbow: Secondary | ICD-10-CM

## 2024-02-18 DIAGNOSIS — J45901 Unspecified asthma with (acute) exacerbation: Secondary | ICD-10-CM

## 2024-02-18 DIAGNOSIS — F419 Anxiety disorder, unspecified: Secondary | ICD-10-CM

## 2024-02-18 DIAGNOSIS — R062 Wheezing: Secondary | ICD-10-CM

## 2024-02-18 DIAGNOSIS — M545 Low back pain, unspecified: Secondary | ICD-10-CM

## 2024-02-18 DIAGNOSIS — F03A0 Unspecified dementia, mild, without behavioral disturbance, psychotic disturbance, mood disturbance, and anxiety: Secondary | ICD-10-CM

## 2024-02-18 DIAGNOSIS — I10 Essential (primary) hypertension: Secondary | ICD-10-CM | POA: Diagnosis not present

## 2024-02-18 DIAGNOSIS — J449 Chronic obstructive pulmonary disease, unspecified: Secondary | ICD-10-CM

## 2024-02-18 DIAGNOSIS — R300 Dysuria: Secondary | ICD-10-CM

## 2024-02-18 LAB — POCT URINALYSIS DIP (CLINITEK)
Bilirubin, UA: NEGATIVE
Blood, UA: NEGATIVE
Glucose, UA: NEGATIVE mg/dL
Ketones, POC UA: NEGATIVE mg/dL
Leukocytes, UA: NEGATIVE
Nitrite, UA: NEGATIVE
POC PROTEIN,UA: 30 — AB
Spec Grav, UA: 1.02 (ref 1.010–1.025)
Urobilinogen, UA: 0.2 U/dL
pH, UA: 7 (ref 5.0–8.0)

## 2024-02-18 MED ORDER — METOPROLOL SUCCINATE ER 100 MG PO TB24: 100.0000 mg | ORAL_TABLET | Freq: Every day | ORAL | 1 refills | Status: DC

## 2024-02-18 MED ORDER — IPRATROPIUM-ALBUTEROL 0.5-2.5 (3) MG/3ML IN SOLN: 3.0000 mL | Freq: Four times a day (QID) | RESPIRATORY_TRACT | 0 refills | Status: AC | PRN

## 2024-02-18 MED ORDER — AZITHROMYCIN 250 MG PO TABS: ORAL_TABLET | ORAL | 0 refills | Status: DC

## 2024-02-18 MED ORDER — LOSARTAN POTASSIUM 100 MG PO TABS: 100.0000 mg | ORAL_TABLET | Freq: Every day | ORAL | 0 refills | Status: DC

## 2024-02-18 MED ORDER — AMLODIPINE BESYLATE 10 MG PO TABS: 10.0000 mg | ORAL_TABLET | Freq: Every day | ORAL | 1 refills | Status: AC

## 2024-02-18 MED ORDER — DONEPEZIL HCL 5 MG PO TBDP
5.0000 mg | ORAL_TABLET | Freq: Every day | ORAL | 3 refills | Status: DC
Start: 2024-02-18 — End: 2024-02-18

## 2024-02-18 MED ORDER — MELOXICAM 15 MG PO TABS: 15.0000 mg | ORAL_TABLET | Freq: Every day | ORAL | 0 refills | Status: AC

## 2024-02-18 MED ORDER — AMLODIPINE BESYLATE 10 MG PO TABS: 10.0000 mg | ORAL_TABLET | Freq: Every day | ORAL | 1 refills | Status: DC

## 2024-02-18 MED ORDER — MELOXICAM 15 MG PO TABS
15.0000 mg | ORAL_TABLET | Freq: Every day | ORAL | 0 refills | Status: DC
Start: 2024-02-18 — End: 2024-02-18

## 2024-02-18 MED ORDER — MIRTAZAPINE 7.5 MG PO TABS: ORAL_TABLET | ORAL | 1 refills | Status: AC

## 2024-02-18 MED ORDER — METHYLPREDNISOLONE SODIUM SUCC 125 MG IJ SOLR
125.0000 mg | Freq: Once | INTRAMUSCULAR | Status: AC
  Administered 2024-02-18: 125 mg via INTRAMUSCULAR

## 2024-02-18 MED ORDER — DONEPEZIL HCL 5 MG PO TBDP: 5.0000 mg | ORAL_TABLET | Freq: Every day | ORAL | 3 refills | Status: AC

## 2024-02-18 MED ORDER — BUDESONIDE-FORMOTEROL FUMARATE 160-4.5 MCG/ACT IN AERO: 2.0000 | INHALATION_SPRAY | Freq: Two times a day (BID) | RESPIRATORY_TRACT | 4 refills | Status: DC

## 2024-02-18 MED ORDER — MIRTAZAPINE 7.5 MG PO TABS: ORAL_TABLET | ORAL | 1 refills | Status: DC

## 2024-02-18 MED ORDER — IPRATROPIUM-ALBUTEROL 0.5-2.5 (3) MG/3ML IN SOLN: 3.0000 mL | Freq: Four times a day (QID) | RESPIRATORY_TRACT | 0 refills | Status: DC | PRN

## 2024-02-18 MED ORDER — METOPROLOL SUCCINATE ER 100 MG PO TB24: 100.0000 mg | ORAL_TABLET | Freq: Every day | ORAL | 1 refills | Status: AC

## 2024-02-18 MED ORDER — ALPRAZOLAM 0.5 MG PO TABS: ORAL_TABLET | ORAL | 0 refills | Status: DC

## 2024-02-18 MED ORDER — AZITHROMYCIN 250 MG PO TABS: ORAL_TABLET | ORAL | 0 refills | Status: AC

## 2024-02-18 MED ORDER — PREDNISONE 20 MG PO TABS: 20.0000 mg | ORAL_TABLET | Freq: Two times a day (BID) | ORAL | 0 refills | Status: AC

## 2024-02-18 MED ORDER — ALPRAZOLAM 0.5 MG PO TABS
ORAL_TABLET | ORAL | 0 refills | Status: DC
Start: 2024-02-18 — End: 2024-02-18

## 2024-02-18 MED ORDER — BUDESONIDE-FORMOTEROL FUMARATE 160-4.5 MCG/ACT IN AERO: 2.0000 | INHALATION_SPRAY | Freq: Two times a day (BID) | RESPIRATORY_TRACT | 4 refills | Status: AC

## 2024-02-18 MED ORDER — PREDNISONE 20 MG PO TABS: 20.0000 mg | ORAL_TABLET | Freq: Two times a day (BID) | ORAL | 0 refills | Status: DC

## 2024-02-18 NOTE — Telephone Encounter (Signed)
 Spoke with [facility] pharmacy  States that they did not receive the prescriptions through e- script Pended prescriptions to be resentl

## 2024-02-18 NOTE — Progress Notes (Signed)
sol

## 2024-02-18 NOTE — Telephone Encounter (Signed)
 Spoke with [facility] - scripts were not received.  Pended for refill and resent to [facility] to resend

## 2024-02-18 NOTE — Progress Notes (Signed)
 Established Patient Office Visit  Subjective   Patient ID: Travis Barajas, male    DOB: 1951-01-03  Age: 73 y.o. MRN: [id number]  Chief Complaint  Patient presents with   Dysuria    HPI Pt is a 73 yo male who presents to the clinic for cough and wheezing and concern for UTI. His wife accompanies him. He has been having some low back pain intermittently over last few days and wonders if it could be a UTI. He denies any dysuria. He always feels like he has to go more frequently. He denies any fever, chills, body aches.   His cough worsening of the last week and has asthma. He has been using his albuterol  inhaler but does not have any more nebulizer solution. He does take symbicort  daily.   He needs medications refilled.   He needs aricept  for memory. Mobic  for pain. Xanax  for anxiety.   He is not sleeping well with trazodone  and would like to try something else for sleep.    ROS See HPI.    Objective:     BP (!) 148/72 (BP Location: Left Arm, Patient Position: Sitting, Cuff Size: Normal)   Pulse 80   Wt 176 lb (79.8 kg)   SpO2 98%   BMI 30.21 kg/m  BP Readings from Last 3 Encounters:  02/18/24 (!) 148/72  11/16/23 (!) 158/76  08/17/23 (!) 140/75   Wt Readings from Last 3 Encounters:  02/18/24 176 lb (79.8 kg)  11/16/23 170 lb (77.1 kg)  04/12/23 179 lb 8 oz (81.4 kg)      Physical Exam Constitutional:      Appearance: Normal appearance.  HENT:     Head: Normocephalic.     Right Ear: Tympanic membrane, ear canal and external ear normal. There is no impacted cerumen.     Left Ear: Tympanic membrane, ear canal and external ear normal.     Nose: Nose normal.     Mouth/Throat:     Mouth: Mucous membranes are moist.  Eyes:     Pupils: Pupils are equal, round, and reactive to light.  Cardiovascular:     Rate and Rhythm: Normal rate and regular rhythm.     Pulses: Normal pulses.  Pulmonary:     Effort: Pulmonary effort is normal.     Breath sounds:  Wheezing present.     Comments: Expiratory wheezing in bilateral lung fields Musculoskeletal:     Cervical back: Normal range of motion and neck supple.     Right lower leg: No edema.     Left lower leg: No edema.     Comments: Left sided low back pain and tightness with palpation.   Neurological:     General: No focal deficit present.     Mental Status: He is alert and oriented to person, place, and time.  Psychiatric:        Mood and Affect: Mood normal.      Results for orders placed or performed in visit on 02/18/24  Urine Culture   Specimen: Urine   Urine  Result Value Ref Range   Urine Culture, Routine Final report    Organism ID, Bacteria Comment   POCT URINALYSIS DIP (CLINITEK)  Result Value Ref Range   Color, UA yellow yellow   Clarity, UA clear clear   Glucose, UA negative negative mg/dL   Bilirubin, UA negative negative   Ketones, POC UA negative negative mg/dL   Spec Grav, UA 8.979 8.989 - 1.025  Blood, UA negative negative   pH, UA 7.0 5.0 - 8.0   POC PROTEIN,UA =30 (A) negative, trace   Urobilinogen, UA 0.2 0.2 or 1.0 E.U./dL   Nitrite, UA Negative Negative   Leukocytes, UA Negative Negative      Assessment & Plan:  SABRASABRAColvin Blatt was seen today for dysuria.  Diagnoses and all orders for this visit:  Acute left-sided low back pain without sciatica -     POCT URINALYSIS DIP (CLINITEK) -     Urine Culture  Moderate asthma with exacerbation, unspecified whether persistent -     methylPREDNISolone  sodium succinate (SOLU-MEDROL ) 125 mg/2 mL injection 125 mg  Primary hypertension -     Discontinue: amLODipine  (NORVASC ) 10 MG tablet; Take 1 tablet (10 mg total) by mouth daily. TAKE 1 TABLET BY MOUTH ONCE DAILY -     Discontinue: metoprolol  succinate (TOPROL -XL) 100 MG 24 hr tablet; Take 1 tablet (100 mg total) by mouth daily. Take with or immediately following a meal.  History of stroke  Wheezing -     Discontinue: budesonide -formoterol  (SYMBICORT )  160-4.5 MCG/ACT inhaler; Inhale 2 puffs into the lungs 2 (two) times daily.  Olecranon bursitis of right elbow -     Discontinue: meloxicam  (MOBIC ) 15 MG tablet; Take 1 tablet (15 mg total) by mouth daily. For pain.  Mild dementia without behavioral disturbance, psychotic disturbance, mood disturbance, or anxiety, unspecified dementia type (HCC) -     Discontinue: donepezil  (ARICEPT  ODT) 5 MG disintegrating tablet; Take 1 tablet (5 mg total) by mouth at bedtime.  COPD, moderate (HCC) -     Discontinue: ipratropium-albuterol  (DUONEB) 0.5-2.5 (3) MG/3ML SOLN; Take 3 mLs by nebulization every 6 (six) hours as needed.  Anxiety -     Discontinue: ALPRAZolam  (XANAX ) 0.5 MG tablet; Take one tablet as needed for anxiety no more than twice a day.  Other orders -     Discontinue: azithromycin  (ZITHROMAX  Z-PAK) 250 MG tablet; Take 2 tablets (500 mg) on  Day 1,  followed by 1 tablet (250 mg) once daily on Days 2 through 5. -     Discontinue: predniSONE  (DELTASONE ) 20 MG tablet; Take 1 tablet (20 mg total) by mouth 2 (two) times daily with a meal. -     Discontinue: mirtazapine  (REMERON ) 7.5 MG tablet; Take one to two tablets at bedtime for sleep. -     Discontinue: losartan  (COZAAR ) 100 MG tablet; Take 1 tablet (100 mg total) by mouth daily.   UA positive for protein only Will culture Pt is not really having any symptoms today low back pain has resolved Suspect low back pain is MSK related Discussed symptomatic care  COPd exacerbation treated with zpack, prednisone  sent to pharmacy and solumedrol 125mg  IM in office Continue to use duoneb every 4-6 hours Follow up as needed if symptoms persist or worsen Refilled symbicort  for prevention  Stop trazodone  and start remeron   Xanax  decreased to no more than once a day as needed for anxiety Aricept  refilled for memory.  BP not to goal Increased cozaar  to 100mg   Continue norvasc  and metoprolol  Recheck BP In 2-3 weeks with nurse visit  Return in  about 2 weeks (around 03/03/2024) for BP recheck.    Keiandra Sullenger, PA-C

## 2024-02-18 NOTE — Telephone Encounter (Signed)
 All of them sent to [facility] can we call pharmacy?

## 2024-02-18 NOTE — Patient Instructions (Addendum)
 Start zpak and prednisone  today for asthmatic bronchitis Delsym for cough at bedtime Duoneb every 4-6 hours as needed for cough/congestion/shortness of breath Increased losartan  to 100mg  daily Stop trazodone  and start remeron  1-2 tablets at bedtime for sleep

## 2024-02-18 NOTE — Telephone Encounter (Signed)
 Patients wife states that pharmacy did not receive medication refills on the medications  that was sent to pharmacy, thanks.

## 2024-02-20 LAB — URINE CULTURE

## 2024-02-21 ENCOUNTER — Telehealth: Payer: Self-pay

## 2024-02-21 ENCOUNTER — Encounter: Payer: Self-pay | Admitting: Physician Assistant

## 2024-02-21 ENCOUNTER — Ambulatory Visit: Payer: Self-pay | Admitting: Physician Assistant

## 2024-02-21 DIAGNOSIS — M545 Low back pain, unspecified: Secondary | ICD-10-CM | POA: Insufficient documentation

## 2024-02-21 NOTE — Telephone Encounter (Signed)
 Copied from CRM [id number]. Topic: Clinical - Prescription Issue >> Feb 18, 2024  3:30 PM Susanna ORN wrote: Reason for CRM: Patient's wife, Johnsie Ned, called stating that none of patient's prescriptions were received by the pharmacy today. She states he's needing them all, especially the Z-pak, by today. Called [facility] & spoke with Bobbette and she stated she would notify the nurse as well. Please give patient's wife a call to let her know once they have been resent. CB #: [id number].

## 2024-02-21 NOTE — Telephone Encounter (Signed)
 Spoke with patient's wife Johnsie. She states that she was able to receive the prescriptions on Saturday 09/06/225 at the pharmacy. Issue has resolved.

## 2024-02-21 NOTE — Progress Notes (Signed)
 No evidence of any infection in culture of urine.

## 2024-02-22 ENCOUNTER — Ambulatory Visit: Admitting: Physician Assistant

## 2024-03-06 ENCOUNTER — Ambulatory Visit (INDEPENDENT_AMBULATORY_CARE_PROVIDER_SITE_OTHER)

## 2024-03-06 VITALS — BP 153/61 | HR 75 | Resp 17 | Ht 64.0 in

## 2024-03-06 DIAGNOSIS — I10 Essential (primary) hypertension: Secondary | ICD-10-CM | POA: Diagnosis not present

## 2024-03-06 MED ORDER — HYDRALAZINE HCL 100 MG PO TABS: 50.0000 mg | ORAL_TABLET | Freq: Three times a day (TID) | ORAL | 0 refills | Status: AC

## 2024-03-06 NOTE — Progress Notes (Signed)
 Pt here for BP 2 wk recheck. BP 148/72  last office visit. Denies CP, SOB, or headaches.  Pt first BP was 160/66 pt sat for 10 mins and then rechecked. Second reading was 153/61. Reported off to [employer], PA-C pt advised to increase his hydralazine  to 50mg  x3/day from his 25mg  3x/day. Pt advised to RTC in 2wks for BP recheck.

## 2024-03-07 ENCOUNTER — Telehealth: Payer: Self-pay

## 2024-03-07 ENCOUNTER — Other Ambulatory Visit (HOSPITAL_COMMUNITY): Payer: Self-pay

## 2024-03-07 NOTE — Telephone Encounter (Signed)
 Pharmacy Patient Advocate Encounter   Received notification from Patient Pharmacy that prior authorization for Mirtazapine  7.5mg  tabs is required/requested.   Insurance verification completed.   The patient is insured through [employer] .   Per test claim:  Mirtazapine  15mg  tabs is preferred by the insurance.  If suggested medication is appropriate, Please send in a new RX and discontinue this one. If not, please advise as to why it's not appropriate so that we may request a Prior Authorization. Please note, some preferred medications may still require a PA.  If the suggested medications have not been trialed and there are no contraindications to their use, the PA will not be submitted, as it will not be approved.   The insurance says the maximum daily dose is 1 tablet.   Could the order be changed to the Mirtazapine  15mg  #30 Take 1/2 to 1 tablet (7.5mg  to 15mg ) by mouth at bedtime for sleep? No PA required.

## 2024-03-08 NOTE — Progress Notes (Signed)
 Travis Barajas                                          MRN: [id number]   03/08/2024   The [facility] Quality Team Specialist reviewed this patient medical record for the purposes of chart review for care gap closure. The following were reviewed: chart review for care gap closure-controlling blood pressure.    [facility] Quality Team

## 2024-03-17 ENCOUNTER — Ambulatory Visit: Admitting: Physician Assistant

## 2024-03-19 DIAGNOSIS — R059 Cough, unspecified: Secondary | ICD-10-CM | POA: Diagnosis not present

## 2024-03-19 DIAGNOSIS — J9811 Atelectasis: Secondary | ICD-10-CM | POA: Diagnosis not present

## 2024-03-19 DIAGNOSIS — Z87891 Personal history of nicotine dependence: Secondary | ICD-10-CM | POA: Diagnosis not present

## 2024-03-19 DIAGNOSIS — J441 Chronic obstructive pulmonary disease with (acute) exacerbation: Secondary | ICD-10-CM | POA: Diagnosis not present

## 2024-03-19 DIAGNOSIS — R0602 Shortness of breath: Secondary | ICD-10-CM | POA: Diagnosis not present

## 2024-03-21 ENCOUNTER — Other Ambulatory Visit: Payer: Self-pay | Admitting: Physician Assistant

## 2024-03-21 DIAGNOSIS — B356 Tinea cruris: Secondary | ICD-10-CM

## 2024-03-27 ENCOUNTER — Ambulatory Visit: Admitting: Physician Assistant

## 2024-03-27 ENCOUNTER — Ambulatory Visit

## 2024-03-29 DIAGNOSIS — I351 Nonrheumatic aortic (valve) insufficiency: Secondary | ICD-10-CM | POA: Diagnosis not present

## 2024-03-29 DIAGNOSIS — I10 Essential (primary) hypertension: Secondary | ICD-10-CM | POA: Diagnosis not present

## 2024-03-29 DIAGNOSIS — I350 Nonrheumatic aortic (valve) stenosis: Secondary | ICD-10-CM | POA: Diagnosis not present

## 2024-03-29 DIAGNOSIS — E782 Mixed hyperlipidemia: Secondary | ICD-10-CM | POA: Diagnosis not present

## 2024-03-29 DIAGNOSIS — Z8673 Personal history of transient ischemic attack (TIA), and cerebral infarction without residual deficits: Secondary | ICD-10-CM | POA: Diagnosis not present

## 2024-04-06 DIAGNOSIS — I878 Other specified disorders of veins: Secondary | ICD-10-CM | POA: Diagnosis not present

## 2024-04-06 DIAGNOSIS — I517 Cardiomegaly: Secondary | ICD-10-CM | POA: Diagnosis not present

## 2024-04-06 DIAGNOSIS — I350 Nonrheumatic aortic (valve) stenosis: Secondary | ICD-10-CM | POA: Diagnosis not present

## 2024-04-28 NOTE — Progress Notes (Signed)
 Travis Barajas                                          MRN: [id number]   04/28/2024   The [facility] Quality Team Specialist reviewed this patient medical record for the purposes of chart review for care gap closure. The following were reviewed: chart review for care gap closure-controlling blood pressure.    [facility] Quality Team

## 2024-05-14 ENCOUNTER — Other Ambulatory Visit: Payer: Self-pay | Admitting: Physician Assistant

## 2024-05-25 NOTE — Progress Notes (Signed)
 Travis Barajas                                          MRN: [id number]   05/25/2024   The [facility] Quality Team Specialist reviewed this patient medical record for the purposes of chart review for care gap closure. The following were reviewed: chart review for care gap closure-controlling blood pressure.    [facility] Quality Team

## 2024-05-29 ENCOUNTER — Other Ambulatory Visit: Payer: Self-pay | Admitting: Physician Assistant

## 2024-05-29 DIAGNOSIS — F419 Anxiety disorder, unspecified: Secondary | ICD-10-CM

## 2024-05-30 NOTE — Telephone Encounter (Signed)
 ALPRAZOLAM  Last OV: 02/18/24 Next OV: No appt scheduled Last RF: 02/19/24

## 2024-06-06 NOTE — Progress Notes (Signed)
 Levan Aloia                                          MRN: [id number]   06/06/2024   The [facility] Quality Team Specialist reviewed this patient medical record for the purposes of chart review for care gap closure. The following were reviewed: chart review for care gap closure-controlling blood pressure.    [facility] Quality Team

## 2024-06-23 NOTE — Progress Notes (Signed)
 Travis Barajas                                          MRN: [id number]   06/23/2024   The [facility] Quality Team Specialist reviewed this patient medical record for the purposes of chart review for care gap closure. The following were reviewed: chart review for care gap closure-controlling blood pressure.    [facility] Quality Team

## 2024-07-02 ENCOUNTER — Other Ambulatory Visit: Payer: Self-pay | Admitting: Physician Assistant

## 2024-07-02 DIAGNOSIS — I10 Essential (primary) hypertension: Secondary | ICD-10-CM

## 2024-07-02 DIAGNOSIS — Z8673 Personal history of transient ischemic attack (TIA), and cerebral infarction without residual deficits: Secondary | ICD-10-CM

## 2024-07-25 ENCOUNTER — Ambulatory Visit: Admitting: Physician Assistant
